# Patient Record
Sex: Female | Born: 1955 | Race: White | Hispanic: No | Smoking: Light tobacco smoker
Health system: Southern US, Community
[De-identification: ages and names within clinical notes are randomized; demographics above are authoritative.]

## PROBLEM LIST (undated history)

## (undated) DIAGNOSIS — R112 Nausea with vomiting, unspecified: Secondary | ICD-10-CM

## (undated) DIAGNOSIS — I2510 Atherosclerotic heart disease of native coronary artery without angina pectoris: Secondary | ICD-10-CM

## (undated) DIAGNOSIS — C50912 Malignant neoplasm of unspecified site of left female breast: Secondary | ICD-10-CM

## (undated) DIAGNOSIS — I509 Heart failure, unspecified: Secondary | ICD-10-CM

## (undated) DIAGNOSIS — Z8489 Family history of other specified conditions: Secondary | ICD-10-CM

## (undated) DIAGNOSIS — Z9889 Other specified postprocedural states: Secondary | ICD-10-CM

## (undated) DIAGNOSIS — I214 Non-ST elevation (NSTEMI) myocardial infarction: Secondary | ICD-10-CM

## (undated) HISTORY — PX: CARDIAC CATHETERIZATION: SHX172

## (undated) HISTORY — PX: GANGLION CYST EXCISION: SHX1691

## (undated) HISTORY — PX: PORTA CATH REMOVAL: CATH118286

---

## 1980-03-14 HISTORY — PX: TUBAL LIGATION: SHX77

## 1986-03-14 HISTORY — PX: VAGINAL HYSTERECTOMY: SUR661

## 2004-08-11 ENCOUNTER — Ambulatory Visit: Payer: Self-pay | Admitting: Family Medicine

## 2004-12-14 ENCOUNTER — Ambulatory Visit: Payer: Self-pay | Admitting: Family Medicine

## 2015-10-13 HISTORY — PX: PORTA CATH INSERTION: CATH118285

## 2015-10-13 HISTORY — PX: BREAST BIOPSY: SHX20

## 2015-10-30 DIAGNOSIS — C50912 Malignant neoplasm of unspecified site of left female breast: Secondary | ICD-10-CM | POA: Diagnosis not present

## 2015-11-21 DIAGNOSIS — R112 Nausea with vomiting, unspecified: Secondary | ICD-10-CM

## 2015-11-21 DIAGNOSIS — A047 Enterocolitis due to Clostridium difficile: Secondary | ICD-10-CM

## 2015-11-21 DIAGNOSIS — E876 Hypokalemia: Secondary | ICD-10-CM | POA: Diagnosis not present

## 2015-11-21 DIAGNOSIS — E871 Hypo-osmolality and hyponatremia: Secondary | ICD-10-CM | POA: Diagnosis not present

## 2015-11-21 DIAGNOSIS — R197 Diarrhea, unspecified: Secondary | ICD-10-CM | POA: Diagnosis not present

## 2015-11-22 DIAGNOSIS — R112 Nausea with vomiting, unspecified: Secondary | ICD-10-CM

## 2015-11-22 DIAGNOSIS — C50812 Malignant neoplasm of overlapping sites of left female breast: Secondary | ICD-10-CM

## 2015-11-22 DIAGNOSIS — E871 Hypo-osmolality and hyponatremia: Secondary | ICD-10-CM

## 2015-11-22 DIAGNOSIS — D701 Agranulocytosis secondary to cancer chemotherapy: Secondary | ICD-10-CM

## 2015-11-22 DIAGNOSIS — Z720 Tobacco use: Secondary | ICD-10-CM

## 2015-11-22 DIAGNOSIS — E876 Hypokalemia: Secondary | ICD-10-CM

## 2015-11-22 DIAGNOSIS — R197 Diarrhea, unspecified: Secondary | ICD-10-CM | POA: Diagnosis not present

## 2015-11-22 DIAGNOSIS — A047 Enterocolitis due to Clostridium difficile: Secondary | ICD-10-CM | POA: Diagnosis not present

## 2015-11-23 DIAGNOSIS — E876 Hypokalemia: Secondary | ICD-10-CM | POA: Diagnosis not present

## 2015-11-23 DIAGNOSIS — A047 Enterocolitis due to Clostridium difficile: Secondary | ICD-10-CM | POA: Diagnosis not present

## 2015-11-23 DIAGNOSIS — E871 Hypo-osmolality and hyponatremia: Secondary | ICD-10-CM | POA: Diagnosis not present

## 2015-11-23 DIAGNOSIS — R197 Diarrhea, unspecified: Secondary | ICD-10-CM | POA: Diagnosis not present

## 2016-01-01 DIAGNOSIS — E876 Hypokalemia: Secondary | ICD-10-CM | POA: Diagnosis not present

## 2016-01-01 DIAGNOSIS — C50812 Malignant neoplasm of overlapping sites of left female breast: Secondary | ICD-10-CM | POA: Diagnosis not present

## 2016-01-01 DIAGNOSIS — G4700 Insomnia, unspecified: Secondary | ICD-10-CM | POA: Diagnosis not present

## 2016-01-22 DIAGNOSIS — C50812 Malignant neoplasm of overlapping sites of left female breast: Secondary | ICD-10-CM | POA: Diagnosis not present

## 2016-01-22 DIAGNOSIS — E876 Hypokalemia: Secondary | ICD-10-CM | POA: Diagnosis not present

## 2016-02-12 DIAGNOSIS — C50812 Malignant neoplasm of overlapping sites of left female breast: Secondary | ICD-10-CM | POA: Diagnosis not present

## 2016-02-12 DIAGNOSIS — L270 Generalized skin eruption due to drugs and medicaments taken internally: Secondary | ICD-10-CM | POA: Diagnosis not present

## 2016-03-11 DIAGNOSIS — L270 Generalized skin eruption due to drugs and medicaments taken internally: Secondary | ICD-10-CM | POA: Diagnosis not present

## 2016-03-11 DIAGNOSIS — I890 Lymphedema, not elsewhere classified: Secondary | ICD-10-CM

## 2016-03-11 DIAGNOSIS — C50812 Malignant neoplasm of overlapping sites of left female breast: Secondary | ICD-10-CM | POA: Diagnosis not present

## 2016-03-11 DIAGNOSIS — R531 Weakness: Secondary | ICD-10-CM

## 2016-03-11 DIAGNOSIS — R269 Unspecified abnormalities of gait and mobility: Secondary | ICD-10-CM

## 2016-03-11 DIAGNOSIS — N390 Urinary tract infection, site not specified: Secondary | ICD-10-CM | POA: Diagnosis not present

## 2016-03-14 DIAGNOSIS — C50912 Malignant neoplasm of unspecified site of left female breast: Secondary | ICD-10-CM

## 2016-03-14 HISTORY — DX: Malignant neoplasm of unspecified site of left female breast: C50.912

## 2016-04-08 DIAGNOSIS — E876 Hypokalemia: Secondary | ICD-10-CM | POA: Diagnosis not present

## 2016-04-08 DIAGNOSIS — C50812 Malignant neoplasm of overlapping sites of left female breast: Secondary | ICD-10-CM | POA: Diagnosis not present

## 2016-04-08 DIAGNOSIS — R531 Weakness: Secondary | ICD-10-CM | POA: Diagnosis not present

## 2016-04-08 DIAGNOSIS — Z87440 Personal history of urinary (tract) infections: Secondary | ICD-10-CM

## 2016-04-08 DIAGNOSIS — I890 Lymphedema, not elsewhere classified: Secondary | ICD-10-CM | POA: Diagnosis not present

## 2016-04-08 DIAGNOSIS — R0602 Shortness of breath: Secondary | ICD-10-CM

## 2016-04-20 HISTORY — PX: MASTECTOMY: SHX3

## 2016-05-04 DIAGNOSIS — R110 Nausea: Secondary | ICD-10-CM | POA: Diagnosis not present

## 2016-05-04 DIAGNOSIS — I890 Lymphedema, not elsewhere classified: Secondary | ICD-10-CM | POA: Diagnosis not present

## 2016-05-04 DIAGNOSIS — Z171 Estrogen receptor negative status [ER-]: Secondary | ICD-10-CM

## 2016-05-04 DIAGNOSIS — E876 Hypokalemia: Secondary | ICD-10-CM | POA: Diagnosis not present

## 2016-05-04 DIAGNOSIS — C50812 Malignant neoplasm of overlapping sites of left female breast: Secondary | ICD-10-CM | POA: Diagnosis not present

## 2016-05-04 DIAGNOSIS — Z9012 Acquired absence of left breast and nipple: Secondary | ICD-10-CM | POA: Diagnosis not present

## 2016-08-02 ENCOUNTER — Telehealth: Payer: Self-pay | Admitting: Cardiovascular Disease

## 2016-08-02 DIAGNOSIS — E786 Lipoprotein deficiency: Secondary | ICD-10-CM

## 2016-08-02 DIAGNOSIS — R062 Wheezing: Secondary | ICD-10-CM

## 2016-08-02 DIAGNOSIS — J189 Pneumonia, unspecified organism: Secondary | ICD-10-CM

## 2016-08-02 DIAGNOSIS — C50919 Malignant neoplasm of unspecified site of unspecified female breast: Secondary | ICD-10-CM

## 2016-08-02 NOTE — Telephone Encounter (Signed)
Received records from [facility] for appointment on 08/04/16 with Dr Gwenlyn Found.  Records put with Dr Kennon Holter schedule for 08/04/16. lp

## 2016-08-03 DIAGNOSIS — E876 Hypokalemia: Secondary | ICD-10-CM

## 2016-08-03 DIAGNOSIS — I509 Heart failure, unspecified: Secondary | ICD-10-CM | POA: Diagnosis not present

## 2016-08-03 DIAGNOSIS — R740 Nonspecific elevation of levels of transaminase and lactic acid dehydrogenase [LDH]: Secondary | ICD-10-CM

## 2016-08-03 DIAGNOSIS — C50919 Malignant neoplasm of unspecified site of unspecified female breast: Secondary | ICD-10-CM | POA: Diagnosis not present

## 2016-08-03 DIAGNOSIS — E786 Lipoprotein deficiency: Secondary | ICD-10-CM | POA: Diagnosis not present

## 2016-08-03 DIAGNOSIS — C50812 Malignant neoplasm of overlapping sites of left female breast: Secondary | ICD-10-CM

## 2016-08-03 DIAGNOSIS — J189 Pneumonia, unspecified organism: Secondary | ICD-10-CM | POA: Diagnosis not present

## 2016-08-03 DIAGNOSIS — J9600 Acute respiratory failure, unspecified whether with hypoxia or hypercapnia: Secondary | ICD-10-CM

## 2016-08-03 DIAGNOSIS — J188 Other pneumonia, unspecified organism: Secondary | ICD-10-CM

## 2016-08-03 DIAGNOSIS — R062 Wheezing: Secondary | ICD-10-CM | POA: Diagnosis not present

## 2016-08-04 ENCOUNTER — Ambulatory Visit: Payer: BLUE CROSS/BLUE SHIELD | Admitting: Cardiovascular Disease

## 2016-08-04 DIAGNOSIS — J189 Pneumonia, unspecified organism: Secondary | ICD-10-CM | POA: Diagnosis not present

## 2016-08-04 DIAGNOSIS — R062 Wheezing: Secondary | ICD-10-CM | POA: Diagnosis not present

## 2016-08-04 DIAGNOSIS — E786 Lipoprotein deficiency: Secondary | ICD-10-CM | POA: Diagnosis not present

## 2016-08-04 DIAGNOSIS — C50919 Malignant neoplasm of unspecified site of unspecified female breast: Secondary | ICD-10-CM | POA: Diagnosis not present

## 2016-08-05 DIAGNOSIS — I509 Heart failure, unspecified: Secondary | ICD-10-CM

## 2016-08-05 DIAGNOSIS — C50919 Malignant neoplasm of unspecified site of unspecified female breast: Secondary | ICD-10-CM | POA: Diagnosis not present

## 2016-08-05 DIAGNOSIS — R062 Wheezing: Secondary | ICD-10-CM | POA: Diagnosis not present

## 2016-08-05 DIAGNOSIS — E786 Lipoprotein deficiency: Secondary | ICD-10-CM | POA: Diagnosis not present

## 2016-08-05 DIAGNOSIS — Z9012 Acquired absence of left breast and nipple: Secondary | ICD-10-CM

## 2016-08-05 DIAGNOSIS — Z9221 Personal history of antineoplastic chemotherapy: Secondary | ICD-10-CM

## 2016-08-05 DIAGNOSIS — C50812 Malignant neoplasm of overlapping sites of left female breast: Secondary | ICD-10-CM | POA: Diagnosis not present

## 2016-08-05 DIAGNOSIS — J189 Pneumonia, unspecified organism: Secondary | ICD-10-CM | POA: Diagnosis not present

## 2016-08-06 DIAGNOSIS — J189 Pneumonia, unspecified organism: Secondary | ICD-10-CM | POA: Diagnosis not present

## 2016-08-06 DIAGNOSIS — C50919 Malignant neoplasm of unspecified site of unspecified female breast: Secondary | ICD-10-CM | POA: Diagnosis not present

## 2016-08-06 DIAGNOSIS — R062 Wheezing: Secondary | ICD-10-CM | POA: Diagnosis not present

## 2016-08-06 DIAGNOSIS — E786 Lipoprotein deficiency: Secondary | ICD-10-CM | POA: Diagnosis not present

## 2016-08-15 DIAGNOSIS — E876 Hypokalemia: Secondary | ICD-10-CM | POA: Diagnosis not present

## 2016-08-15 DIAGNOSIS — R634 Abnormal weight loss: Secondary | ICD-10-CM | POA: Diagnosis not present

## 2016-08-15 DIAGNOSIS — E871 Hypo-osmolality and hyponatremia: Secondary | ICD-10-CM | POA: Diagnosis not present

## 2016-08-15 DIAGNOSIS — C50912 Malignant neoplasm of unspecified site of left female breast: Secondary | ICD-10-CM

## 2016-08-15 DIAGNOSIS — I890 Lymphedema, not elsewhere classified: Secondary | ICD-10-CM | POA: Diagnosis not present

## 2016-08-15 DIAGNOSIS — I509 Heart failure, unspecified: Secondary | ICD-10-CM | POA: Diagnosis not present

## 2016-08-15 DIAGNOSIS — A0472 Enterocolitis due to Clostridium difficile, not specified as recurrent: Secondary | ICD-10-CM | POA: Diagnosis not present

## 2016-08-15 DIAGNOSIS — Z171 Estrogen receptor negative status [ER-]: Secondary | ICD-10-CM | POA: Diagnosis not present

## 2016-08-19 ENCOUNTER — Ambulatory Visit (INDEPENDENT_AMBULATORY_CARE_PROVIDER_SITE_OTHER): Payer: BLUE CROSS/BLUE SHIELD | Admitting: Cardiovascular Disease

## 2016-08-19 ENCOUNTER — Encounter: Payer: Self-pay | Admitting: Cardiovascular Disease

## 2016-08-19 VITALS — BP 114/78 | HR 104 | Ht 63.0 in | Wt 99.4 lb

## 2016-08-19 DIAGNOSIS — R0609 Other forms of dyspnea: Secondary | ICD-10-CM | POA: Diagnosis not present

## 2016-08-19 DIAGNOSIS — I428 Other cardiomyopathies: Secondary | ICD-10-CM | POA: Diagnosis not present

## 2016-08-19 DIAGNOSIS — Z720 Tobacco use: Secondary | ICD-10-CM | POA: Insufficient documentation

## 2016-08-19 MED ORDER — CARVEDILOL 3.125 MG PO TABS: 3.1250 mg | ORAL_TABLET | Freq: Two times a day (BID) | ORAL | 6 refills | Status: DC

## 2016-08-19 NOTE — Patient Instructions (Signed)
Medication Instructions: START Carvedilol 3.125 mg twice daily.   Testing/Procedures: Your physician has requested that you have an echocardiogram. Echocardiography is a painless test that uses sound waves to create images of your heart. It provides your doctor with information about the size and shape of your heart and how well your heart's chambers and valves are working. This procedure takes approximately one hour. There are no restrictions for this procedure.  Follow-Up: You have been referred to CHF Clinic with Dr. Nicholas Lose weeks. They will call you to schedule this appointment.  Your physician recommends that you schedule a follow-up appointment in: 1 month with PharmD to tirate your Carvedilol.   Your physician recommends that you schedule a follow-up appointment in: 3 months with Dr. Gwenlyn Found.  If you need a refill on your cardiac medications before your next appointment, please call your pharmacy.

## 2016-08-19 NOTE — Progress Notes (Signed)
08/19/2016 Ward Givens   1955-12-26  [id number]  Primary Physician Garlon Hatchet, Jaymes Graff, MD Primary Cardiologist: Lorretta Harp MD Renae Gloss  HPI:  Robin Rose is a 61 year old thin-appearing divorced Caucasian female mother of 2, mother of 2 grandchildren who was referred for diagnosis and treatment of probable nonischemic cardiomyopathy. She has no cardiac risk factors although her parents to have coronary artery disease. She was diagnosed with triple-negative breast cancer a year ago and had left mastectomy. Her EF 11/05/15 the city of 55% and by MUGA 04/06/16 was 69%. She is admitted to the hospital 2 weeks ago with orthopnea and heart failure. She was diuresed 8 pounds. Her EF was 20-25% on 08/03/16. She feels clinically improved today. She does weigh herself on a daily basis   Current Outpatient Prescriptions  Medication Sig Dispense Refill  . potassium chloride (KLOR-CON 10) 10 MEQ tablet Take 10 mEq by mouth daily.    . Probiotic Product (PROBIOTIC ADVANCED PO) Take by mouth.    . carvedilol (COREG) 3.125 MG tablet Take 1 tablet (3.125 mg total) by mouth 2 (two) times daily with a meal. 30 tablet 6   No current facility-administered medications for this visit.     Allergies  Allergen Reactions  . Codeine     Other reaction(s): GI Upset (intolerance) Unknown    Social History   Social History  . Marital status: Divorced    Spouse name: N/A  . Number of children: N/A  . Years of education: N/A   Occupational History  . Not on file.   Social History Main Topics  . Smoking status: Current Every Day Smoker  . Smokeless tobacco: Never Used  . Alcohol use Not on file  . Drug use: Unknown  . Sexual activity: Not on file   Other Topics Concern  . Not on file   Social History Narrative  . No narrative on file     Review of Systems: General: negative for chills, fever, night sweats or weight changes.  Cardiovascular: negative for chest pain,  dyspnea on exertion, edema, orthopnea, palpitations, paroxysmal nocturnal dyspnea or shortness of breath Dermatological: negative for rash Respiratory: negative for cough or wheezing Urologic: negative for hematuria Abdominal: negative for nausea, vomiting, diarrhea, bright red blood per rectum, melena, or hematemesis Neurologic: negative for visual changes, syncope, or dizziness All other systems reviewed and are otherwise negative except as noted above.    Blood pressure 114/78, pulse (!) 104, height 5\' 3"  (1.6 m), weight 99 lb 6.4 oz (45.1 kg).  General appearance: alert and no distress Neck: no adenopathy, no carotid bruit, no JVD, supple, symmetrical, trachea midline and thyroid not enlarged, symmetric, no tenderness/mass/nodules Lungs: clear to auscultation bilaterally Heart: Positive S3 summation gallop Extremities: extremities normal, atraumatic, no cyanosis or edema  EKG not performed today  ASSESSMENT AND PLAN:   Tobacco abuse History of 40-pack-years of tobacco abuse continued to smoke one pack per day recalcitrant to risk factor modification  Nonischemic cardiomyopathy (HCC) History of triple-negative breast cancer status post left breast mastectomy, 30 rounds of radiation therapy and chemotherapy. She did get himself 480 g of Adriamycin. Her ejection fraction by 2-D echo 11/05/15 was 50-55% and by MUGA 04/06/16 was 69%. She was recently hospitalized in the hospital for congestive heart failure and was diuresed 8 L. By 2-D echo was 20-25% on 08/03/16. We have talked about salt restriction. She weighs herself daily and emesis remained stable. I'm going to begin her  on low-dose carvedilol and we'll titrate this as an outpatient. She does have a summation gallop on exam. I'm referring her to our heart failure clinic for further evaluation and treatment.      Lorretta Harp MD FACP,FACC,FAHA, [facility] 08/19/2016 11:09 AM

## 2016-08-19 NOTE — Assessment & Plan Note (Signed)
History of 40-pack-years of tobacco abuse continued to smoke one pack per day recalcitrant to risk factor modification

## 2016-08-19 NOTE — Assessment & Plan Note (Signed)
History of triple-negative breast cancer status post left breast mastectomy, 30 rounds of radiation therapy and chemotherapy. She did get himself 480 g of Adriamycin. Her ejection fraction by 2-D echo 11/05/15 was 50-55% and by MUGA 04/06/16 was 69%. She was recently hospitalized in the hospital for congestive heart failure and was diuresed 8 L. By 2-D echo was 20-25% on 08/03/16. We have talked about salt restriction. She weighs herself daily and emesis remained stable. I'm going to begin her on low-dose carvedilol and we'll titrate this as an outpatient. She does have a summation gallop on exam. I'm referring her to our heart failure clinic for further evaluation and treatment.

## 2016-08-29 ENCOUNTER — Encounter (HOSPITAL_COMMUNITY): Payer: Self-pay | Admitting: Internal Medicine

## 2016-08-29 ENCOUNTER — Ambulatory Visit (HOSPITAL_COMMUNITY)
Admission: RE | Admit: 2016-08-29 | Discharge: 2016-08-29 | Disposition: A | Discharge status: Home / Self Care | Payer: BLUE CROSS/BLUE SHIELD | Source: Ambulatory Visit | Attending: Internal Medicine | Admitting: Internal Medicine

## 2016-08-29 VITALS — BP 115/74 | HR 103 | Wt 99.5 lb

## 2016-08-29 DIAGNOSIS — I5022 Chronic systolic (congestive) heart failure: Secondary | ICD-10-CM

## 2016-08-29 DIAGNOSIS — Z841 Family history of disorders of kidney and ureter: Secondary | ICD-10-CM | POA: Diagnosis not present

## 2016-08-29 DIAGNOSIS — Z9889 Other specified postprocedural states: Secondary | ICD-10-CM | POA: Insufficient documentation

## 2016-08-29 DIAGNOSIS — Z8249 Family history of ischemic heart disease and other diseases of the circulatory system: Secondary | ICD-10-CM | POA: Insufficient documentation

## 2016-08-29 DIAGNOSIS — C50912 Malignant neoplasm of unspecified site of left female breast: Secondary | ICD-10-CM

## 2016-08-29 DIAGNOSIS — Z8051 Family history of malignant neoplasm of kidney: Secondary | ICD-10-CM | POA: Insufficient documentation

## 2016-08-29 DIAGNOSIS — F17200 Nicotine dependence, unspecified, uncomplicated: Secondary | ICD-10-CM | POA: Insufficient documentation

## 2016-08-29 DIAGNOSIS — Z9012 Acquired absence of left breast and nipple: Secondary | ICD-10-CM | POA: Insufficient documentation

## 2016-08-29 DIAGNOSIS — Z171 Estrogen receptor negative status [ER-]: Secondary | ICD-10-CM | POA: Diagnosis not present

## 2016-08-29 DIAGNOSIS — Z885 Allergy status to narcotic agent status: Secondary | ICD-10-CM | POA: Diagnosis not present

## 2016-08-29 DIAGNOSIS — Z803 Family history of malignant neoplasm of breast: Secondary | ICD-10-CM | POA: Insufficient documentation

## 2016-08-29 LAB — BASIC METABOLIC PANEL
Anion gap: 10 (ref 5–15)
BUN: 8 mg/dL (ref 6–20)
CALCIUM: 9.2 mg/dL (ref 8.9–10.3)
CO2: 24 mmol/L (ref 22–32)
CREATININE: 0.67 mg/dL (ref 0.44–1.00)
Chloride: 102 mmol/L (ref 101–111)
GFR calc non Af Amer: >60 mL/min (ref >60)
Glucose, Bld: 98 mg/dL (ref 65–99)
Potassium: 4.2 mmol/L (ref 3.5–5.1)
SODIUM: 136 mmol/L (ref 135–145)

## 2016-08-29 MED ORDER — DIGOXIN 125 MCG PO TABS: 0.1250 mg | ORAL_TABLET | Freq: Every day | ORAL | 3 refills | Status: DC

## 2016-08-29 MED ORDER — SPIRONOLACTONE 25 MG PO TABS: 12.5000 mg | ORAL_TABLET | Freq: Every day | ORAL | 3 refills | Status: DC

## 2016-08-29 NOTE — Patient Instructions (Signed)
Labs today (will call for abnormal results, otherwise no news is good news)  START taking Spironolactone 12.5 mg (0.5 Tablet) Once Daily  START taking Digoxin 0.125 mg (1 Tablet) Once Daily  Follow up with Doroteo Bradford, PharmD in 2 weeks  Follow up with Dr. Haroldine Laws in 2 months

## 2016-08-29 NOTE — Progress Notes (Signed)
ADVANCED HF CLINIC CONSULT NOTE  Referring Physician: Gwenlyn Found  Primary Care: Dr. Garlon Hatchet  Primary Cardiologist: Gwenlyn Found   HPI:  [name] is a 61 year old woman with h/o tobacco abuse, breast cancer (s/p chemo and left mastectomy) and recently diagnosed systolic HF who is referred by Dr. Gwenlyn Found for further evaluation of HF.   In 8/17 she was diagnosed with triple-negative breast cancer  with lymph node involvement. She had a clinically staged T4b, N1, M0 cancer which was stage IIIB. Her tumor was ER negative, PR negative and Her-2/neu negative. Her Ki67 was 30%. She underwent neoadjuvant chemotherapy including adriamycin (6 cycles)  followed by left mastectomy in 2/18 and XRT (completed 4/18).   Her EF 11/05/15 was 55% and by MUGA 04/06/16 was 69%. She was admitted to [facility] 08/02/16  with orthopnea and heart failure. She was diuresed 8 pounds. Her EF was 20-25% by echo on 08/03/16. RV was said to be normal. She saw Dr. Gwenlyn Found on 08/19/16. She was started on low-dose carvedilol. Lisinopril and lasix stopped by Dr. Garlon Hatchet on 08/15/16 due to low BP.   Feels fatigued. Mild edema. Takes lasix as needed. SOB with mild exertion. +mild orthopnea. No CP or pressure. Still with periods of dizziness. Says carvedilol does not make her feel worse.    Review of Systems: [y] = yes, _0  = no   General: Weight gain _1 ; Weight loss _2 ; Anorexia _3 ; Fatigue Blue.Reese ]; Fever _4 ; Chills _5 ; Weakness _6   Cardiac: Chest pain/pressure _7 ; Resting SOB _8 ; Exertional SOB _9 ; Orthopnea _10 ; Pedal Edema _11 ; Palpitations _12 ; Syncope _13 ; Presyncope _14 ; Paroxysmal nocturnal dyspnea_15   Pulmonary: Cough _16 ; Wheezing_17 ; Hemoptysis_18 ; Sputum _19 ; Snoring _20   GI: Vomiting_21 ; Dysphagia_22 ; Melena_23 ; Hematochezia _24 ; Heartburn_25 ; Abdominal pain _26 ; Constipation _27 ; Diarrhea _28 ; BRBPR _29   GU: Hematuria_30 ; Dysuria _31 ; Nocturia_32   Vascular: Pain in legs with walking _33 ; Pain in feet with lying flat _34 ;  Non-healing sores _35 ; Stroke _36 ; TIA _37 ; Slurred speech _38 ;  Neuro: Headaches_39 ; Vertigo_40 ; Seizures_41 ; Paresthesias_42 ;Blurred vision _43 ; Diplopia _44 ; Vision changes _45   Ortho/Skin: Arthritis _46 ; Joint pain _47 ; Muscle pain _48 ; Joint swelling _49 ; Back Pain _50 ; Rash _51   Psych: Depression_52 ; Anxiety_53   Heme: Bleeding problems _54 ; Clotting disorders _55 ; Anemia _56   Endocrine: Diabetes _57 ; Thyroid dysfunction_58   PMHx:  . Allergic rhinitis 08/15/2016  . Chronic systolic heart failure ([place]) 08/15/2016  . Malignant neoplasm of overlapping sites of left female breast ([place]) 10/19/2015       Current Outpatient Prescriptions  Medication Sig Dispense Refill  . carvedilol (COREG) 3.125 MG tablet Take 1 tablet (3.125 mg total) by mouth 2 (two) times daily with a meal. 60 tablet 6  . furosemide (LASIX) 20 MG tablet Take 20 mg by mouth daily as needed.    . potassium chloride SA (K-DUR,KLOR-CON) 20 MEQ tablet Take 80 mEq by mouth daily.    . Probiotic Product (PROBIOTIC ADVANCED PO) Take by mouth.     No current facility-administered medications for this encounter.     Allergies  Allergen Reactions  . Codeine     Other reaction(s): GI Upset (intolerance) Unknown      Social History   Social  History  . Marital status: Divorced    Spouse name: N/A  . Number of children: N/A  . Years of education: N/A   Occupational History  . Not on file.   Social History Main Topics  . Smoking status: Current Every Day Smoker  . Smokeless tobacco: Never Used  . Alcohol use Not on file  . Drug use: Unknown  . Sexual activity: Not on file   Other Topics Concern  . Not on file   Social History Narrative  . No narrative on file    Family History   . Heart disease Mother - + CAD with CABG at age 37 . Kidney cancer Mother  . Heart disease Father  - CABG and AVR in 62s . Breast cancer Paternal Aunt  . Breast cancer Cousin  . Colon cancer Neg Hx    - 3 sisters - no CAD        Vitals:   08/29/16 1457  BP: 115/74  Pulse: (!) 103  SpO2: 100%  Weight: 99 lb 8 oz (45.1 kg)    PHYSICAL EXAM: General:  Thin. Well appearing. No respiratory difficulty HEENT: normal Neck: supple. JVP 5. Carotids 2+ bilat; no bruits. No lymphadenopathy or thryomegaly appreciated. Cor: PMI nondisplaced. Tach regular +3s Lungs: clear Abdomen: soft, nontender, nondistended. No hepatosplenomegaly. No bruits or masses. Good bowel sounds. Extremities: no cyanosis, clubbing, rash, mild lymphedema LUE otherwise no edema.  Neuro: alert & oriented x 3, cranial nerves grossly intact. moves all 4 extremities w/o difficulty. Affect pleasant.  ECG (08/02/16): Sinus tach 105. Inferior q waves. Nonspecific ST abnormalities Personally reviewed  Labs: 08/15/16: K 3.3 Cr 0.6   ASSESSMENT & PLAN: 1. Chronic systolic HF - Echo 6/55 EF 20-25%. Likely adriamycin induced but cannot exclude ischemic with h/o tobacco use. Given tobacco history and Q waves on ECG will need R/L heart cath however she would like to hold off for now - NYHA III. Volume status ok - Add digoxin 0.125 and spiro 12.5 - Continue carvedilol as tolerated - Can take lasix as needed for edema  - F/u PharmD clinic in 2 weeks for med titration and possible re-initiation of losartan 12.5 qhs - F/u with me 1-2 months  2. 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 S/p Adjuvant chemotherapy followed by left mastectomy and LN dissection 2/18  3. Tobacco use - Encouraged cessation   [name], MD  3:22 PM

## 2016-08-29 NOTE — Addendum Note (Signed)
Encounter addended by: Kennieth Rad, RN on: 08/29/2016  3:47 PM<BR>    Actions taken: Diagnosis association updated, Order list changed, Sign clinical note

## 2016-09-08 ENCOUNTER — Telehealth (HOSPITAL_COMMUNITY): Payer: Self-pay | Admitting: Pharmacist

## 2016-09-08 ENCOUNTER — Ambulatory Visit (HOSPITAL_COMMUNITY)
Admission: RE | Admit: 2016-09-08 | Discharge: 2016-09-08 | Disposition: A | Discharge status: Home / Self Care | Payer: BLUE CROSS/BLUE SHIELD | Source: Ambulatory Visit | Attending: Cardiology | Admitting: Cardiology

## 2016-09-08 VITALS — BP 116/72 | HR 85 | Wt 98.6 lb

## 2016-09-08 DIAGNOSIS — I5022 Chronic systolic (congestive) heart failure: Secondary | ICD-10-CM | POA: Insufficient documentation

## 2016-09-08 DIAGNOSIS — I428 Other cardiomyopathies: Secondary | ICD-10-CM

## 2016-09-08 DIAGNOSIS — F17210 Nicotine dependence, cigarettes, uncomplicated: Secondary | ICD-10-CM | POA: Diagnosis not present

## 2016-09-08 DIAGNOSIS — C50912 Malignant neoplasm of unspecified site of left female breast: Secondary | ICD-10-CM | POA: Insufficient documentation

## 2016-09-08 DIAGNOSIS — Z9221 Personal history of antineoplastic chemotherapy: Secondary | ICD-10-CM | POA: Insufficient documentation

## 2016-09-08 LAB — BASIC METABOLIC PANEL
Anion gap: 5 (ref 5–15)
BUN: 5 mg/dL — AB (ref 6–20)
CHLORIDE: 102 mmol/L (ref 101–111)
CO2: 32 mmol/L (ref 22–32)
CREATININE: 0.63 mg/dL (ref 0.44–1.00)
Calcium: 9.5 mg/dL (ref 8.9–10.3)
GFR calc Af Amer: >60 mL/min (ref >60)
GLUCOSE: 78 mg/dL (ref 65–99)
POTASSIUM: 3.4 mmol/L — AB (ref 3.5–5.1)
Sodium: 139 mmol/L (ref 135–145)

## 2016-09-08 LAB — BRAIN NATRIURETIC PEPTIDE: B NATRIURETIC PEPTIDE 5: 465 pg/mL — AB (ref 0.0–100.0)

## 2016-09-08 LAB — DIGOXIN LEVEL: DIGOXIN LVL: 1.9 ng/mL (ref 0.8–2.0)

## 2016-09-08 MED ORDER — DIGOXIN 125 MCG PO TABS: 0.0625 mg | ORAL_TABLET | Freq: Every day | ORAL | 3 refills | Status: DC

## 2016-09-08 MED ORDER — LOSARTAN POTASSIUM 25 MG PO TABS: 12.5000 mg | ORAL_TABLET | Freq: Every day | ORAL | 5 refills | Status: DC

## 2016-09-08 NOTE — Telephone Encounter (Signed)
Called patient to inform her that her potassium was slightly low at 3.4 and her digoxin level (drawn appropriately) was above goal at 1.9.   PLAN:  No digoxin tonight, restart tomorrow (6/29) at 0.0625 mg once daily Take KCl 40 mEq x1 today Continue other medications as advised Will follow up BMET and dig level on 09/19/16 pharmacy appointment.   Carlean Jews, Pharm.D. PGY1 Pharmacy Resident 6/28/20184:39 PM Pager [phone]

## 2016-09-08 NOTE — Progress Notes (Signed)
MD: Dr. Haroldine Laws  HPI:  Robin Rose is a pleasant 61 year old caucasian woman with h/o tobacco abuse, breast cancer (s/p chemo and left mastectomy) and recently diagnosed systolic HF who is referred by Dr. Gwenlyn Found for further evaluation of HF.   In 10/2015 she was diagnosed with triple-negative breast cancer  with lymph node involvement. She underwent neoadjuvant chemotherapy including adriamycin (6 cycles)  followed by left mastectomy in 04/2016 and XRT (completed 4/18).   Her EF 11/05/15 was 55% and by MUGA 04/06/16 was 69%. She was admitted to [facility] 08/02/16  with orthopnea and heart failure. She was diuresed 8 pounds. Her EF was 20-25% by echo on 08/03/16. RV was said to be normal. She saw Dr. Gwenlyn Found on 08/19/16, was started on low-dose carvedilol. On 09/04/16, her lisinopril and lasix stopped by Dr. Garlon Hatchet due to low BP.   On last clinic visit with Dr. Haroldine Laws on 08/29/16, patient had SOB with mild exertion and mild orthopnea. Initiated digoxin 0.125 mg daily, spironolactone 12.5 mg daily, and furosemide 20 mg as needed for edema.     Today, she presents for pharmacist-led medication optimization with her sister and brings a medication list. Patient endorses good adherence to her medication regimen, not requiring lasix at this time. Patient endorses a history of requiring aggressive potassium repletion however is not taking any KCl at this time. Patient reports DOE while doing household chores, and some lightheadedness when standing after sitting. Patient reports having an episode of not being able to get up in the grocery store due to weakness, denies dizziness during that episode. She reports having early satiety and some nausea when eating, which mimics the symptoms she experienced when she was fluid overloaded in May, however weight has been stable and patient appears euvolemic on exam.  She reports continued efforts to quit smoking, now down to 1/2 PPD (from 1-2 PPD). Patient states she is  drinking <2L/day and adhering to low sodium diet.     . Shortness of breath/dyspnea on exertion? yes - DOE for household chores, stable . Orthopnea/PND? Yes - sleeps with elevated HOB, stable . Edema? no . Lightheadedness/dizziness? Yes - when standing after sitting . Daily weights at home? Yes, stable ~98-99 lbs . Blood pressure/heart rate monitoring at home? Yes - SBP 90-110s . Following low-sodium/fluid-restricted diet? yes  HF Medications: Carvedilol 3.125 mg tablet BID Digoxin 0.125 mg tablet daily Potassium chloride 10 mEq (4 tablets) when takes furosemide Furosemide 20 mg tablet daily as needed (not taken in past couple of weeks) Spironolactone 12.5 mg daily  Has the patient been experiencing any side effects to the medications prescribed?  Yes - some dizziness  Does the patient have any problems obtaining medications due to transportation or finances?   No - BCBS commercial  Understanding of regimen: good Understanding of indications: good Potential of compliance: excellent Patient understands to avoid NSAIDs. Patient understands to avoid decongestants.    Pertinent Lab Values: . 09/08/2016 Serum creatinine 0.63 (BL~0.6-0.7), CO2 32, Potassium 3.4, Sodium 138, BNP 465, Digoxin 1.9 (last dose on 6/27 at 9 pm)  Vital Signs: . Weight: 98.6 lbs (dry weight: 98-99 lbs) . Blood pressure: 116/72 mmHg  . Heart rate: 85 bpm   Assessment: 1. Chronicsystolic CHF (EF 67-61% on 5/18) likely 2/2 adriamycin but cannot exclude ischemic with h/o tobacco use.  - NYHA class III symptoms - Patient euvolemic on exam  - Initiate losartan 12.5 mg daily. Advised to take in the evening to avoid symptoms of hypotension.  -  Hold digoxin today and restart tomorrow at 0.0625 mg daily and take KCl 40 mEq x 1 today  - Continue spironolactone 12.5 mg daily, carvedilol 3.125 mg BID, furosemide 20 mg daily as needed and potassium 10 mEq (4 tablets) when taking furosemide.    - Basic disease state  pathophysiology, medication indication, mechanism and side effects reviewed at length with patient and she verbalized understanding.  2. Left breast cancer - Triple negative. S/p Adjuvant chemotherapy followed by left mastectomy and LN dissection 2/18 and XRT (completed 4/18) 3. Tobacco use - Patient reports smoking 1/2 PPD - Encouraged smoking cessation   Plan: 1) Medication changes: Based on clinical presentation, vital signs and recent labs will initiate losartan 12.5 mg tablet QHS, decrease digoxin to 0.0625 mg daily starting tomorrow and take KCl 40 mEq x 1 tonight. Advised patient to take carvedilol 3.125 mg BID with food.  2) Labs: BMET, BNP and dig level today  3) Follow-up: 09/19/2016 Pharmacy Clinic and BMET/dig  Patient seen with:  Robin Rose, PharmD, PGY1 Pharmacy Resident Robin Rose, PharmD Candidate    Robin Rose. Velva Harman, PharmD, BCPS, CPP Clinical Pharmacist Pager: [phone] Phone: [phone] 09/08/2016 1:33 PM

## 2016-09-08 NOTE — Patient Instructions (Addendum)
Thanks for coming in to see [place] today!   START taking losartan 12.5 mg (1/2 tablet) once a day. Take this at night.   Continue all of your other medications as you are taking them.  Take your carvedilol with food to reduce dizziness.   We will be drawing labs today and will call you with any issues.   We will see you again on 7/9 to adjust medications and draw labs.

## 2016-09-09 ENCOUNTER — Telehealth (HOSPITAL_COMMUNITY): Payer: Self-pay

## 2016-09-09 NOTE — Telephone Encounter (Addendum)
Mrs. Pickron called this morning to let us know that this morning she feels very lightheaded and dizzy, with a reported BP of 98/58, after starting her new losartan 12.5 mg last night. Since her digoxin level was 1.9, she was instructed to hold her digoxin dose yesterday and start taking 0.0625 mg every day starting today. Instructed patient to hold her losartan a couple of days while she starts taking her new low dose of digoxin, and then to re start her losartan, to see if she can tolerate the losartan after her digoxin levels have a chance to come back down. Patient endorsed understanding with plan.   [place] PharmD Candidate    Agree with above.  Ruta Hinds. Velva Harman, PharmD, BCPS, CPP Clinical Pharmacist Pager: [phone] Phone: [phone] 09/09/2016 1:48 PM

## 2016-09-19 ENCOUNTER — Ambulatory Visit (HOSPITAL_COMMUNITY)
Admission: RE | Admit: 2016-09-19 | Discharge: 2016-09-19 | Disposition: A | Discharge status: Home / Self Care | Payer: BLUE CROSS/BLUE SHIELD | Source: Ambulatory Visit | Attending: Internal Medicine | Admitting: Internal Medicine

## 2016-09-19 ENCOUNTER — Ambulatory Visit: Payer: BLUE CROSS/BLUE SHIELD

## 2016-09-19 VITALS — BP 112/76 | HR 79 | Wt 98.0 lb

## 2016-09-19 DIAGNOSIS — Z9012 Acquired absence of left breast and nipple: Secondary | ICD-10-CM | POA: Insufficient documentation

## 2016-09-19 DIAGNOSIS — Z9221 Personal history of antineoplastic chemotherapy: Secondary | ICD-10-CM | POA: Insufficient documentation

## 2016-09-19 DIAGNOSIS — F17210 Nicotine dependence, cigarettes, uncomplicated: Secondary | ICD-10-CM | POA: Insufficient documentation

## 2016-09-19 DIAGNOSIS — Z853 Personal history of malignant neoplasm of breast: Secondary | ICD-10-CM | POA: Diagnosis not present

## 2016-09-19 DIAGNOSIS — I5022 Chronic systolic (congestive) heart failure: Secondary | ICD-10-CM | POA: Insufficient documentation

## 2016-09-19 DIAGNOSIS — I428 Other cardiomyopathies: Secondary | ICD-10-CM

## 2016-09-19 LAB — BASIC METABOLIC PANEL
ANION GAP: 10 (ref 5–15)
BUN: 7 mg/dL (ref 6–20)
CALCIUM: 9.5 mg/dL (ref 8.9–10.3)
CHLORIDE: 99 mmol/L — AB (ref 101–111)
CO2: 28 mmol/L (ref 22–32)
CREATININE: 0.59 mg/dL (ref 0.44–1.00)
GFR calc non Af Amer: >60 mL/min (ref >60)
Glucose, Bld: 92 mg/dL (ref 65–99)
Potassium: 3.7 mmol/L (ref 3.5–5.1)
SODIUM: 137 mmol/L (ref 135–145)

## 2016-09-19 LAB — DIGOXIN LEVEL

## 2016-09-19 LAB — BRAIN NATRIURETIC PEPTIDE: B NATRIURETIC PEPTIDE 5: 413.5 pg/mL — AB (ref 0.0–100.0)

## 2016-09-19 MED ORDER — VARENICLINE TARTRATE 1 MG PO TABS: 1.0000 mg | ORAL_TABLET | Freq: Two times a day (BID) | ORAL | 5 refills | Status: DC

## 2016-09-19 MED ORDER — VARENICLINE TARTRATE 0.5 MG X 11 & 1 MG X 42 PO MISC: ORAL | 0 refills | Status: DC

## 2016-09-19 NOTE — Progress Notes (Signed)
HF MD: BENSIMHON   HPI:  Robin Rose is a pleasant 61 year old caucasian woman with h/o tobacco abuse, breast cancer (s/p chemo and left mastectomy) and recently diagnosed systolic HF who is referred by Dr. Gwenlyn Found for further evaluation of HF.   In 10/2015 she was diagnosed with triple-negative breast cancer with lymph node involvement. She underwent neoadjuvant chemotherapy including adriamycin (6 cycles) followed byleft mastectomy in 04/2016 and XRT (completed 4/18).   Her EF 11/05/15 was55% and by MUGA 04/06/16 was 69%. She was admitted to [facility] 5/22/18with orthopnea and heart failure. She was diuresed 8 pounds. Her EF was 20-25% by echo on 08/03/16. RV was said to be normal. She saw Dr. Gwenlyn Found on 08/19/16, was started on low-dose carvedilol. On 09/04/16, her lisinopril and lasix stopped by Dr. Garlon Hatchet due to low BP.     Today, she presents for pharmacist-led medication optimization with her sister. At last pharmacy visit, she was started on losartan 12.5 mg daily and her digoxin was reduced to 0.0625 mg daily. Patient endorses good adherence to her medication regimen, not requiring lasix at this time. She states that since starting losartan QHS, her BP has been low and she has felt very dizzy and tired throughout the day. She reports continued efforts to quit smoking, now down to 1/2 PPD (from 1-2 PPD) and is interested in smoking cessation options. She has tried the patch in the past with no luck and she does not chew gum and would prefer not to use the lozenges.     . Shortness of breath/dyspnea on exertion? No  . Orthopnea/PND? Yes (stable) - uses 1-2 pillows  . Edema? No . Lightheadedness/dizziness? Yes - throughout the day, worse than usual since starting losartan  . Daily weights at home? Yes - ~96-98 lb  . Blood pressure/heart rate monitoring at home? Yes - 96/56-126/75 mmHg . Following low-sodium/fluid-restricted diet? Yes  HF Medications: Carvedilol 3.125 mg PO BID Digoxin  0.0625 mg PO daily Losartan 12.5 mg PO daily Spironolactone 12.5 mg PO daily Furosemide 20 mg PO daily PRN weight gain/edema - not needed recently  KCl 40 mEq PO daily PRN with furosemide   Has the patient been experiencing any side effects to the medications prescribed?  Yes - excessive dizziness/hypotension  Does the patient have any problems obtaining medications due to transportation or finances?   No - BCBS commercial  Understanding of regimen: good Understanding of indications: good Potential of compliance: good Patient understands to avoid NSAIDs. Patient understands to avoid decongestants.    Pertinent Lab Values: . 09/19/16: Serum creatinine 0.59, BUN 7, Potassium 3.7, Sodium 137, BNP 413.5, Digoxin <0.2   Vital Signs: . Weight: 98 lb (dry weight: 98-99 lb) . Blood pressure: 112/76 mmHg  . Heart rate: 79 bpm    Assessment: 1. Chronicsystolic CHF (EF 27-06% on 5/18) likely 2/2 adriamycin but cannot exclude ischemic with h/o tobacco use.  - NYHA class III symptoms - Patient euvolemic on exam  - D/c losartan with worsening dizziness/hypotension - Continue digoxin 0.0625 mg daily, spironolactone 12.5 mg daily, carvedilol 3.125 mg BID, furosemide 20 mg daily as needed and potassium 10 mEq (4 tablets) when taking furosemide.    - Basic disease state pathophysiology, medication indication, mechanism and side effects reviewed at length with patient and she verbalized understanding.  2. Left breast cancer - Triple negative. S/p Adjuvant chemotherapy followed by left mastectomy and LN dissection 2/18 and XRT (completed 4/18) 3. Tobacco use - Patient reports still smoking 1/2 PPD -  Interested in smoking cessation options so will start Chantix today  Plan: 1) Medication changes: Based on clinical presentation, vital signs and recent labs will d/c losartan and start Chantix for smoking cessation 2) Labs: BMET, BNP, digoxin today 3) Follow-up: Dr. Haroldine Laws on 11/04/16   Ruta Hinds. Velva Harman, PharmD, BCPS, CPP Clinical Pharmacist Pager: [phone] Phone: [phone] 09/19/2016 3:06 PM

## 2016-09-19 NOTE — Patient Instructions (Addendum)
It was great to see you today!  Please STOP your losartan for the time being.   Please START Chantix 0.5 mg tablet by mouth once daily for 3 days, then increase to one 0.5 mg tablet twice daily for 4 days, then increase to one 1 mg tablet twice daily.  Labs today. We will call you with any abnormalities.   Please keep your appointment with Dr. Haroldine Laws on 11/04/16.

## 2016-09-28 DIAGNOSIS — C50912 Malignant neoplasm of unspecified site of left female breast: Secondary | ICD-10-CM | POA: Diagnosis not present

## 2016-09-28 DIAGNOSIS — R634 Abnormal weight loss: Secondary | ICD-10-CM | POA: Diagnosis not present

## 2016-09-28 DIAGNOSIS — R110 Nausea: Secondary | ICD-10-CM

## 2016-09-28 DIAGNOSIS — Z923 Personal history of irradiation: Secondary | ICD-10-CM | POA: Diagnosis not present

## 2016-09-28 DIAGNOSIS — I509 Heart failure, unspecified: Secondary | ICD-10-CM | POA: Diagnosis not present

## 2016-09-28 DIAGNOSIS — Z9221 Personal history of antineoplastic chemotherapy: Secondary | ICD-10-CM | POA: Diagnosis not present

## 2016-09-28 DIAGNOSIS — I890 Lymphedema, not elsewhere classified: Secondary | ICD-10-CM | POA: Diagnosis not present

## 2016-09-28 DIAGNOSIS — Z9012 Acquired absence of left breast and nipple: Secondary | ICD-10-CM | POA: Diagnosis not present

## 2016-10-06 ENCOUNTER — Telehealth (HOSPITAL_COMMUNITY): Payer: Self-pay

## 2016-10-06 NOTE — Telephone Encounter (Signed)
Patient called CHF clinic to report new onset n/v since starting Chantix last Monday. Advised per Oda Kilts PA-C to stop chantix and see if symptoms subside over the next couple of days. If not, advised to contact PCP to further eval. Patient aware and agreeable.  Renee Pain, RN

## 2016-11-04 ENCOUNTER — Ambulatory Visit (HOSPITAL_COMMUNITY)
Admission: RE | Admit: 2016-11-04 | Discharge: 2016-11-04 | Disposition: A | Discharge status: Home / Self Care | Payer: BLUE CROSS/BLUE SHIELD | Source: Ambulatory Visit | Attending: Internal Medicine | Admitting: Internal Medicine

## 2016-11-04 ENCOUNTER — Encounter (HOSPITAL_COMMUNITY): Payer: Self-pay | Admitting: Internal Medicine

## 2016-11-04 ENCOUNTER — Other Ambulatory Visit (HOSPITAL_COMMUNITY): Payer: Self-pay | Admitting: *Deleted

## 2016-11-04 ENCOUNTER — Encounter (HOSPITAL_COMMUNITY): Payer: Self-pay | Admitting: *Deleted

## 2016-11-04 VITALS — BP 101/65 | HR 85 | Wt 95.8 lb

## 2016-11-04 DIAGNOSIS — Z8249 Family history of ischemic heart disease and other diseases of the circulatory system: Secondary | ICD-10-CM | POA: Diagnosis not present

## 2016-11-04 DIAGNOSIS — I5022 Chronic systolic (congestive) heart failure: Secondary | ICD-10-CM | POA: Diagnosis not present

## 2016-11-04 DIAGNOSIS — Z9221 Personal history of antineoplastic chemotherapy: Secondary | ICD-10-CM | POA: Insufficient documentation

## 2016-11-04 DIAGNOSIS — F17210 Nicotine dependence, cigarettes, uncomplicated: Secondary | ICD-10-CM | POA: Diagnosis not present

## 2016-11-04 DIAGNOSIS — C50912 Malignant neoplasm of unspecified site of left female breast: Secondary | ICD-10-CM | POA: Diagnosis not present

## 2016-11-04 DIAGNOSIS — Z9012 Acquired absence of left breast and nipple: Secondary | ICD-10-CM | POA: Insufficient documentation

## 2016-11-04 DIAGNOSIS — I952 Hypotension due to drugs: Secondary | ICD-10-CM | POA: Diagnosis not present

## 2016-11-04 LAB — BASIC METABOLIC PANEL
Anion gap: 9 (ref 5–15)
BUN: 6 mg/dL (ref 6–20)
CALCIUM: 9.6 mg/dL (ref 8.9–10.3)
CHLORIDE: 101 mmol/L (ref 101–111)
CO2: 27 mmol/L (ref 22–32)
CREATININE: 0.62 mg/dL (ref 0.44–1.00)
Glucose, Bld: 114 mg/dL — ABNORMAL HIGH (ref 65–99)
Potassium: 4 mmol/L (ref 3.5–5.1)
SODIUM: 137 mmol/L (ref 135–145)

## 2016-11-04 LAB — CBC
HCT: 47.5 % — ABNORMAL HIGH (ref 36.0–46.0)
Hemoglobin: 16.2 g/dL — ABNORMAL HIGH (ref 12.0–15.0)
MCH: 32 pg (ref 26.0–34.0)
MCHC: 34.1 g/dL (ref 30.0–36.0)
MCV: 93.9 fL (ref 78.0–100.0)
Platelets: 221 10*3/uL (ref 150–400)
RBC: 5.06 MIL/uL (ref 3.87–5.11)
RDW: 13.8 % (ref 11.5–15.5)
WBC: 6.9 10*3/uL (ref 4.0–10.5)

## 2016-11-04 LAB — PROTIME-INR
INR: 1
PROTHROMBIN TIME: 13.2 s (ref 11.4–15.2)

## 2016-11-04 NOTE — Addendum Note (Signed)
Encounter addended by: Darron Doom, RN on: 11/04/2016 11:06 AM<BR>    Actions taken: Sign clinical note

## 2016-11-04 NOTE — Addendum Note (Signed)
Encounter addended by: Darron Doom, RN on: 11/04/2016 11:09 AM<BR>    Actions taken: Order list changed, Diagnosis association updated

## 2016-11-04 NOTE — Patient Instructions (Signed)
You have been scheduled for a Right & Left Heart Catheter, please see attached instructions.

## 2016-11-04 NOTE — Progress Notes (Signed)
[facility]  NOTE  Referring Physician: Gwenlyn Found  Primary Care: Dr. Garlon Hatchet  Primary Cardiologist: Gwenlyn Found   HPI:  Robin Rose is a 61 year old woman with h/o tobacco abuse, breast cancer (s/p chemo and left mastectomy) and recently diagnosed systolic HF who is referred by Dr. Gwenlyn Found for further evaluation of HF.   In 8/17 she was diagnosed with triple-negative breast cancer  with lymph node involvement. She had a clinically staged T4b, N1, M0 cancer which was stage IIIB. Her tumor was ER negative, PR negative and Her-2/neu negative. Her Ki67 was 30%. She underwent neoadjuvant chemotherapy including adriamycin (6 cycles)  followed by left mastectomy in 2/18 and XRT (completed 4/18).   Her EF 11/05/15 was 55% and by MUGA 04/06/16 was 69%. She was admitted to [facility] 08/02/16  with orthopnea and heart failure. She was diuresed 8 pounds. Her EF was 20-25% by echo on 08/03/16. RV was said to be normal. She saw Dr. Gwenlyn Found on 08/19/16. She was started on low-dose carvedilol. Lisinopril and lasix stopped by Dr. Garlon Hatchet on 08/15/16 due to low BP.   Here for f/u. Has been following in the HF PharmD. Unfortunately unable to tolerate even low-dose losartan due to hypotension. Continues to feel weak and easily fatigued. Can do ADLs but wipes her out and SOB with just washing dishes. Weight dropping now 95 pounds. No edema. + orthopnea. Not taking any lasix.  We suggested cath previously but she refused. Smoking 1/2 PPD. Failed Chantix.     PMHx:  . Allergic rhinitis 08/15/2016  . Chronic systolic heart failure (Robin Rose) 08/15/2016  . Malignant neoplasm of overlapping sites of left female breast (Robin Rose) 10/19/2015      Current Outpatient Prescriptions  Medication Sig Dispense Refill  . carvedilol (COREG) 3.125 MG tablet Take 1 tablet (3.125 mg total) by mouth 2 (two) times daily with a meal. 60 tablet 6  . digoxin (LANOXIN) 0.125 MG tablet Take 0.5 tablets (0.0625 mg total) by mouth daily. 45 tablet 3    . diphenhydrAMINE (BENADRYL) 25 mg capsule Take 25 mg by mouth every 6 (six) hours as needed for allergies.    . furosemide (LASIX) 20 MG tablet Take 20 mg by mouth daily as needed (swelling).     . potassium chloride (K-DUR,KLOR-CON) 10 MEQ tablet Take 40 mEq by mouth as needed. With furosemide    . Probiotic Product (PROBIOTIC ADVANCED PO) Take 1 capsule by mouth daily.     Marland Kitchen spironolactone (ALDACTONE) 25 MG tablet Take 0.5 tablets (12.5 mg total) by mouth daily. 45 tablet 3   No current facility-administered medications for this encounter.     Allergies  Allergen Reactions  . Codeine     Other reaction(s): GI Upset (intolerance) Unknown      Social History   Social History  . Marital status: Divorced    Spouse name: N/A  . Number of children: N/A  . Years of education: N/A   Occupational History  . Not on file.   Social History Main Topics  . Smoking status: Current Every Day Smoker  . Smokeless tobacco: Never Used  . Alcohol use Not on file  . Drug use: Unknown  . Sexual activity: Not on file   Other Topics Concern  . Not on file   Social History Narrative  . No narrative on file    Family History   . Heart disease Mother - + CAD with CABG at age 65 . Kidney cancer Mother  . Heart  disease Father  - CABG and AVR in 39s . Breast cancer Paternal Aunt  . Breast cancer Cousin  . Colon cancer Neg Hx    - 3 sisters - no CAD       Vitals:   11/04/16 1014  BP: 101/65  Pulse: 85  SpO2: 100%  Weight: 95 lb 12.8 oz (43.5 kg)    PHYSICAL EXAM: General:  Thin weak appearing.  No resp difficulty HEENT: normal Neck: supple. no JVD. Carotids 2+ bilat; no bruits. No lymphadenopathy or thryomegaly appreciated. Cor: PMI laterally displaced. Regular rate & rhythm. No rubs, gallops or murmurs. R chest port-cath Lungs: clear with decreased BS Abdomen: soft, nontender, nondistended. No hepatosplenomegaly. No bruits or masses. Good bowel sounds. Extremities: no  cyanosis, clubbing, rash, edema Neuro: alert & orientedx3, cranial nerves grossly intact. moves all 4 extremities w/o difficulty. Affect pleasant   Labs: 08/15/16: K 3.3 Cr 0.6   ASSESSMENT & PLAN: 1. Chronic systolic HF - Echo 7/58 EF 20-25%. Likely adriamycin induced but cannot exclude ischemic with h/o tobacco use and Q waves on ECG - Continues to struggle with NYHA IIIB symptoms. Med titration severely limited by hypotension - Volume status ok. Concern for low output.  - NYHA III. Volume status ok - Continue digoxin 0.125 and spiro 12.5 and carvedilol 3.125 bid - Can take lasix as needed for edema  - Plan R/L cath 2. 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 S/p Adjuvant chemotherapy followed by left mastectomy and LN dissection 2/18 and XRT. Now complete. 3. Tobacco use - Encouraged cessation. Failed Chantix.   [name], MD  10:44 AM

## 2016-11-07 ENCOUNTER — Encounter (HOSPITAL_COMMUNITY)
Admission: RE | Disposition: A | Discharge status: Home / Self Care | Payer: Self-pay | Source: Ambulatory Visit | Attending: Internal Medicine

## 2016-11-07 ENCOUNTER — Ambulatory Visit (HOSPITAL_COMMUNITY)
Admission: RE | Admit: 2016-11-07 | Discharge: 2016-11-07 | Disposition: A | Discharge status: Home / Self Care | Payer: BLUE CROSS/BLUE SHIELD | Source: Ambulatory Visit | Attending: Internal Medicine | Admitting: Internal Medicine

## 2016-11-07 DIAGNOSIS — C50912 Malignant neoplasm of unspecified site of left female breast: Secondary | ICD-10-CM | POA: Diagnosis not present

## 2016-11-07 DIAGNOSIS — Z803 Family history of malignant neoplasm of breast: Secondary | ICD-10-CM | POA: Insufficient documentation

## 2016-11-07 DIAGNOSIS — Z171 Estrogen receptor negative status [ER-]: Secondary | ICD-10-CM | POA: Diagnosis not present

## 2016-11-07 DIAGNOSIS — J309 Allergic rhinitis, unspecified: Secondary | ICD-10-CM | POA: Insufficient documentation

## 2016-11-07 DIAGNOSIS — Z79899 Other long term (current) drug therapy: Secondary | ICD-10-CM | POA: Diagnosis not present

## 2016-11-07 DIAGNOSIS — Z885 Allergy status to narcotic agent status: Secondary | ICD-10-CM | POA: Insufficient documentation

## 2016-11-07 DIAGNOSIS — Z8051 Family history of malignant neoplasm of kidney: Secondary | ICD-10-CM | POA: Diagnosis not present

## 2016-11-07 DIAGNOSIS — F17200 Nicotine dependence, unspecified, uncomplicated: Secondary | ICD-10-CM | POA: Diagnosis not present

## 2016-11-07 DIAGNOSIS — I5022 Chronic systolic (congestive) heart failure: Secondary | ICD-10-CM | POA: Diagnosis not present

## 2016-11-07 DIAGNOSIS — Z9221 Personal history of antineoplastic chemotherapy: Secondary | ICD-10-CM | POA: Insufficient documentation

## 2016-11-07 DIAGNOSIS — I2510 Atherosclerotic heart disease of native coronary artery without angina pectoris: Secondary | ICD-10-CM | POA: Diagnosis present

## 2016-11-07 DIAGNOSIS — C773 Secondary and unspecified malignant neoplasm of axilla and upper limb lymph nodes: Secondary | ICD-10-CM | POA: Diagnosis not present

## 2016-11-07 DIAGNOSIS — Z9012 Acquired absence of left breast and nipple: Secondary | ICD-10-CM | POA: Diagnosis not present

## 2016-11-07 DIAGNOSIS — Z8249 Family history of ischemic heart disease and other diseases of the circulatory system: Secondary | ICD-10-CM | POA: Insufficient documentation

## 2016-11-07 HISTORY — PX: RIGHT/LEFT HEART CATH AND CORONARY ANGIOGRAPHY: CATH118266

## 2016-11-07 LAB — POCT ACTIVATED CLOTTING TIME: Activated Clotting Time: 136 seconds

## 2016-11-07 LAB — POCT I-STAT 3, VENOUS BLOOD GAS (G3P V)
ACID-BASE EXCESS: 2 mmol/L (ref 0.0–2.0)
ACID-BASE EXCESS: 3 mmol/L — AB (ref 0.0–2.0)
BICARBONATE: 28 mmol/L (ref 20.0–28.0)
Bicarbonate: 28.2 mmol/L — ABNORMAL HIGH (ref 20.0–28.0)
O2 SAT: 60 %
O2 Saturation: 58 %
PCO2 VEN: 45.4 mmHg (ref 44.0–60.0)
PO2 VEN: 31 mmHg — AB (ref 32.0–45.0)
TCO2: 29 mmol/L (ref 22–32)
TCO2: 30 mmol/L (ref 22–32)
pCO2, Ven: 45.6 mmHg (ref 44.0–60.0)
pH, Ven: 7.396 (ref 7.250–7.430)
pH, Ven: 7.402 (ref 7.250–7.430)
pO2, Ven: 31 mmHg — CL (ref 32.0–45.0)

## 2016-11-07 LAB — POCT I-STAT 3, ART BLOOD GAS (G3+)
Acid-Base Excess: 1 mmol/L (ref 0.0–2.0)
BICARBONATE: 25.7 mmol/L (ref 20.0–28.0)
O2 SAT: 92 %
PCO2 ART: 38.7 mmHg (ref 32.0–48.0)
PH ART: 7.43 (ref 7.350–7.450)
PO2 ART: 63 mmHg — AB (ref 83.0–108.0)
TCO2: 27 mmol/L (ref 22–32)

## 2016-11-07 SURGERY — RIGHT/LEFT HEART CATH AND CORONARY ANGIOGRAPHY
Anesthesia: LOCAL

## 2016-11-07 MED ORDER — SODIUM CHLORIDE 0.9 % IV SOLN
INTRAVENOUS | Status: DC
  Administered 2016-11-07: 09:00:00 via INTRAVENOUS

## 2016-11-07 MED ORDER — LIDOCAINE HCL (PF) 1 % IJ SOLN
INTRAMUSCULAR | Status: AC
  Filled 2016-11-07: qty 30

## 2016-11-07 MED ORDER — HEPARIN SODIUM (PORCINE) 1000 UNIT/ML IJ SOLN
INTRAMUSCULAR | Status: AC
  Filled 2016-11-07: qty 1

## 2016-11-07 MED ORDER — SODIUM CHLORIDE 0.9% FLUSH: 3.0000 mL | Freq: Two times a day (BID) | INTRAVENOUS | Status: DC

## 2016-11-07 MED ORDER — SODIUM CHLORIDE 0.9 % IV SOLN: 250.0000 mL | INTRAVENOUS | Status: DC | PRN

## 2016-11-07 MED ORDER — HEPARIN (PORCINE) IN NACL 2-0.9 UNIT/ML-% IJ SOLN
INTRAMUSCULAR | Status: AC | PRN
  Administered 2016-11-07: 1000 mL

## 2016-11-07 MED ORDER — SODIUM CHLORIDE 0.9 % IV SOLN: INTRAVENOUS | Status: AC

## 2016-11-07 MED ORDER — ONDANSETRON HCL 4 MG/2ML IJ SOLN: 4.0000 mg | Freq: Four times a day (QID) | INTRAMUSCULAR | Status: DC | PRN

## 2016-11-07 MED ORDER — HEPARIN SODIUM (PORCINE) 1000 UNIT/ML IJ SOLN
INTRAMUSCULAR | Status: DC | PRN
  Administered 2016-11-07: 1000 [IU] via INTRAVENOUS

## 2016-11-07 MED ORDER — ASPIRIN 81 MG PO CHEW
CHEWABLE_TABLET | ORAL | Status: AC
  Filled 2016-11-07: qty 1

## 2016-11-07 MED ORDER — ASPIRIN 81 MG PO CHEW
81.0000 mg | CHEWABLE_TABLET | ORAL | Status: AC
  Administered 2016-11-07: 81 mg via ORAL

## 2016-11-07 MED ORDER — SODIUM CHLORIDE 0.9% FLUSH: 3.0000 mL | INTRAVENOUS | Status: DC | PRN

## 2016-11-07 MED ORDER — VERAPAMIL HCL 2.5 MG/ML IV SOLN
INTRAVENOUS | Status: DC | PRN
  Administered 2016-11-07: 12:00:00 via INTRA_ARTERIAL

## 2016-11-07 MED ORDER — LIDOCAINE HCL (PF) 1 % IJ SOLN
INTRAMUSCULAR | Status: DC | PRN
  Administered 2016-11-07: 2 mL
  Administered 2016-11-07: 15 mL

## 2016-11-07 MED ORDER — VERAPAMIL HCL 2.5 MG/ML IV SOLN
INTRAVENOUS | Status: AC
  Filled 2016-11-07: qty 2

## 2016-11-07 MED ORDER — IOPAMIDOL (ISOVUE-370) INJECTION 76%
INTRAVENOUS | Status: AC
  Filled 2016-11-07: qty 100

## 2016-11-07 MED ORDER — FENTANYL CITRATE (PF) 100 MCG/2ML IJ SOLN
INTRAMUSCULAR | Status: AC
  Filled 2016-11-07: qty 2

## 2016-11-07 MED ORDER — IOPAMIDOL (ISOVUE-370) INJECTION 76%
INTRAVENOUS | Status: DC | PRN
  Administered 2016-11-07: 75 mL via INTRA_ARTERIAL

## 2016-11-07 MED ORDER — ACETAMINOPHEN 325 MG PO TABS: 650.0000 mg | ORAL_TABLET | ORAL | Status: DC | PRN

## 2016-11-07 MED ORDER — HEPARIN (PORCINE) IN NACL 2-0.9 UNIT/ML-% IJ SOLN
INTRAMUSCULAR | Status: AC
  Filled 2016-11-07: qty 1000

## 2016-11-07 MED ORDER — MIDAZOLAM HCL 2 MG/2ML IJ SOLN
INTRAMUSCULAR | Status: AC
  Filled 2016-11-07: qty 2

## 2016-11-07 MED ORDER — MIDAZOLAM HCL 2 MG/2ML IJ SOLN
INTRAMUSCULAR | Status: DC | PRN
  Administered 2016-11-07: 1 mg via INTRAVENOUS

## 2016-11-07 MED ORDER — FENTANYL CITRATE (PF) 100 MCG/2ML IJ SOLN
INTRAMUSCULAR | Status: DC | PRN
  Administered 2016-11-07: 25 ug via INTRAVENOUS

## 2016-11-07 SURGICAL SUPPLY — 14 items
CATH INFINITI 5FR MULTPACK ANG (CATHETERS) ×1 IMPLANT
CATH SWAN GANZ 7F STRAIGHT (CATHETERS) ×1 IMPLANT
DEVICE RAD TR BAND REGULAR (VASCULAR PRODUCTS) ×1 IMPLANT
GLIDESHEATH SLEND SS 6F .021 (SHEATH) ×1 IMPLANT
KIT HEART LEFT (KITS) ×2 IMPLANT
PACK CARDIAC CATHETERIZATION (CUSTOM PROCEDURE TRAY) ×2 IMPLANT
SHEATH PINNACLE 5F 10CM (SHEATH) ×1 IMPLANT
SHEATH PINNACLE 7F 10CM (SHEATH) ×2 IMPLANT
SYR MEDRAD MARK V 150ML (SYRINGE) ×2 IMPLANT
TRANSDUCER W/STOPCOCK (MISCELLANEOUS) ×2 IMPLANT
TUBING CIL FLEX 10 FLL-RA (TUBING) ×2 IMPLANT
WIRE EMERALD 3MM-J .025X260CM (WIRE) ×1 IMPLANT
WIRE EMERALD 3MM-J .035X150CM (WIRE) ×1 IMPLANT
WIRE HI TORQ VERSACORE-J 145CM (WIRE) ×1 IMPLANT

## 2016-11-07 NOTE — Progress Notes (Signed)
Site area: rt fem art 84fr Rt fem vein 73fr Site Prior to Removal:  Level 0 Pressure Applied For:57min Manual:   yes Patient Status During Pull:  A/O Post Pull Site:  Level 0 Post Pull Instructions Given: yes, pt has been informed and understands   Post Pull Pulses Present: 2+ rt pt Dressing Applied:  tegaderm and a 4x4 Bedrest begins  @ 13:30:00 Comments:Pty leaves cath lab procedure room in stable condition. Rt groin unremarkable. Dressing is CDI.

## 2016-11-07 NOTE — Interval H&P Note (Signed)
History and Physical Interval Note:  11/07/2016 11:23 AM  Robin Rose  has presented today for surgery, with the diagnosis of chf  The various methods of treatment have been discussed with the patient and family. After consideration of risks, benefits and other options for treatment, the patient has consented to  Procedure(s): RIGHT/LEFT HEART CATH AND CORONARY ANGIOGRAPHY (N/A) and possible coronary angioplasty as a surgical intervention .  The patient's history has been reviewed, patient examined, no change in status, stable for surgery.  I have reviewed the patient's chart and labs.  Questions were answered to the patient's satisfaction.     Maimouna Rondeau, Quillian Quince

## 2016-11-07 NOTE — H&P (View-Only) (Signed)
 [facility]  NOTE  Referring Physician: Berry  Primary Care: Dr. Dough  Primary Cardiologist: Berry   HPI:  Robin Rose is a 61-year-old woman with h/o tobacco abuse, breast cancer (s/p chemo and left mastectomy) and recently diagnosed systolic HF who is referred by Dr. Berry for further evaluation of HF.   In 8/17 she was diagnosed with triple-negative breast cancer  with lymph node involvement. She had a clinically staged T4b, N1, M0 cancer which was stage IIIB. Her tumor was ER negative, PR negative and Her-2/neu negative. Her Ki67 was 30%. She underwent neoadjuvant chemotherapy including adriamycin (6 cycles)  followed by left mastectomy in 2/18 and XRT (completed 4/18).   Her EF 11/05/15 was 55% and by MUGA 04/06/16 was 69%. She was admitted to [facility] 08/02/16  with orthopnea and heart failure. She was diuresed 8 pounds. Her EF was 20-25% by echo on 08/03/16. RV was said to be normal. She saw Dr. Berry on 08/19/16. She was started on low-dose carvedilol. Lisinopril and lasix stopped by Dr. Dough on 08/15/16 due to low BP.   Here for f/u. Has been following in the HF PharmD. Unfortunately unable to tolerate even low-dose losartan due to hypotension. Continues to feel weak and easily fatigued. Can do ADLs but wipes her out and SOB with just washing dishes. Weight dropping now 95 pounds. No edema. + orthopnea. Not taking any lasix.  We suggested cath previously but she refused. Smoking 1/2 PPD. Failed Chantix.     PMHx:  . Allergic rhinitis 08/15/2016  . Chronic systolic heart failure ([facility]) 08/15/2016  . Malignant neoplasm of overlapping sites of left female breast ([facility]) 10/19/2015      Current Outpatient Prescriptions  Medication Sig Dispense Refill  . carvedilol (COREG) 3.125 MG tablet Take 1 tablet (3.125 mg total) by mouth 2 (two) times daily with a meal. 60 tablet 6  . digoxin (LANOXIN) 0.125 MG tablet Take 0.5 tablets (0.0625 mg total) by mouth daily. 45 tablet 3    . diphenhydrAMINE (BENADRYL) 25 mg capsule Take 25 mg by mouth every 6 (six) hours as needed for allergies.    . furosemide (LASIX) 20 MG tablet Take 20 mg by mouth daily as needed (swelling).     . potassium chloride (K-DUR,KLOR-CON) 10 MEQ tablet Take 40 mEq by mouth as needed. With furosemide    . Probiotic Product (PROBIOTIC ADVANCED PO) Take 1 capsule by mouth daily.     . spironolactone (ALDACTONE) 25 MG tablet Take 0.5 tablets (12.5 mg total) by mouth daily. 45 tablet 3   No current facility-administered medications for this encounter.     Allergies  Allergen Reactions  . Codeine     Other reaction(s): GI Upset (intolerance) Unknown      Social History   Social History  . Marital status: Divorced    Spouse name: N/A  . Number of children: N/A  . Years of education: N/A   Occupational History  . Not on file.   Social History Main Topics  . Smoking status: Current Every Day Smoker  . Smokeless tobacco: Never Used  . Alcohol use Not on file  . Drug use: Unknown  . Sexual activity: Not on file   Other Topics Concern  . Not on file   Social History Narrative  . No narrative on file    Family History   . Heart disease Mother - + CAD with CABG at age 84 . Kidney cancer Mother  . Heart   disease Father  - CABG and AVR in 70s . Breast cancer Paternal Aunt  . Breast cancer Cousin  . Colon cancer Neg Hx    - 3 sisters - no CAD       Vitals:   11/04/16 1014  BP: 101/65  Pulse: 85  SpO2: 100%  Weight: 95 lb 12.8 oz (43.5 kg)    PHYSICAL EXAM: General:  Thin weak appearing.  No resp difficulty HEENT: normal Neck: supple. no JVD. Carotids 2+ bilat; no bruits. No lymphadenopathy or thryomegaly appreciated. Cor: PMI laterally displaced. Regular rate & rhythm. No rubs, gallops or murmurs. R chest port-cath Lungs: clear with decreased BS Abdomen: soft, nontender, nondistended. No hepatosplenomegaly. No bruits or masses. Good bowel sounds. Extremities: no  cyanosis, clubbing, rash, edema Neuro: alert & orientedx3, cranial nerves grossly intact. moves all 4 extremities w/o difficulty. Affect pleasant   Labs: 08/15/16: K 3.3 Cr 0.6   ASSESSMENT & PLAN: 1. Chronic systolic HF - Echo 5/18 EF 20-25%. Likely adriamycin induced but cannot exclude ischemic with h/o tobacco use and Q waves on ECG - Continues to struggle with NYHA IIIB symptoms. Med titration severely limited by hypotension - Volume status ok. Concern for low output.  - NYHA III. Volume status ok - Continue digoxin 0.125 and spiro 12.5 and carvedilol 3.125 bid - Can take lasix as needed for edema  - Plan R/L cath 2. 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 S/p Adjuvant chemotherapy followed by left mastectomy and LN dissection 2/18 and XRT. Now complete. 3. Tobacco use - Encouraged cessation. Failed Chantix.   Bensimhon, Daniel, MD  10:44 AM   

## 2016-11-07 NOTE — Discharge Instructions (Signed)
Radial Site Care Refer to this sheet in the next few weeks. These instructions provide you with information about caring for yourself after your procedure. Your health care provider may also give you more specific instructions. Your treatment has been planned according to current medical practices, but problems sometimes occur. Call your health care provider if you have any problems or questions after your procedure. What can I expect after the procedure? After your procedure, it is typical to have the following:  Bruising at the radial site that usually fades within 1-2 weeks.  Blood collecting in the tissue (hematoma) that may be painful to the touch. It should usually decrease in size and tenderness within 1-2 weeks.  Follow these instructions at home:  Take medicines only as directed by your health care provider.  You may shower 24-48 hours after the procedure or as directed by your health care provider. Remove the bandage (dressing) and gently wash the site with plain soap and water. Pat the area dry with a clean towel. Do not rub the site, because this may cause bleeding.  Do not take baths, swim, or use a hot tub until your health care provider approves.  Check your insertion site every day for redness, swelling, or drainage.  Do not apply powder or lotion to the site.  Do not flex or bend the affected arm for 24 hours or as directed by your health care provider.  Do not push or pull heavy objects with the affected arm for 24 hours or as directed by your health care provider.  Do not lift over 10 lb (4.5 kg) for 5 days after your procedure or as directed by your health care provider.  Ask your health care provider when it is okay to: ? Return to work or school. ? Resume usual physical activities or sports. ? Resume sexual activity.  Do not drive home if you are discharged the same day as the procedure. Have someone else drive you.  You may drive 24 hours after the procedure  unless otherwise instructed by your health care provider.  Do not operate machinery or power tools for 24 hours after the procedure.  If your procedure was done as an outpatient procedure, which means that you went home the same day as your procedure, a responsible adult should be with you for the first 24 hours after you arrive home.  Keep all follow-up visits as directed by your health care provider. This is important. Contact a health care provider if:  You have a fever.  You have chills.  You have increased bleeding from the radial site. Hold pressure on the site. CALL 911 Get help right away if:  You have unusual pain at the radial site.  You have redness, warmth, or swelling at the radial site.  You have drainage (other than a small amount of blood on the dressing) from the radial site.  The radial site is bleeding, and the bleeding does not stop after 30 minutes of holding steady pressure on the site.  Your arm or hand becomes pale, cool, tingly, or numb. This information is not intended to replace advice given to you by your health care provider. Make sure you discuss any questions you have with your health care provider. Document Released: 04/02/2010 Document Revised: 08/06/2015 Document Reviewed: 09/16/2013 Elsevier Interactive Patient Education  2018 [employer] This sheet gives you information about how to care for yourself after your procedure. Your health care provider may also give  you more specific instructions. If you have problems or questions, contact your health care provider. What can I expect after the procedure? After the procedure, it is common to have bruising and tenderness at the catheter insertion area. Follow these instructions at home: Insertion site care  Follow instructions from your health care provider about how to take care of your insertion site. Make sure you: ? Wash your hands with soap and water before you change your  bandage (dressing). If soap and water are not available, use hand sanitizer. ? Change your dressing as told by your health care provider. ? Leave stitches (sutures), skin glue, or adhesive strips in place. These skin closures may need to stay in place for 2 weeks or longer. If adhesive strip edges start to loosen and curl up, you may trim the loose edges. Do not remove adhesive strips completely unless your health care provider tells you to do that.  Do not take baths, swim, or use a hot tub until your health care provider approves.  You may shower 24-48 hours after the procedure or as told by your health care provider. ? Gently wash the site with plain soap and water. ? Pat the area dry with a clean towel. ? Do not rub the site. This may cause bleeding.  Do not apply powder or lotion to the site. Keep the site clean and dry.  Check your insertion site every day for signs of infection. Check for: ? Redness, swelling, or pain. ? Fluid or blood. ? Warmth. ? Pus or a bad smell. Activity  Rest as told by your health care provider, usually for 1-2 days.  Do not lift anything that is heavier than 10 lbs. (4.5 kg) or as told by your health care provider.  Do not drive for 24 hours if you were given a medicine to help you relax (sedative).  Do not drive or use heavy machinery while taking prescription pain medicine. General instructions  Return to your normal activities as told by your health care provider, usually in about a week. Ask your health care provider what activities are safe for you.  If the catheter site starts bleeding, lie flat and put pressure on the site. If the bleeding does not stop, get help right away. This is a medical emergency.  Drink enough fluid to keep your urine clear or pale yellow. This helps flush the contrast dye from your body.  Take over-the-counter and prescription medicines only as told by your health care provider.  Keep all follow-up visits as told by  your health care provider. This is important. Contact a health care provider if:  You have a fever or chills.  You have redness, swelling, or pain around your insertion site.  You have fluid or blood coming from your insertion site.  The insertion site feels warm to the touch.  You have pus or a bad smell coming from your insertion site.  You have bruising around the insertion site.  You notice blood collecting in the tissue around the catheter site (hematoma). The hematoma may be painful to the touch. Get help right away if:  You have severe pain at the catheter insertion area.  The catheter insertion area swells very fast.  The catheter insertion area is bleeding, and the bleeding does not stop when you hold steady pressure on the area.  The area near or just beyond the catheter insertion site becomes pale, cool, tingly, or numb. These symptoms may represent a serious problem  that is an emergency. Do not wait to see if the symptoms will go away. Get medical help right away. Call your local emergency services (911 in the [place] Do not drive yourself to the hospital. Summary  After the procedure, it is common to have bruising and tenderness at the catheter insertion area.  After the procedure, it is important to rest and drink plenty of fluids.  Do not take baths, swim, or use a hot tub until your health care provider says it is okay to do so. You may shower 24-48 hours after the procedure or as told by your health care provider.  If the catheter site starts bleeding, lie flat and put pressure on the site. If the bleeding does not stop, get help right away. This is a medical emergency. This information is not intended to replace advice given to you by your health care provider. Make sure you discuss any questions you have with your health care provider. Document Released: 09/16/2004 Document Revised: 02/03/2016 Document Reviewed: 02/03/2016 Elsevier Interactive Patient Education   2017 [employer].

## 2016-11-08 ENCOUNTER — Encounter (HOSPITAL_COMMUNITY): Payer: Self-pay | Admitting: Internal Medicine

## 2016-11-18 ENCOUNTER — Ambulatory Visit (HOSPITAL_COMMUNITY): Payer: BLUE CROSS/BLUE SHIELD | Attending: Cardiology

## 2016-11-18 ENCOUNTER — Other Ambulatory Visit: Payer: Self-pay

## 2016-11-18 DIAGNOSIS — Z9012 Acquired absence of left breast and nipple: Secondary | ICD-10-CM | POA: Diagnosis not present

## 2016-11-18 DIAGNOSIS — Z853 Personal history of malignant neoplasm of breast: Secondary | ICD-10-CM | POA: Insufficient documentation

## 2016-11-18 DIAGNOSIS — I428 Other cardiomyopathies: Secondary | ICD-10-CM | POA: Diagnosis present

## 2016-11-18 DIAGNOSIS — R0609 Other forms of dyspnea: Secondary | ICD-10-CM | POA: Diagnosis present

## 2016-11-18 DIAGNOSIS — Z9221 Personal history of antineoplastic chemotherapy: Secondary | ICD-10-CM | POA: Insufficient documentation

## 2016-11-18 DIAGNOSIS — Z720 Tobacco use: Secondary | ICD-10-CM | POA: Insufficient documentation

## 2016-11-22 ENCOUNTER — Ambulatory Visit (INDEPENDENT_AMBULATORY_CARE_PROVIDER_SITE_OTHER): Payer: BLUE CROSS/BLUE SHIELD | Admitting: Cardiovascular Disease

## 2016-11-22 ENCOUNTER — Encounter: Payer: Self-pay | Admitting: Cardiovascular Disease

## 2016-11-22 DIAGNOSIS — I255 Ischemic cardiomyopathy: Secondary | ICD-10-CM | POA: Diagnosis not present

## 2016-11-22 DIAGNOSIS — Z720 Tobacco use: Secondary | ICD-10-CM

## 2016-11-22 DIAGNOSIS — I739 Peripheral vascular disease, unspecified: Secondary | ICD-10-CM | POA: Diagnosis not present

## 2016-11-22 MED ORDER — BUPROPION HCL 100 MG PO TABS: ORAL_TABLET | ORAL | 1 refills | Status: DC

## 2016-11-22 NOTE — Assessment & Plan Note (Signed)
Ongoing tobacco abuse recalcitrant risk factor modification. 

## 2016-11-22 NOTE — Patient Instructions (Addendum)
Medication Instructions: Your physician recommends that you continue on your current medications as directed. Please refer to the Current Medication list given to you today.  Start Wellbutrin (Bupropion) 100 mg--Take 150 mg (1 &1/2 tablets) daily for 3 days. Then Increase to 1& 1/2 tablets (150 mg) twice daily.   Follow-Up: Your physician recommends that you schedule a follow-up appointment in: 3 months with Dr. Gwenlyn Found.  If you need a refill on your cardiac medications before your next appointment, please call your pharmacy.

## 2016-11-22 NOTE — Assessment & Plan Note (Signed)
This Is has an EF of 20-25% range by recent echo performed on 11/18/16 unchanged from prior echo. She is on low-dose carvedilol. She could not tolerate lisinopril because of hypotension. Initial impression was that her reduced EF was related to chemotherapy from her breast CA. Right left heart catheter showed severe three-vessel disease with fairly low filling pressures. I suspect that she would benefit from coronary artery bypass grafting. I will discuss with Dr. Jeffie Pollock.

## 2016-11-22 NOTE — Progress Notes (Signed)
11/22/2016 Ward Givens   02-08-1956  [id number]  Primary Physician Garlon Hatchet, Jaymes Graff, MD Primary Cardiologist: Lorretta Harp MD Lupe Carney, [place]  HPI:  Robin Rose is a 61 y.o. female thin-appearing divorced Caucasian female mother of 2, mother of 2 grandchildren who was referred for diagnosis and treatment of probable nonischemic cardiomyopathy. I last saw her in the office 08/19/16. She has no cardiac risk factors although her parents to have coronary artery disease. She was diagnosed with triple-negative breast cancer a year ago and had left mastectomy. Her EF 11/05/15 the city of 55% and by [facility] 04/06/16 was 69%. She is admitted to the hospital 2 weeks ago with orthopnea and heart failure. She was diuresed 8 pounds. Her EF was 20-25% on 08/03/16. She feels clinically improved today. She does weigh herself on a daily basis. I referred her to Dr. Haroldine Laws  who performed a right and left heart cath on her 11/07/16. She had severe three-vessel disease with low filling pressures. In addition, she did have an incidentally noted occluded right common iliac artery although she is asymptomatic from this.    Current Meds  Medication Sig  . acetaminophen (TYLENOL) 500 MG tablet Take 1,000 mg by mouth 2 (two) times daily as needed for moderate pain or headache.  . carvedilol (COREG) 3.125 MG tablet Take 1 tablet (3.125 mg total) by mouth 2 (two) times daily with a meal.  . digoxin (LANOXIN) 0.125 MG tablet Take 0.5 tablets (0.0625 mg total) by mouth daily.  . diphenhydrAMINE (BENADRYL) 25 mg capsule Take 25 mg by mouth every 6 (six) hours as needed for allergies.  . furosemide (LASIX) 20 MG tablet Take 20 mg by mouth 4 (four) times daily as needed (swelling).   . ondansetron (ZOFRAN) 4 MG tablet Take 4 mg by mouth every 4 (four) hours as needed for nausea/vomiting.  Marland Kitchen oxymetazoline (AFRIN) 0.05 % nasal spray Place 1 spray into both nostrils at bedtime as needed for congestion.  .  potassium chloride (K-DUR,KLOR-CON) 10 MEQ tablet Take 40 mEq by mouth daily as needed (swelling). With first dose of furosemide  . Probiotic Product (PROBIOTIC ADVANCED PO) Take 1 capsule by mouth daily.   Marland Kitchen spironolactone (ALDACTONE) 25 MG tablet Take 0.5 tablets (12.5 mg total) by mouth daily. (Patient taking differently: Take 12.5 mg by mouth at bedtime. )     Allergies  Allergen Reactions  . Chantix [Varenicline] Nausea And Vomiting  . Codeine     GI Upset, headaches      Social History   Social History  . Marital status: Divorced    Spouse name: N/A  . Number of children: N/A  . Years of education: N/A   Occupational History  . Not on file.   Social History Main Topics  . Smoking status: Current Every Day Smoker  . Smokeless tobacco: Never Used  . Alcohol use Not on file  . Drug use: Unknown  . Sexual activity: Not on file   Other Topics Concern  . Not on file   Social History Narrative  . No narrative on file     Review of Systems: General: negative for chills, fever, night sweats or weight changes.  Cardiovascular: negative for chest pain, dyspnea on exertion, edema, orthopnea, palpitations, paroxysmal nocturnal dyspnea or shortness of breath Dermatological: negative for rash Respiratory: negative for cough or wheezing Urologic: negative for hematuria Abdominal: negative for nausea, vomiting, diarrhea, bright red blood per rectum, melena, or hematemesis  Neurologic: negative for visual changes, syncope, or dizziness All other systems reviewed and are otherwise negative except as noted above.    Blood pressure 108/68, pulse 84, height 5\' 3"  (1.6 m), weight 95 lb (43.1 kg).  General appearance: alert and no distress Neck: no adenopathy, no carotid bruit, no JVD, supple, symmetrical, trachea midline and thyroid not enlarged, symmetric, no tenderness/mass/nodules Lungs: clear to auscultation bilaterally Heart: regular rate and rhythm, S1, S2 normal, no murmur,  click, rub or gallop Extremities: extremities normal, atraumatic, no cyanosis or edema  EKG Not  performed today  ASSESSMENT AND PLAN:   Tobacco abuse Ongoing tobacco abuse recalcitrant risk factor modification  Peripheral arterial disease (HCC) Occluded right common iliac artery reconstituting in the external iliac although asymptomatic. This could be percutaneously addressable if clinically indicated.  Ischemic cardiomyopathy This Is has an EF of 20-25% range by recent echo performed on 11/18/16 unchanged from prior echo. She is on low-dose carvedilol. She could not tolerate lisinopril because of hypotension. Initial impression was that her reduced EF was related to chemotherapy from her breast CA. Right left heart catheter showed severe three-vessel disease with fairly low filling pressures. I suspect that she would benefit from coronary artery bypass grafting. I will discuss with Dr. Jeffie Pollock.       Lorretta Harp MD FACP,FACC,FAHA, FSCAI 11/22/2016 12:00 PM

## 2016-11-22 NOTE — Assessment & Plan Note (Signed)
Occluded right common iliac artery reconstituting in the external iliac although asymptomatic. This could be percutaneously addressable if clinically indicated.

## 2016-11-22 NOTE — Addendum Note (Signed)
Addended by: Therisa Doyne on: 11/22/2016 12:11 PM   Modules accepted: Orders

## 2016-12-01 ENCOUNTER — Telehealth: Payer: Self-pay | Admitting: Cardiovascular Disease

## 2016-12-01 NOTE — Telephone Encounter (Signed)
Left message for patient, dr berry is in the cath lab today. I have left a message asking for him to call me to discuss when he is free.

## 2016-12-01 NOTE — Telephone Encounter (Signed)
New message    Pt is calling about her last appt. She said she is calling to find out what is going on. She said Dr. Gwenlyn Found was going to talk to Dr. Haroldine Laws about what to do and she has not heard anything.

## 2016-12-01 NOTE — Telephone Encounter (Signed)
Spoke with pt, according to dr berry and bensimhon, they are waiting to hear back from dr Lucianne Lei tright. Someone will be getting in touch with her.

## 2016-12-03 ENCOUNTER — Other Ambulatory Visit: Payer: Self-pay | Admitting: Cardiovascular Disease

## 2016-12-05 NOTE — Telephone Encounter (Signed)
Rx has been sent to the pharmacy electronically. ° °

## 2016-12-06 ENCOUNTER — Encounter: Payer: BLUE CROSS/BLUE SHIELD | Admitting: Cardiothoracic Surgery

## 2016-12-07 ENCOUNTER — Encounter: Payer: Self-pay | Admitting: Cardiothoracic Surgery

## 2016-12-07 ENCOUNTER — Institutional Professional Consult (permissible substitution) (INDEPENDENT_AMBULATORY_CARE_PROVIDER_SITE_OTHER): Payer: BLUE CROSS/BLUE SHIELD | Admitting: Cardiothoracic Surgery

## 2016-12-07 VITALS — BP 106/79 | HR 101 | Resp 16 | Ht 63.0 in | Wt 93.0 lb

## 2016-12-07 DIAGNOSIS — I2510 Atherosclerotic heart disease of native coronary artery without angina pectoris: Secondary | ICD-10-CM | POA: Diagnosis not present

## 2016-12-07 NOTE — Progress Notes (Signed)
PCP is Dough, Jaymes Graff, MD Referring Provider is Bensimhon, Shaune Pascal, MD  Chief Complaint  Patient presents with  . Coronary Artery Disease    eval for CABG...CATH 11/07/16, ECHO 11/18/16   Patient examined, coronary angiograms and 2-D echocardiogram and CT scan of chest and right heart cath hemodynamic data all personally reviewed and discussed with patient.  HPI: 61 year old female treated for advanced carcinoma of the left breast within the past year-first with chemotherapy then left modified radical mastectomy and then radiation to the left chest. She was given Adriamycin with subsequent drop in her ejection fraction from 60% to 50% down to 25%. With this she has had associated weight loss from 112-93 pounds, and decline in functional status and inability to perform the activities of daily living without significant effort. She underwent coronary angiogram showing three-vessel coronary disease and ejection fraction of 25%. LVEDP was 15. Her PA pressures were normal and cardiac index by Fick 1.8. On echo her LV diameter was 5 cm with global hypokinesia.  The patient has been treated at the advanced heart failure clinic with heart failure meds with minimal symptomatic improvement. The patient describes shortness of breath and fatigue with activity but denies chest pain.  The patient underwent general anesthesia and left mastectomy without cardiac complications earlier this year. This was performed prior to radiation therapy. There were positive axillary nodes involved.  The patient has a positive family history of coronary disease-both parents of had CABG.  The patient was found to have a right iliac artery occlusion at the time of catheterization and had right radial artery cannulation.   Her coronary vessels are small in the 1 mm range, poor targets for grafting. The patient has radiation skin changes in her left chest and her left chest also has a severe pectus deformity. CT scan shows COPD  from smoking history. Overall she appears very frail and and poor general condition and would be a Poor candidate for CABG.  No past medical history on file.  Past Surgical History:  Procedure Laterality Date  . RIGHT/LEFT HEART CATH AND CORONARY ANGIOGRAPHY N/A 11/07/2016   Procedure: RIGHT/LEFT HEART CATH AND CORONARY ANGIOGRAPHY;  Surgeon: Jolaine Artist, MD;  Location: [facility];  Service: Cardiovascular;  Laterality: N/A;    No family history on file.  Social History Social History  Substance Use Topics  . Smoking status: Former Smoker    Packs/day: 1.00    Years: 40.00    Types: Cigarettes    Quit date: 11/23/2016  . Smokeless tobacco: Never Used     Comment: HAS STOPPED SMOKING X 2 WKS!!!!!  . Alcohol use Not on file    Current Outpatient Prescriptions  Medication Sig Dispense Refill  . acetaminophen (TYLENOL) 500 MG tablet Take 1,000 mg by mouth 2 (two) times daily as needed for moderate pain or headache.    Marland Kitchen buPROPion (WELLBUTRIN) 100 MG tablet Take 1 & 1/2 tablets by mouth once daily for 3 days. Then increase to 1 & 1/2 tablets twice daily. 180 tablet 1  . carvedilol (COREG) 3.125 MG tablet TAKE 1 TABLET (3.125 MG TOTAL) BY MOUTH 2 (TWO) TIMES DAILY WITH A MEAL. 90 tablet 2  . digoxin (LANOXIN) 0.125 MG tablet Take 0.5 tablets (0.0625 mg total) by mouth daily. 45 tablet 3  . diphenhydrAMINE (BENADRYL) 25 mg capsule Take 25 mg by mouth every 6 (six) hours as needed for allergies.    . furosemide (LASIX) 20 MG tablet Take 20 mg  by mouth 4 (four) times daily as needed (swelling).     . ondansetron (ZOFRAN) 4 MG tablet Take 4 mg by mouth every 4 (four) hours as needed for nausea/vomiting.  2  . oxymetazoline (AFRIN) 0.05 % nasal spray Place 1 spray into both nostrils at bedtime as needed for congestion.    . potassium chloride (K-DUR,KLOR-CON) 10 MEQ tablet Take 40 mEq by mouth daily as needed (swelling). With first dose of furosemide    . Probiotic Product  (PROBIOTIC ADVANCED PO) Take 1 capsule by mouth daily.     Marland Kitchen spironolactone (ALDACTONE) 25 MG tablet Take 0.5 tablets (12.5 mg total) by mouth daily. (Patient taking differently: Take 12.5 mg by mouth at bedtime. ) 45 tablet 3   No current facility-administered medications for this visit.     Allergies  Allergen Reactions  . Chantix [Varenicline] Nausea And Vomiting  . Codeine     GI Upset, headaches      Review of Systems  Weight loss Poor exercise tolerance Minimal edema Poor appetite No orthopnea or PND No sick be or falls No abdominal pain or bleeding  BP 106/79 (BP Location: Right Arm, Patient Position: Sitting, Cuff Size: Normal)   Pulse (!) 101   Resp 16   Ht 5\' 3"  (1.6 m)   Wt 93 lb (42.2 kg)   SpO2 98% Comment: ON RA  BMI 16.47 kg/m  Physical Exam     Physical Exam  General: Middle-aged female very frail and chronically ill-appearing HEENT: Normocephalic pupils equal , dentition adequate Neck: Supple without JVD, adenopathy, or bruit Chest: Clear to auscultation, symmetrical breath sounds, no rhonchi, no tenderness. Severe radiation skin changes over left chest with severe pectus deformity as well.             Cardiovascular: Regular rate and rhythm, tachycardic, no murmur, no gallop, peripheral pulses are not             palpable in  lower extremities Abdomen:  Soft, nontender, no palpable mass or organomegaly Extremities: Warm, well-perfused, no clubbing cyanosis edema or tenderness,              no venous stasis changes of the legs but small saphenous vein size Rectal/GU: Deferred Neuro: Grossly non--focal and symmetrical throughout Skin: Clean and dry without rash or ulceration      Impression: The patient is a poor candidate for CABG with poor targets, poor LV function, poor general health and deconditioning and with significant chest wall problems that would prevent adequate healing including radiation dermatitis and severe pectus  deformity  Plan: Continue medical therapy for her CAD Len Childs, MD [facility] [phone]

## 2016-12-08 NOTE — Progress Notes (Signed)
December is fine unless she has bypass surgery in which case I will see her 4-6 weeks post bypass

## 2016-12-13 ENCOUNTER — Telehealth: Payer: Self-pay | Admitting: Cardiovascular Disease

## 2016-12-13 ENCOUNTER — Encounter (HOSPITAL_COMMUNITY): Payer: Self-pay | Admitting: *Deleted

## 2016-12-13 NOTE — Telephone Encounter (Signed)
ECHOCARDIOGRAM COMPLETE  Order: [id number]  Status:  Final result Visible to patient:  Yes (MyChart) Dx:  DOE (dyspnea on exertion); Nonischemi...  Notes recorded by Therisa Doyne on 12/07/2016 at 9:17 AM EDT Pt has appt with Dr. Prescott Gum today. She is currently scheduled to see you in December. Would you like to see her sooner? ------  Notes recorded by Lorretta Harp, MD on 11/20/2016 at 4:25 PM EDT EF unchanged since June. Needs ROV to discuss

## 2016-12-13 NOTE — Telephone Encounter (Deleted)
-----   Message from Lorretta Harp, MD sent at 11/20/2016  4:25 PM EDT ----- EF unchanged since June. Needs ROV to discuss

## 2016-12-13 NOTE — Progress Notes (Signed)
Received medical records request from [facility] on 12/06/18-18 CASE # [id number]  Requested records faxed today to [phone]  Original request will be scanned to patient's electronic medical record.

## 2016-12-15 NOTE — Telephone Encounter (Signed)
Dec is fine unless she is symptomatic

## 2016-12-19 DIAGNOSIS — I509 Heart failure, unspecified: Secondary | ICD-10-CM | POA: Diagnosis not present

## 2016-12-19 DIAGNOSIS — Z9012 Acquired absence of left breast and nipple: Secondary | ICD-10-CM | POA: Diagnosis not present

## 2016-12-19 DIAGNOSIS — Z171 Estrogen receptor negative status [ER-]: Secondary | ICD-10-CM | POA: Diagnosis not present

## 2016-12-19 DIAGNOSIS — Z9221 Personal history of antineoplastic chemotherapy: Secondary | ICD-10-CM | POA: Diagnosis not present

## 2016-12-19 DIAGNOSIS — I890 Lymphedema, not elsewhere classified: Secondary | ICD-10-CM | POA: Diagnosis not present

## 2016-12-19 DIAGNOSIS — Z923 Personal history of irradiation: Secondary | ICD-10-CM | POA: Diagnosis not present

## 2016-12-19 DIAGNOSIS — C50912 Malignant neoplasm of unspecified site of left female breast: Secondary | ICD-10-CM | POA: Diagnosis not present

## 2016-12-19 DIAGNOSIS — R918 Other nonspecific abnormal finding of lung field: Secondary | ICD-10-CM | POA: Diagnosis not present

## 2016-12-19 DIAGNOSIS — R634 Abnormal weight loss: Secondary | ICD-10-CM | POA: Diagnosis not present

## 2016-12-30 ENCOUNTER — Encounter: Payer: Self-pay | Admitting: Pulmonary Disease

## 2016-12-30 ENCOUNTER — Ambulatory Visit (INDEPENDENT_AMBULATORY_CARE_PROVIDER_SITE_OTHER): Payer: BLUE CROSS/BLUE SHIELD | Admitting: Pulmonary Disease

## 2016-12-30 DIAGNOSIS — R918 Other nonspecific abnormal finding of lung field: Secondary | ICD-10-CM | POA: Diagnosis not present

## 2016-12-30 NOTE — Patient Instructions (Signed)
I have reviewed his CT scan.  There are very small lung nodules that are likely benign. I do not see any clear evidence of MAI infection. We will continue to monitor this with a CT scan next year We will schedule pulmonary function test for better evaluation of your emphysema Follow-up in clinic in 3 months.

## 2016-12-30 NOTE — Progress Notes (Signed)
Robin Rose    [id number]    02/14/1956  Primary Care Physician:Dough, Jaymes Graff, MD  Referring Physician: [name], MD [address] [place], [place] [address]  Chief complaint: Consult for evaluation of MAC infection, abnormal CT scan  HPI: 61 year old with history of cardiomyopathy, breast cancer She was treated for left breast cancer with chemotherapy and radiation [last radiation dose was April 2018].  She underwent mastectomy in February 2018.  She is also followed for ischemic cardiomyopathy with EF 20-25%.  She is not a candidate for surgery and is being managed medically.  She has had CT scans of the chest which shows scattered subcentimeter pulmonary nodules and has been referred for further evaluation, possible MAI infection  Chief complaint is dyspnea with activity.  She denies any cough, sputum production, fevers, chills, hemoptysis.  Occupation: Retired Optometrist Exposures: No known exposures Smoking history: 30-pack-year smoking history.  Quit 1 month ago Travel History: Not significant  Outpatient Encounter Prescriptions as of 12/30/2016  Medication Sig  . acetaminophen (TYLENOL) 500 MG tablet Take 1,000 mg by mouth 2 (two) times daily as needed for moderate pain or headache.  Marland Kitchen buPROPion (WELLBUTRIN) 100 MG tablet Take 1 & 1/2 tablets by mouth once daily for 3 days. Then increase to 1 & 1/2 tablets twice daily.  . carvedilol (COREG) 3.125 MG tablet TAKE 1 TABLET (3.125 MG TOTAL) BY MOUTH 2 (TWO) TIMES DAILY WITH A MEAL.  Marland Kitchen digoxin (LANOXIN) 0.125 MG tablet Take 0.5 tablets (0.0625 mg total) by mouth daily.  . diphenhydrAMINE (BENADRYL) 25 mg capsule Take 25 mg by mouth every 6 (six) hours as needed for allergies.  . furosemide (LASIX) 20 MG tablet Take 20 mg by mouth 4 (four) times daily as needed (swelling).   . ondansetron (ZOFRAN) 4 MG tablet Take 4 mg by mouth every 4 (four) hours as needed for nausea/vomiting.  Marland Kitchen oxymetazoline (AFRIN) 0.05 %  nasal spray Place 1 spray into both nostrils at bedtime as needed for congestion.  . potassium chloride (K-DUR,KLOR-CON) 10 MEQ tablet Take 40 mEq by mouth daily as needed (swelling). With first dose of furosemide  . Probiotic Product (PROBIOTIC ADVANCED PO) Take 1 capsule by mouth daily.   Marland Kitchen spironolactone (ALDACTONE) 25 MG tablet Take 0.5 tablets (12.5 mg total) by mouth daily. (Patient taking differently: Take 12.5 mg by mouth at bedtime. )   No facility-administered encounter medications on file as of 12/30/2016.     Allergies as of 12/30/2016 - Review Complete 12/30/2016  Allergen Reaction Noted  . Chantix [varenicline] Nausea And Vomiting 11/04/2016  . Codeine  09/29/2015    No past medical history on file.  Past Surgical History:  Procedure Laterality Date  . RIGHT/LEFT HEART CATH AND CORONARY ANGIOGRAPHY N/A 11/07/2016   Procedure: RIGHT/LEFT HEART CATH AND CORONARY ANGIOGRAPHY;  Surgeon: Jolaine Artist, MD;  Location: [facility];  Service: Cardiovascular;  Laterality: N/A;    No family history on file.  Social History   Social History  . Marital status: Divorced    Spouse name: N/A  . Number of children: N/A  . Years of education: N/A   Occupational History  . Not on file.   Social History Main Topics  . Smoking status: Former Smoker    Packs/day: 1.00    Years: 40.00    Types: Cigarettes    Quit date: 11/23/2016  . Smokeless tobacco: Never Used     Comment: HAS  STOPPED SMOKING X 2 WKS!!!!!  . Alcohol use Not on file  . Drug use: Unknown  . Sexual activity: Not on file   Other Topics Concern  . Not on file   Social History Narrative  . No narrative on file   Review of systems: Review of Systems  Constitutional: Negative for fever and chills.  HENT: Negative.   Eyes: Negative for blurred vision.  Respiratory: as per HPI  Cardiovascular: Negative for chest pain and palpitations.  Gastrointestinal: Negative for vomiting, diarrhea, blood per  rectum. Genitourinary: Negative for dysuria, urgency, frequency and hematuria.  Musculoskeletal: Negative for myalgias, back pain and joint pain.  Skin: Negative for itching and rash.  Neurological: Negative for dizziness, tremors, focal weakness, seizures and loss of consciousness.  Endo/Heme/Allergies: Negative for environmental allergies.  Psychiatric/Behavioral: Negative for depression, suicidal ideas and hallucinations.  All other systems reviewed and are negative.  Physical Exam: Blood pressure 118/70, pulse 81, height 5' 3"  (1.6 m), weight 92 lb 6 oz (41.9 kg), SpO2 98 %. Gen:      No acute distress HEENT:  EOMI, sclera anicteric Neck:     No masses; no thyromegaly Lungs:    Clear to auscultation bilaterally; normal respiratory effort CV:         Regular rate and rhythm; no murmurs Abd:      + bowel sounds; soft, non-tender; no palpable masses, no distension Ext:    No edema; adequate peripheral perfusion Skin:      Warm and dry; no rash Neuro: alert and oriented x 3 Psych: normal mood and affect  Data Reviewed: CT chest 05/20/16-small effusions, apical scarring, emphysematous changes CT chest 08/01/16- no pulmonary embolism, small bilateral effusions, groundglass opacities consistent with CHF, minimal left upper lobe consolidation CT chest 12/21/16-scattered pulmonary nodules less than 5 mm, chronic upper lobe apical scarring with bronchial wall thickening, emphysematous changes I have reviewed all images personally  Assessment:  Consult for evaluation of abnormal CT scan, ? MAI infection I have reviewed the CT scan which shows very small scattered nodules with groundglass appearance suggestive of non specific inflammation.  There is no clear evidence of MAI infection or other chronic infection she is not very symptomatic and she does not have any cough or sputum production for cultures.  We will continue to monitor this with a repeat CT scan next year in 6 months.  Reassured the  patient about her CT scan findings  Emphysema CT shows emphysematous changes likely secondary to smoking We will get PFTs for evaluation and determine if she has COPD.   She does not need inhalers at present.  Plan/Recommendations: - Follow-up CT next year - PFTs  Marshell Garfinkel MD [facility] Pager [phone] 12/30/2016, 10:28 AM  CC: [name], MD

## 2017-01-06 ENCOUNTER — Encounter (HOSPITAL_COMMUNITY): Payer: Self-pay | Admitting: Internal Medicine

## 2017-01-06 ENCOUNTER — Ambulatory Visit (HOSPITAL_COMMUNITY)
Admission: RE | Admit: 2017-01-06 | Discharge: 2017-01-06 | Disposition: A | Discharge status: Home / Self Care | Payer: BLUE CROSS/BLUE SHIELD | Source: Ambulatory Visit | Attending: Internal Medicine | Admitting: Internal Medicine

## 2017-01-06 VITALS — BP 104/68 | HR 98 | Wt 93.0 lb

## 2017-01-06 DIAGNOSIS — Z9012 Acquired absence of left breast and nipple: Secondary | ICD-10-CM | POA: Insufficient documentation

## 2017-01-06 DIAGNOSIS — R918 Other nonspecific abnormal finding of lung field: Secondary | ICD-10-CM | POA: Diagnosis not present

## 2017-01-06 DIAGNOSIS — Z8249 Family history of ischemic heart disease and other diseases of the circulatory system: Secondary | ICD-10-CM | POA: Insufficient documentation

## 2017-01-06 DIAGNOSIS — Z888 Allergy status to other drugs, medicaments and biological substances status: Secondary | ICD-10-CM | POA: Diagnosis not present

## 2017-01-06 DIAGNOSIS — Z803 Family history of malignant neoplasm of breast: Secondary | ICD-10-CM | POA: Diagnosis not present

## 2017-01-06 DIAGNOSIS — Z171 Estrogen receptor negative status [ER-]: Secondary | ICD-10-CM | POA: Insufficient documentation

## 2017-01-06 DIAGNOSIS — Z923 Personal history of irradiation: Secondary | ICD-10-CM | POA: Diagnosis not present

## 2017-01-06 DIAGNOSIS — R42 Dizziness and giddiness: Secondary | ICD-10-CM | POA: Diagnosis not present

## 2017-01-06 DIAGNOSIS — Z87891 Personal history of nicotine dependence: Secondary | ICD-10-CM | POA: Insufficient documentation

## 2017-01-06 DIAGNOSIS — C50812 Malignant neoplasm of overlapping sites of left female breast: Secondary | ICD-10-CM | POA: Diagnosis not present

## 2017-01-06 DIAGNOSIS — Z79899 Other long term (current) drug therapy: Secondary | ICD-10-CM | POA: Insufficient documentation

## 2017-01-06 DIAGNOSIS — Z885 Allergy status to narcotic agent status: Secondary | ICD-10-CM | POA: Insufficient documentation

## 2017-01-06 DIAGNOSIS — Z9221 Personal history of antineoplastic chemotherapy: Secondary | ICD-10-CM | POA: Insufficient documentation

## 2017-01-06 DIAGNOSIS — Z8051 Family history of malignant neoplasm of kidney: Secondary | ICD-10-CM | POA: Diagnosis not present

## 2017-01-06 DIAGNOSIS — I5022 Chronic systolic (congestive) heart failure: Secondary | ICD-10-CM | POA: Diagnosis not present

## 2017-01-06 DIAGNOSIS — I2510 Atherosclerotic heart disease of native coronary artery without angina pectoris: Secondary | ICD-10-CM

## 2017-01-06 NOTE — Progress Notes (Addendum)
ADVANCED HF CLINIC  NOTE  Referring Physician: Gwenlyn Found  Primary Care: Dr. Garlon Hatchet  Primary Cardiologist: Gwenlyn Found   HPI:  [name] is a 61 year old woman with h/o tobacco abuse, breast cancer (s/p chemo and left mastectomy) and recently diagnosed systolic HF.    In 8/17 she was diagnosed with triple-negative breast cancer  with lymph node involvement. She had a clinically staged T4b, N1, M0 cancer which was stage IIIB. Her tumor was ER negative, PR negative and Her-2/neu negative. Her Ki67 was 30%. She underwent neoadjuvant chemotherapy including adriamycin (6 cycles)  followed by left mastectomy in 2/18 and XRT (completed 4/18).   Her EF 11/05/15 was 55% and by MUGA 04/06/16 was 69%. She was admitted to [facility] 08/02/16  with orthopnea and heart failure. She was diuresed 8 pounds. Her EF was 20-25% by echo on 08/03/16. RV was said to be normal. She saw Dr. Gwenlyn Found on 08/19/16. She was started on low-dose carvedilol. Lisinopril and lasix stopped by Dr. Garlon Hatchet on 08/15/16 due to low BP.   She returns for HF follow up with her sister. Complaining of dizziness. SOB with steps. SOB with exertion. Not walking much. Appetite. No fever or chills. Weight at home 90-92 pounds. Taking medications. Stopped smoking 6 weeks a day.   ECHO 11/2016 EF 20-25% grade I DD.   RHC/LHC 11/07/2016   Prox RCA lesion, 90 %stenosed.  Post Atrio lesion, 100 %stenosed.  Mid RCA lesion, 30 %stenosed.  Ost Cx to Prox Cx lesion, 80 %stenosed.  1st Mrg lesion, 100 %stenosed.  Dist Cx lesion, 90 %stenosed.  Dist LAD lesion, 80 %stenosed.  Mid LAD-2 lesion, 70 %stenosed.  Mid LAD-1 lesion, 40 %stenosed.  Ost 2nd Diag to 2nd Diag lesion, 99 %stenosed.  Ao = 135/72 (97) LV = 135/15 RA = 1 RV = 28/3 PA = 29/8 (17) PCW = 5 Fick cardiac output/index = 2.6/1.8 Thermo CO/CI = 7.0/5.0 PVR = 4.8 WU FA sat = 92% PA sat = 58%, 60%   PMHx:  . Allergic rhinitis 08/15/2016  . Chronic systolic heart failure  ([place]) 08/15/2016  . Malignant neoplasm of overlapping sites of left female breast ([place]) 10/19/2015      Current Outpatient Prescriptions  Medication Sig Dispense Refill  . acetaminophen (TYLENOL) 500 MG tablet Take 1,000 mg by mouth 2 (two) times daily as needed for moderate pain or headache.    Marland Kitchen buPROPion (WELLBUTRIN) 100 MG tablet Take 1 & 1/2 tablets by mouth once daily for 3 days. Then increase to 1 & 1/2 tablets twice daily. 180 tablet 1  . carvedilol (COREG) 3.125 MG tablet TAKE 1 TABLET (3.125 MG TOTAL) BY MOUTH 2 (TWO) TIMES DAILY WITH A MEAL. 90 tablet 2  . digoxin (LANOXIN) 0.125 MG tablet Take 0.5 tablets (0.0625 mg total) by mouth daily. 45 tablet 3  . diphenhydrAMINE (BENADRYL) 25 mg capsule Take 25 mg by mouth every 6 (six) hours as needed for allergies.    Marland Kitchen MISC NATURAL PRODUCTS PO Take 1 tablet by mouth daily.    Marland Kitchen oxymetazoline (AFRIN) 0.05 % nasal spray Place 1 spray into both nostrils at bedtime as needed for congestion.    . Probiotic Product (PROBIOTIC ADVANCED PO) Take 1 capsule by mouth daily.     Marland Kitchen spironolactone (ALDACTONE) 25 MG tablet Take 0.5 tablets (12.5 mg total) by mouth daily. (Patient taking differently: Take 12.5 mg by mouth at bedtime. ) 45 tablet 3  . furosemide (LASIX) 20 MG tablet Take 20 mg  by mouth as needed.    . ondansetron (ZOFRAN) 4 MG tablet Take 4 mg by mouth every 4 (four) hours as needed for nausea/vomiting.  2  . potassium chloride (K-DUR,KLOR-CON) 10 MEQ tablet Take 40 mEq by mouth daily as needed (swelling). With first dose of furosemide     No current facility-administered medications for this encounter.     Allergies  Allergen Reactions  . Chantix [Varenicline] Nausea And Vomiting  . Codeine     GI Upset, headaches        Social History   Social History  . Marital status: Divorced    Spouse name: N/A  . Number of children: N/A  . Years of education: N/A   Occupational History  . Not on file.   Social History Main  Topics  . Smoking status: Former Smoker    Packs/day: 1.00    Years: 40.00    Types: Cigarettes    Quit date: 11/23/2016  . Smokeless tobacco: Never Used     Comment: HAS STOPPED SMOKING X 2 WKS!!!!!  . Alcohol use Not on file  . Drug use: Unknown  . Sexual activity: Not on file   Other Topics Concern  . Not on file   Social History Narrative  . No narrative on file    Family History   . Heart disease Mother - + CAD with CABG at age 90 . Kidney cancer Mother  . Heart disease Father  - CABG and AVR in 25s . Breast cancer Paternal Aunt  . Breast cancer Cousin  . Colon cancer Neg Hx    - 3 sisters - no CAD       Vitals:   01/06/17 1012  BP: 104/68  Pulse: 98  SpO2: 98%  Weight: 93 lb (42.2 kg)    PHYSICAL EXAM: General:  Appears fatigued. No resp difficulty HEENT: normal Neck: supple. no JVD. Carotids 2+ bilat; no bruits. No lymphadenopathy or thryomegaly appreciated. Cor: PMI nondisplaced. Regular rate & rhythm. No rubs, or murmurs. + S3 . R upper chest porta cath.  Lungs: clear Abdomen: soft, nontender, nondistended. No hepatosplenomegaly. No bruits or masses. Good bowel sounds. Extremities: no cyanosis, clubbing, rash, edema Neuro: alert & orientedx3, cranial nerves grossly intact. moves all 4 extremities w/o difficulty. Affect pleasant  ASSESSMENT & PLAN: 1. Chronic systolic HF 11/4501 .  ECHO EF 20-25%.  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 Had [facility] 10/2016 . Poor candidate for CABG. Not transplant candidate.  NYHA IIIb. Volume status low. Stop spiro with ongoing dizziness.  - Continue digoxin 0.125  - Continue carvedilol 3.125 bid -Set up CPX.  - Would like to get CMRI however she has porta cath.   2. 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 S/p Adjuvant chemotherapy followed by left mastectomy and LN dissection 2/18 and XRT. Now complete. - She will contact surgeon in [place] to remove porta cath. Ok to proceed from a cardiac standpoint.    3. Tobacco use Quit smoking 6 weeks ago 4. Pulmonary Nodules- saw Dr Vaughan Browner. CTwith scattered nodules. Thought to be non specific inflammation. Plans for Ct in a year.   I am very concerned about overall prognosis because we continue to stop heart failure medications. Not a candidate for heart transplant. Discussed possible LVAD. Needs CMRI but has a porta cath so that is not an option. She will contact surgeon in [place] to remove porta cath. She may not be able to complete due to claustrophobia. Set up for  CPX bike versus treadmill.   Follow up in 4 weeks.    Darrick Grinder, NP  10:35 AM  Patient seen and examined with Darrick Grinder, NP. We discussed all aspects of the encounter. I agree with the assessment and plan as stated above.   Echo and cath films reviewed in clinic personally. She is struggling with NYHA IIIB symptoms in setting of severe iCM +/- adriamycin. EF 25-30%. Also with significant COPD. Has been turned down for CABG due to poor targets.Cannot tolerate much HF meds. She is not candidate for transplant due to breast CA and PAD. I doubt she will be VAD candidate either but will proceed with w/u. Schedule CPX. Have asked her to get port removed so we can consider cMRI to look for viability. D/w Dr. Gwenlyn Found who will review cath films to see if PCI of LAD is possible.   Total time spent 45 minutes. Over half that time spent discussing above.   [name], MD  11:21 AM

## 2017-01-06 NOTE — Patient Instructions (Signed)
Stop Spironolactone   Your physician has recommended that you have a cardiopulmonary stress test (CPX). CPX testing is a non-invasive measurement of heart and lung function. It replaces a traditional treadmill stress test. This type of test provides a tremendous amount of information that relates not only to your present condition but also for future outcomes. This test combines measurements of you ventilation, respiratory gas exchange in the lungs, electrocardiogram (EKG), blood pressure and physical response before, during, and following an exercise protocol.  Your physician recommends that you schedule a follow-up appointment in: 4 weeks

## 2017-01-11 NOTE — Addendum Note (Signed)
Encounter addended by: Jolaine Artist, MD on: 01/11/2017 11:46 AM<BR>    Actions taken: Sign clinical note

## 2017-01-18 ENCOUNTER — Ambulatory Visit (HOSPITAL_COMMUNITY): Payer: BLUE CROSS/BLUE SHIELD | Attending: Internal Medicine

## 2017-01-18 DIAGNOSIS — I5022 Chronic systolic (congestive) heart failure: Secondary | ICD-10-CM | POA: Insufficient documentation

## 2017-01-18 DIAGNOSIS — J449 Chronic obstructive pulmonary disease, unspecified: Secondary | ICD-10-CM | POA: Diagnosis not present

## 2017-01-20 DIAGNOSIS — I5022 Chronic systolic (congestive) heart failure: Secondary | ICD-10-CM | POA: Diagnosis not present

## 2017-01-25 DIAGNOSIS — Z736 Limitation of activities due to disability: Secondary | ICD-10-CM

## 2017-01-30 ENCOUNTER — Encounter (HOSPITAL_COMMUNITY): Payer: Self-pay | Admitting: *Deleted

## 2017-01-30 NOTE — Progress Notes (Signed)
Received record request from [employer] on 01/20/2017.  All requested records faxed today to [phone].  Original request will be scanned to patient's electronic medical record.

## 2017-02-07 ENCOUNTER — Encounter (HOSPITAL_COMMUNITY): Payer: Self-pay | Admitting: *Deleted

## 2017-02-07 ENCOUNTER — Ambulatory Visit (HOSPITAL_COMMUNITY)
Admission: RE | Admit: 2017-02-07 | Discharge: 2017-02-07 | Disposition: A | Discharge status: Home / Self Care | Payer: BLUE CROSS/BLUE SHIELD | Source: Ambulatory Visit | Attending: Internal Medicine | Admitting: Internal Medicine

## 2017-02-07 ENCOUNTER — Other Ambulatory Visit (HOSPITAL_COMMUNITY): Payer: Self-pay | Admitting: *Deleted

## 2017-02-07 ENCOUNTER — Encounter (HOSPITAL_COMMUNITY): Payer: Self-pay | Admitting: Internal Medicine

## 2017-02-07 VITALS — BP 118/72 | HR 92 | Wt 95.4 lb

## 2017-02-07 DIAGNOSIS — Z803 Family history of malignant neoplasm of breast: Secondary | ICD-10-CM | POA: Diagnosis not present

## 2017-02-07 DIAGNOSIS — I2510 Atherosclerotic heart disease of native coronary artery without angina pectoris: Secondary | ICD-10-CM | POA: Diagnosis not present

## 2017-02-07 DIAGNOSIS — Z8249 Family history of ischemic heart disease and other diseases of the circulatory system: Secondary | ICD-10-CM | POA: Insufficient documentation

## 2017-02-07 DIAGNOSIS — Z888 Allergy status to other drugs, medicaments and biological substances status: Secondary | ICD-10-CM | POA: Insufficient documentation

## 2017-02-07 DIAGNOSIS — Z79899 Other long term (current) drug therapy: Secondary | ICD-10-CM | POA: Diagnosis not present

## 2017-02-07 DIAGNOSIS — Z87891 Personal history of nicotine dependence: Secondary | ICD-10-CM | POA: Insufficient documentation

## 2017-02-07 DIAGNOSIS — Z171 Estrogen receptor negative status [ER-]: Secondary | ICD-10-CM | POA: Insufficient documentation

## 2017-02-07 DIAGNOSIS — Z885 Allergy status to narcotic agent status: Secondary | ICD-10-CM | POA: Insufficient documentation

## 2017-02-07 DIAGNOSIS — Z8051 Family history of malignant neoplasm of kidney: Secondary | ICD-10-CM | POA: Diagnosis not present

## 2017-02-07 DIAGNOSIS — Z9221 Personal history of antineoplastic chemotherapy: Secondary | ICD-10-CM | POA: Diagnosis not present

## 2017-02-07 DIAGNOSIS — C50912 Malignant neoplasm of unspecified site of left female breast: Secondary | ICD-10-CM | POA: Insufficient documentation

## 2017-02-07 DIAGNOSIS — R918 Other nonspecific abnormal finding of lung field: Secondary | ICD-10-CM | POA: Insufficient documentation

## 2017-02-07 DIAGNOSIS — Z9012 Acquired absence of left breast and nipple: Secondary | ICD-10-CM | POA: Diagnosis not present

## 2017-02-07 DIAGNOSIS — I5022 Chronic systolic (congestive) heart failure: Secondary | ICD-10-CM | POA: Diagnosis not present

## 2017-02-07 DIAGNOSIS — Z720 Tobacco use: Secondary | ICD-10-CM

## 2017-02-07 MED ORDER — LOSARTAN POTASSIUM 25 MG PO TABS: 12.5000 mg | ORAL_TABLET | Freq: Every day | ORAL | 3 refills | Status: DC

## 2017-02-07 NOTE — Patient Instructions (Signed)
START taking Losartan 12.5 mg (0.5 Tablet) Once Daily at bedtime.  You have been scheduled for a Right Heart Catheterization on Monday December 3rd, please see attached instruction sheet.  Follow up in 2 Months.

## 2017-02-07 NOTE — Progress Notes (Signed)
ADVANCED HF CLINIC  NOTE  Referring Physician: Gwenlyn Found  Primary Care: Dr. Garlon Hatchet  Primary Cardiologist: Gwenlyn Found   HPI:  [name] is a 61 year old woman with h/o tobacco abuse, breast cancer (s/p chemo and left mastectomy) and recently diagnosed systolic HF.    In 8/17 she was diagnosed with triple-negative breast cancer  with lymph node involvement. She had a clinically staged T4b, N1, M0 cancer which was stage IIIB. Her tumor was ER negative, PR negative and Her-2/neu negative. Her Ki67 was 30%. She underwent neoadjuvant chemotherapy including adriamycin (6 cycles)  followed by left mastectomy in 2/18 and XRT (completed 4/18).   Her EF 11/05/15 was 55% and by MUGA 04/06/16 was 69%. She was admitted to [facility] 08/02/16  with orthopnea and heart failure. She was diuresed 8 pounds. Her EF was 20-25% by echo on 08/03/16. RV was said to be normal. She saw Dr. Gwenlyn Found on 08/19/16. She was started on low-dose carvedilol. Lisinopril and lasix stopped by Dr. Garlon Hatchet on 08/15/16 due to low BP.   She returns for HF follow up with her sister. Continues to feel very weak. NYHA IIIB symptoms. Can do ADLs but very fatigued and winded with much else. No edema, orthopnea, PND. BP on low side but no dizziness. No longer smoking. Denies claudication.   CPX 11/18  FVC 2.01 (66%)    FEV1 1.53 (64%)     FEV1/FVC 76 (96%)     MVV 63 (70%)   Resting HR: 88 Peak HR: 126  (79% age predicted max HR)  BP rest: 124/76 BP peak: 158/70 Peak VO2: 16.8 (67% predicted peak VO2) VE/VCO2 slope: 48 OUES: 0.71 Peak RER: 1.07 Ventilatory Threshold: 12.7 (50% predicted and 76% measured peak VO2) VE/MVV: 51% O2pulse: 7  (100% predicted O2pulse)   ECHO 11/2016 EF 20-25% grade I DD.   RHC/LHC 11/07/2016   Prox RCA lesion, 90 %stenosed.  Post Atrio lesion, 100 %stenosed.  Mid RCA lesion, 30 %stenosed.  Ost Cx to Prox Cx lesion, 80 %stenosed.  1st Mrg lesion, 100 %stenosed.  Dist Cx lesion, 90  %stenosed.  Dist LAD lesion, 80 %stenosed.  Mid LAD-2 lesion, 70 %stenosed.  Mid LAD-1 lesion, 40 %stenosed.  Ost 2nd Diag to 2nd Diag lesion, 99 %stenosed.  Ao = 135/72 (97) LV = 135/15 RA = 1 RV = 28/3 PA = 29/8 (17) PCW = 5 Fick cardiac output/index = 2.6/1.8 Thermo CO/CI = 7.0/5.0 PVR = 4.8 WU FA sat = 92% PA sat = 58%, 60%   PMHx:  . Allergic rhinitis 08/15/2016  . Chronic systolic heart failure ([place]) 08/15/2016  . Malignant neoplasm of overlapping sites of left female breast ([place]) 10/19/2015      Current Outpatient Medications  Medication Sig Dispense Refill  . acetaminophen (TYLENOL) 500 MG tablet Take 1,000 mg by mouth 2 (two) times daily as needed for moderate pain or headache.    Marland Kitchen buPROPion (WELLBUTRIN) 100 MG tablet Take 1 & 1/2 tablets by mouth once daily for 3 days. Then increase to 1 & 1/2 tablets twice daily. 180 tablet 1  . carvedilol (COREG) 3.125 MG tablet TAKE 1 TABLET (3.125 MG TOTAL) BY MOUTH 2 (TWO) TIMES DAILY WITH A MEAL. 90 tablet 2  . digoxin (LANOXIN) 0.125 MG tablet Take 0.5 tablets (0.0625 mg total) by mouth daily. 45 tablet 3  . diphenhydrAMINE (BENADRYL) 25 mg capsule Take 25 mg by mouth every 6 (six) hours as needed for allergies.    . furosemide (LASIX) 20  MG tablet Take 20 mg by mouth as needed.    . MISC NATURAL PRODUCTS PO Take 1 tablet by mouth daily.    . ondansetron (ZOFRAN) 4 MG tablet Take 4 mg by mouth every 4 (four) hours as needed for nausea/vomiting.  2  . oxymetazoline (AFRIN) 0.05 % nasal spray Place 1 spray into both nostrils at bedtime as needed for congestion.    . potassium chloride (K-DUR,KLOR-CON) 10 MEQ tablet Take 40 mEq by mouth daily as needed (swelling). With first dose of furosemide    . Probiotic Product (PROBIOTIC ADVANCED PO) Take 1 capsule by mouth daily.      No current facility-administered medications for this encounter.     Allergies  Allergen Reactions  . Chantix [Varenicline] Nausea And Vomiting    . Codeine     GI Upset, headaches        Social History   Socioeconomic History  . Marital status: Divorced    Spouse name: Not on file  . Number of children: Not on file  . Years of education: Not on file  . Highest education level: Not on file  Social Needs  . Financial resource strain: Not on file  . Food insecurity - worry: Not on file  . Food insecurity - inability: Not on file  . Transportation needs - medical: Not on file  . Transportation needs - non-medical: Not on file  Occupational History  . Not on file  Tobacco Use  . Smoking status: Former Smoker    Packs/day: 1.00    Years: 40.00    Pack years: 40.00    Types: Cigarettes    Last attempt to quit: 11/23/2016    Years since quitting: 0.2  . Smokeless tobacco: Never Used  . Tobacco comment: HAS STOPPED SMOKING X 2 WKS!!!!!  Substance and Sexual Activity  . Alcohol use: Not on file  . Drug use: Not on file  . Sexual activity: Not on file  Other Topics Concern  . Not on file  Social History Narrative  . Not on file    Family History   . Heart disease Mother - + CAD with CABG at age 84 . Kidney cancer Mother  . Heart disease Father  - CABG and AVR in 70s . Breast cancer Paternal Aunt  . Breast cancer Cousin  . Colon cancer Neg Hx    - 3 sisters - no CAD       Vitals:   02/07/17 1512  BP: 118/72  Pulse: 92  SpO2: 97%  Weight: 95 lb 6.4 oz (43.3 kg)    PHYSICAL EXAM: General:  Very thin. Frail appearing  No resp difficulty HEENT: normal Neck: supple. no JVD. Carotids 2+ bilat; no bruits. No lymphadenopathy or thryomegaly appreciated. Cor: PMI laterally displaced. Regular rate & rhythm. No rubs, gallops or murmurs. Lungs: clear with decreased BS Abdomen: soft, nontender, nondistended. No hepatosplenomegaly. No bruits or masses. Good bowel sounds. Extremities: no cyanosis, clubbing, rash, edema Neuro: alert & orientedx3, cranial nerves grossly intact. moves all 4 extremities w/o difficulty.  Affect pleasant   ASSESSMENT & PLAN: 1. Chronic systolic HF - 10/2016 .  ECHO EF 20-25%.  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 Poor candidate for CABG or PCI due to diffuse CAD - Remains NYHA IIIB. Volume status ok. We recently had to stop spiro with ongoing dizziness.  - Continue digoxin 0.125  - Continue carvedilol 3.125 bid -   Will try losartan 12.36m qhs - CPX reveals moderate to severe HF limitation with very high VEVCO2 slope. Spirometry not too bad. We had long talk about her situation. Not transplant candidate with severe PAD and recent breast CA. I reviewed cath films with interventional team, while it might be technically possible to stent mLAD doubt it would improve her EF much. HVAD may be an option but her size may preclude even the smallest device. Will plan repeat RHC next week. If CO low will give trial of inotropes. If functional capacity improves with inotropes can consider formal VAD evaluation.  - Now that port-a-cath out can consider cMRI 2. 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 S/p Adjuvant chemotherapy followed by left mastectomy and LN dissection 2/18 and XRT. Now complete. 3. Tobacco use -Quit smoking earlier this year 4. Pulmonary Nodules- saw Dr MVaughan Browner CTwith scattered nodules. Thought to be non specific inflammation. - Plan for CT f/uin a year.   Total time spent 45 minutes. Over half that time spent discussing above.    DGlori Bickers MD  3:26 PM

## 2017-02-07 NOTE — H&P (View-Only) (Signed)
ADVANCED HF CLINIC  NOTE  Referring Physician: Gwenlyn Found  Primary Care: Dr. Garlon Hatchet  Primary Cardiologist: Gwenlyn Found   HPI:  Robin Rose is a 61 year old woman with h/o tobacco abuse, breast cancer (s/p chemo and left mastectomy) and recently diagnosed systolic HF.    In 8/17 she was diagnosed with triple-negative breast cancer  with lymph node involvement. She had a clinically staged T4b, N1, M0 cancer which was stage IIIB. Her tumor was ER negative, PR negative and Her-2/neu negative. Her Ki67 was 30%. She underwent neoadjuvant chemotherapy including adriamycin (6 cycles)  followed by left mastectomy in 2/18 and XRT (completed 4/18).   Her EF 11/05/15 was 55% and by MUGA 04/06/16 was 69%. She was admitted to [facility] 08/02/16  with orthopnea and heart failure. She was diuresed 8 pounds. Her EF was 20-25% by echo on 08/03/16. RV was said to be normal. She saw Dr. Gwenlyn Found on 08/19/16. She was started on low-dose carvedilol. Lisinopril and lasix stopped by Dr. Garlon Hatchet on 08/15/16 due to low BP.   She returns for HF follow up with her sister. Continues to feel very weak. NYHA IIIB symptoms. Can do ADLs but very fatigued and winded with much else. No edema, orthopnea, PND. BP on low side but no dizziness. No longer smoking. Denies claudication.   CPX 11/18  FVC 2.01 (66%)    FEV1 1.53 (64%)     FEV1/FVC 76 (96%)     MVV 63 (70%)   Resting HR: 88 Peak HR: 126  (79% age predicted max HR)  BP rest: 124/76 BP peak: 158/70 Peak VO2: 16.8 (67% predicted peak VO2) VE/VCO2 slope: 48 OUES: 0.71 Peak RER: 1.07 Ventilatory Threshold: 12.7 (50% predicted and 76% measured peak VO2) VE/MVV: 51% O2pulse: 7  (100% predicted O2pulse)   ECHO 11/2016 EF 20-25% grade I DD.   RHC/LHC 11/07/2016   Prox RCA lesion, 90 %stenosed.  Post Atrio lesion, 100 %stenosed.  Mid RCA lesion, 30 %stenosed.  Ost Cx to Prox Cx lesion, 80 %stenosed.  1st Mrg lesion, 100 %stenosed.  Dist Cx lesion, 90  %stenosed.  Dist LAD lesion, 80 %stenosed.  Mid LAD-2 lesion, 70 %stenosed.  Mid LAD-1 lesion, 40 %stenosed.  Ost 2nd Diag to 2nd Diag lesion, 99 %stenosed.  Ao = 135/72 (97) LV = 135/15 RA = 1 RV = 28/3 PA = 29/8 (17) PCW = 5 Fick cardiac output/index = 2.6/1.8 Thermo CO/CI = 7.0/5.0 PVR = 4.8 WU FA sat = 92% PA sat = 58%, 60%   PMHx:  . Allergic rhinitis 08/15/2016  . Chronic systolic heart failure ([place]) 08/15/2016  . Malignant neoplasm of overlapping sites of left female breast ([place]) 10/19/2015      Current Outpatient Medications  Medication Sig Dispense Refill  . acetaminophen (TYLENOL) 500 MG tablet Take 1,000 mg by mouth 2 (two) times daily as needed for moderate pain or headache.    Marland Kitchen buPROPion (WELLBUTRIN) 100 MG tablet Take 1 & 1/2 tablets by mouth once daily for 3 days. Then increase to 1 & 1/2 tablets twice daily. 180 tablet 1  . carvedilol (COREG) 3.125 MG tablet TAKE 1 TABLET (3.125 MG TOTAL) BY MOUTH 2 (TWO) TIMES DAILY WITH A MEAL. 90 tablet 2  . digoxin (LANOXIN) 0.125 MG tablet Take 0.5 tablets (0.0625 mg total) by mouth daily. 45 tablet 3  . diphenhydrAMINE (BENADRYL) 25 mg capsule Take 25 mg by mouth every 6 (six) hours as needed for allergies.    . furosemide (LASIX) 20  MG tablet Take 20 mg by mouth as needed.    Marland Kitchen MISC NATURAL PRODUCTS PO Take 1 tablet by mouth daily.    . ondansetron (ZOFRAN) 4 MG tablet Take 4 mg by mouth every 4 (four) hours as needed for nausea/vomiting.  2  . oxymetazoline (AFRIN) 0.05 % nasal spray Place 1 spray into both nostrils at bedtime as needed for congestion.    . potassium chloride (K-DUR,KLOR-CON) 10 MEQ tablet Take 40 mEq by mouth daily as needed (swelling). With first dose of furosemide    . Probiotic Product (PROBIOTIC ADVANCED PO) Take 1 capsule by mouth daily.      No current facility-administered medications for this encounter.     Allergies  Allergen Reactions  . Chantix [Varenicline] Nausea And Vomiting    . Codeine     GI Upset, headaches        Social History   Socioeconomic History  . Marital status: Divorced    Spouse name: Not on file  . Number of children: Not on file  . Years of education: Not on file  . Highest education level: Not on file  Social Needs  . Financial resource strain: Not on file  . Food insecurity - worry: Not on file  . Food insecurity - inability: Not on file  . Transportation needs - medical: Not on file  . Transportation needs - non-medical: Not on file  Occupational History  . Not on file  Tobacco Use  . Smoking status: Former Smoker    Packs/day: 1.00    Years: 40.00    Pack years: 40.00    Types: Cigarettes    Last attempt to quit: 11/23/2016    Years since quitting: 0.2  . Smokeless tobacco: Never Used  . Tobacco comment: HAS STOPPED SMOKING X 2 WKS!!!!!  Substance and Sexual Activity  . Alcohol use: Not on file  . Drug use: Not on file  . Sexual activity: Not on file  Other Topics Concern  . Not on file  Social History Narrative  . Not on file    Family History   . Heart disease Mother - + CAD with CABG at age 67 . Kidney cancer Mother  . Heart disease Father  - CABG and AVR in 49s . Breast cancer Paternal Aunt  . Breast cancer Cousin  . Colon cancer Neg Hx    - 3 sisters - no CAD       Vitals:   02/07/17 1512  BP: 118/72  Pulse: 92  SpO2: 97%  Weight: 95 lb 6.4 oz (43.3 kg)    PHYSICAL EXAM: General:  Very thin. Frail appearing  No resp difficulty HEENT: normal Neck: supple. no JVD. Carotids 2+ bilat; no bruits. No lymphadenopathy or thryomegaly appreciated. Cor: PMI laterally displaced. Regular rate & rhythm. No rubs, gallops or murmurs. Lungs: clear with decreased BS Abdomen: soft, nontender, nondistended. No hepatosplenomegaly. No bruits or masses. Good bowel sounds. Extremities: no cyanosis, clubbing, rash, edema Neuro: alert & orientedx3, cranial nerves grossly intact. moves all 4 extremities w/o difficulty.  Affect pleasant   ASSESSMENT & PLAN: 1. Chronic systolic HF - 06/2593 .  ECHO EF 20-25%.  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 Poor candidate for CABG or PCI due to diffuse CAD - Remains NYHA IIIB. Volume status ok. We recently had to stop spiro with ongoing dizziness.  - Continue digoxin 0.125  - Continue carvedilol 3.125 bid -  Will try losartan 12.36m qhs - CPX reveals moderate to severe HF limitation with very high VEVCO2 slope. Spirometry not too bad. We had long talk about her situation. Not transplant candidate with severe PAD and recent breast CA. I reviewed cath films with interventional team, while it might be technically possible to stent mLAD doubt it would improve her EF much. HVAD may be an option but her size may preclude even the smallest device. Will plan repeat RHC next week. If CO low will give trial of inotropes. If functional capacity improves with inotropes can consider formal VAD evaluation.  - Now that port-a-cath out can consider cMRI 2. 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 S/p Adjuvant chemotherapy followed by left mastectomy and LN dissection 2/18 and XRT. Now complete. 3. Tobacco use -Quit smoking earlier this year 4. Pulmonary Nodules- saw Dr MVaughan Browner CTwith scattered nodules. Thought to be non specific inflammation. - Plan for CT f/uin a year.   Total time spent 45 minutes. Over half that time spent discussing above.    DGlori Bickers MD  3:26 PM

## 2017-02-13 ENCOUNTER — Ambulatory Visit (HOSPITAL_COMMUNITY)
Admission: RE | Admit: 2017-02-13 | Discharge: 2017-02-13 | Disposition: A | Discharge status: Home / Self Care | Payer: BLUE CROSS/BLUE SHIELD | Source: Ambulatory Visit | Attending: Internal Medicine | Admitting: Internal Medicine

## 2017-02-13 ENCOUNTER — Encounter (HOSPITAL_COMMUNITY)
Admission: RE | Disposition: A | Discharge status: Home / Self Care | Payer: Self-pay | Source: Ambulatory Visit | Attending: Internal Medicine

## 2017-02-13 ENCOUNTER — Encounter (HOSPITAL_COMMUNITY): Payer: Self-pay | Admitting: Internal Medicine

## 2017-02-13 DIAGNOSIS — Z8249 Family history of ischemic heart disease and other diseases of the circulatory system: Secondary | ICD-10-CM | POA: Diagnosis not present

## 2017-02-13 DIAGNOSIS — R918 Other nonspecific abnormal finding of lung field: Secondary | ICD-10-CM | POA: Insufficient documentation

## 2017-02-13 DIAGNOSIS — Z9221 Personal history of antineoplastic chemotherapy: Secondary | ICD-10-CM | POA: Diagnosis not present

## 2017-02-13 DIAGNOSIS — I5022 Chronic systolic (congestive) heart failure: Secondary | ICD-10-CM | POA: Diagnosis not present

## 2017-02-13 DIAGNOSIS — Z87891 Personal history of nicotine dependence: Secondary | ICD-10-CM | POA: Insufficient documentation

## 2017-02-13 DIAGNOSIS — Z853 Personal history of malignant neoplasm of breast: Secondary | ICD-10-CM | POA: Diagnosis not present

## 2017-02-13 HISTORY — PX: RIGHT HEART CATH: CATH118263

## 2017-02-13 LAB — BASIC METABOLIC PANEL
Anion gap: 8 (ref 5–15)
BUN: 8 mg/dL (ref 6–20)
CALCIUM: 9.3 mg/dL (ref 8.9–10.3)
CHLORIDE: 103 mmol/L (ref 101–111)
CO2: 27 mmol/L (ref 22–32)
CREATININE: 0.58 mg/dL (ref 0.44–1.00)
GFR calc Af Amer: >60 mL/min (ref >60)
GFR calc non Af Amer: >60 mL/min (ref >60)
Glucose, Bld: 97 mg/dL (ref 65–99)
Potassium: 3.9 mmol/L (ref 3.5–5.1)
SODIUM: 138 mmol/L (ref 135–145)

## 2017-02-13 LAB — POCT I-STAT 3, VENOUS BLOOD GAS (G3P V)
ACID-BASE EXCESS: 2 mmol/L (ref 0.0–2.0)
Acid-Base Excess: 2 mmol/L (ref 0.0–2.0)
BICARBONATE: 28.7 mmol/L — AB (ref 20.0–28.0)
Bicarbonate: 28.1 mmol/L — ABNORMAL HIGH (ref 20.0–28.0)
O2 SAT: 64 %
O2 Saturation: 63 %
PCO2 VEN: 48.2 mmHg (ref 44.0–60.0)
PCO2 VEN: 49.4 mmHg (ref 44.0–60.0)
PH VEN: 7.372 (ref 7.250–7.430)
PO2 VEN: 34 mmHg (ref 32.0–45.0)
PO2 VEN: 35 mmHg (ref 32.0–45.0)
TCO2: 30 mmol/L (ref 22–32)
TCO2: 30 mmol/L (ref 22–32)
pH, Ven: 7.374 (ref 7.250–7.430)

## 2017-02-13 LAB — CBC
HCT: 46.6 % — ABNORMAL HIGH (ref 36.0–46.0)
Hemoglobin: 16.4 g/dL — ABNORMAL HIGH (ref 12.0–15.0)
MCH: 34 pg (ref 26.0–34.0)
MCHC: 35.2 g/dL (ref 30.0–36.0)
MCV: 96.5 fL (ref 78.0–100.0)
PLATELETS: 241 10*3/uL (ref 150–400)
RBC: 4.83 MIL/uL (ref 3.87–5.11)
RDW: 12.7 % (ref 11.5–15.5)
WBC: 8 10*3/uL (ref 4.0–10.5)

## 2017-02-13 LAB — PROTIME-INR
INR: 0.89
PROTHROMBIN TIME: 12 s (ref 11.4–15.2)

## 2017-02-13 SURGERY — RIGHT HEART CATH
Anesthesia: LOCAL

## 2017-02-13 MED ORDER — SODIUM CHLORIDE 0.9% FLUSH: 3.0000 mL | Freq: Two times a day (BID) | INTRAVENOUS | Status: DC

## 2017-02-13 MED ORDER — SODIUM CHLORIDE 0.9% FLUSH: 3.0000 mL | INTRAVENOUS | Status: DC | PRN

## 2017-02-13 MED ORDER — FENTANYL CITRATE (PF) 100 MCG/2ML IJ SOLN
INTRAMUSCULAR | Status: DC | PRN
  Administered 2017-02-13: 25 ug via INTRAVENOUS

## 2017-02-13 MED ORDER — ASPIRIN 81 MG PO CHEW
81.0000 mg | CHEWABLE_TABLET | ORAL | Status: AC
  Administered 2017-02-13: 81 mg via ORAL

## 2017-02-13 MED ORDER — SODIUM CHLORIDE 0.9 % IV SOLN: 250.0000 mL | INTRAVENOUS | Status: DC | PRN

## 2017-02-13 MED ORDER — ASPIRIN 81 MG PO CHEW: 81.0000 mg | CHEWABLE_TABLET | ORAL | Status: DC

## 2017-02-13 MED ORDER — HEPARIN (PORCINE) IN NACL 2-0.9 UNIT/ML-% IJ SOLN
INTRAMUSCULAR | Status: AC
  Filled 2017-02-13: qty 1000

## 2017-02-13 MED ORDER — ASPIRIN 81 MG PO CHEW
CHEWABLE_TABLET | ORAL | Status: AC
  Filled 2017-02-13: qty 1

## 2017-02-13 MED ORDER — LIDOCAINE HCL (PF) 1 % IJ SOLN
INTRAMUSCULAR | Status: DC | PRN
  Administered 2017-02-13: 2 mL

## 2017-02-13 MED ORDER — SODIUM CHLORIDE 0.9 % IV SOLN
INTRAVENOUS | Status: DC
Start: 2017-02-13 — End: 2017-02-13

## 2017-02-13 MED ORDER — HEPARIN (PORCINE) IN NACL 2-0.9 UNIT/ML-% IJ SOLN
INTRAMUSCULAR | Status: AC | PRN
  Administered 2017-02-13: 500 mL

## 2017-02-13 MED ORDER — FENTANYL CITRATE (PF) 100 MCG/2ML IJ SOLN
INTRAMUSCULAR | Status: AC
  Filled 2017-02-13: qty 2

## 2017-02-13 MED ORDER — LIDOCAINE HCL (PF) 1 % IJ SOLN
INTRAMUSCULAR | Status: AC
  Filled 2017-02-13: qty 30

## 2017-02-13 MED ORDER — ACETAMINOPHEN 325 MG PO TABS: 650.0000 mg | ORAL_TABLET | ORAL | Status: DC | PRN

## 2017-02-13 MED ORDER — MIDAZOLAM HCL 2 MG/2ML IJ SOLN
INTRAMUSCULAR | Status: AC
  Filled 2017-02-13: qty 2

## 2017-02-13 MED ORDER — ONDANSETRON HCL 4 MG/2ML IJ SOLN: 4.0000 mg | Freq: Four times a day (QID) | INTRAMUSCULAR | Status: DC | PRN

## 2017-02-13 MED ORDER — MIDAZOLAM HCL 2 MG/2ML IJ SOLN
INTRAMUSCULAR | Status: DC | PRN
  Administered 2017-02-13: 1 mg via INTRAVENOUS

## 2017-02-13 SURGICAL SUPPLY — 6 items
CATH BALLN WEDGE 5F 110CM (CATHETERS) ×1 IMPLANT
GUIDEWIRE .025 260CM (WIRE) ×1 IMPLANT
PACK CARDIAC CATHETERIZATION (CUSTOM PROCEDURE TRAY) ×2 IMPLANT
SHEATH GLIDE SLENDER 4/5FR (SHEATH) ×1 IMPLANT
TRANSDUCER W/STOPCOCK (MISCELLANEOUS) ×2 IMPLANT
TUBING CIL FLEX 10 FLL-RA (TUBING) ×2 IMPLANT

## 2017-02-13 NOTE — Interval H&P Note (Signed)
History and Physical Interval Note:  02/13/2017 10:42 AM  Robin Rose  has presented today for surgery, with the diagnosis of hf  The various methods of treatment have been discussed with the patient and family. After consideration of risks, benefits and other options for treatment, the patient has consented to  Procedure(s): RIGHT HEART CATH (N/A) as a surgical intervention .  The patient's history has been reviewed, patient examined, no change in status, stable for surgery.  I have reviewed the patient's chart and labs.  Questions were answered to the patient's satisfaction.     Jared Whorley

## 2017-02-13 NOTE — Discharge Instructions (Signed)
BRACHIAL Site Care Refer to this sheet in the next few weeks. These instructions provide you with information about caring for yourself after your procedure. Your health care provider may also give you more specific instructions. Your treatment has been planned according to current medical practices, but problems sometimes occur. Call your health care provider if you have any problems or questions after your procedure. What can I expect after the procedure? After your procedure, it is typical to have the following:  Bruising at the Brachial site that usually fades within 1-2 weeks.  Blood collecting in the tissue (hematoma) that may be painful to the touch. It should usually decrease in size and tenderness within 1-2 weeks.  Follow these instructions at home:  Take medicines only as directed by your health care provider.  You may shower 24-48 hours after the procedure or as directed by your health care provider. Remove the bandage (dressing) and gently wash the site with plain soap and water. Pat the area dry with a clean towel. Do not rub the site, because this may cause bleeding.  Do not take baths, swim, or use a hot tub until your health care provider approves.  Check your insertion site every day for redness, swelling, or drainage.  Do not apply powder or lotion to the site.  Do not flex or bend the affected arm for 24 hours or as directed by your health care provider.  Do not push or pull heavy objects with the affected arm for 24 hours or as directed by your health care provider.  Do not lift over 10 lb (4.5 kg) for 5 days after your procedure or as directed by your health care provider.  Ask your health care provider when it is okay to: ? Return to work or school. ? Resume usual physical activities or sports. ? Resume sexual activity.  Do not drive home if you are discharged the same day as the procedure. Have someone else drive you.  You may drive 24 hours after the  procedure unless otherwise instructed by your health care provider.  Do not operate machinery or power tools for 24 hours after the procedure.  If your procedure was done as an outpatient procedure, which means that you went home the same day as your procedure, a responsible adult should be with you for the first 24 hours after you arrive home.  Keep all follow-up visits as directed by your health care provider. This is important. Contact a health care provider if:  You have a fever.  You have chills.  You have increased bleeding from the radial site. Hold pressure on the site. Get help right away if:  You have unusual pain at the brachial site.  You have redness, warmth, or swelling at the brahaial site.  You have drainage (other than a small amount of blood on the dressing) from the brachial site.  The brachial site is bleeding, and the bleeding does not stop after 30 minutes of holding steady pressure on the site.  Your arm or hand becomes pale, cool, tingly, or numb. This information is not intended to replace advice given to you by your health care provider. Make sure you discuss any questions you have with your health care provider. Document Released: 04/02/2010 Document Revised: 08/06/2015 Document Reviewed: 09/16/2013 Elsevier Interactive Patient Education  2018 [employer].

## 2017-02-22 ENCOUNTER — Ambulatory Visit: Payer: BLUE CROSS/BLUE SHIELD | Admitting: Cardiovascular Disease

## 2017-02-27 ENCOUNTER — Other Ambulatory Visit: Payer: Self-pay | Admitting: Cardiovascular Disease

## 2017-03-03 ENCOUNTER — Ambulatory Visit: Payer: BLUE CROSS/BLUE SHIELD | Admitting: Cardiovascular Disease

## 2017-03-09 ENCOUNTER — Telehealth (HOSPITAL_COMMUNITY): Payer: Self-pay | Admitting: *Deleted

## 2017-03-09 NOTE — Telephone Encounter (Signed)
Per Dr Haroldine Laws pt needs to start on Milrinone due to cath results.  Due to Insurance regulation and [place] requirements pt can start this in the home since she has not had it in the last 30 days.  She will have to be hosptialized for at least 23 hour obs to get picc placed and be started on Milrinone while be monitored by tele monitor.  Per Dr Haroldine Laws can plan admit on Fri 03/17/17.  Attempted to call and discuss w/pt, no answer and no VM.

## 2017-03-10 ENCOUNTER — Other Ambulatory Visit: Payer: Self-pay

## 2017-03-14 ENCOUNTER — Inpatient Hospital Stay (HOSPITAL_COMMUNITY)
Admission: EM | Admit: 2017-03-14 | Discharge: 2017-03-19 | DRG: 280 | Disposition: A | Discharge status: Home Health | Payer: BLUE CROSS/BLUE SHIELD | Attending: Cardiovascular Disease | Admitting: Cardiovascular Disease

## 2017-03-14 ENCOUNTER — Other Ambulatory Visit: Payer: Self-pay

## 2017-03-14 ENCOUNTER — Emergency Department (HOSPITAL_COMMUNITY): Payer: BLUE CROSS/BLUE SHIELD

## 2017-03-14 DIAGNOSIS — R079 Chest pain, unspecified: Secondary | ICD-10-CM | POA: Diagnosis present

## 2017-03-14 DIAGNOSIS — Z9221 Personal history of antineoplastic chemotherapy: Secondary | ICD-10-CM

## 2017-03-14 DIAGNOSIS — Z87891 Personal history of nicotine dependence: Secondary | ICD-10-CM

## 2017-03-14 DIAGNOSIS — I745 Embolism and thrombosis of iliac artery: Secondary | ICD-10-CM | POA: Diagnosis present

## 2017-03-14 DIAGNOSIS — Z888 Allergy status to other drugs, medicaments and biological substances status: Secondary | ICD-10-CM

## 2017-03-14 DIAGNOSIS — R000 Tachycardia, unspecified: Secondary | ICD-10-CM | POA: Diagnosis not present

## 2017-03-14 DIAGNOSIS — Z923 Personal history of irradiation: Secondary | ICD-10-CM

## 2017-03-14 DIAGNOSIS — R918 Other nonspecific abnormal finding of lung field: Secondary | ICD-10-CM | POA: Diagnosis present

## 2017-03-14 DIAGNOSIS — Z885 Allergy status to narcotic agent status: Secondary | ICD-10-CM

## 2017-03-14 DIAGNOSIS — I255 Ischemic cardiomyopathy: Secondary | ICD-10-CM | POA: Diagnosis present

## 2017-03-14 DIAGNOSIS — I427 Cardiomyopathy due to drug and external agent: Secondary | ICD-10-CM | POA: Diagnosis present

## 2017-03-14 DIAGNOSIS — I509 Heart failure, unspecified: Secondary | ICD-10-CM

## 2017-03-14 DIAGNOSIS — Z79899 Other long term (current) drug therapy: Secondary | ICD-10-CM

## 2017-03-14 DIAGNOSIS — Z23 Encounter for immunization: Secondary | ICD-10-CM

## 2017-03-14 DIAGNOSIS — I214 Non-ST elevation (NSTEMI) myocardial infarction: Secondary | ICD-10-CM | POA: Diagnosis not present

## 2017-03-14 DIAGNOSIS — Z853 Personal history of malignant neoplasm of breast: Secondary | ICD-10-CM

## 2017-03-14 DIAGNOSIS — Z9012 Acquired absence of left breast and nipple: Secondary | ICD-10-CM

## 2017-03-14 DIAGNOSIS — I5023 Acute on chronic systolic (congestive) heart failure: Secondary | ICD-10-CM | POA: Diagnosis present

## 2017-03-14 DIAGNOSIS — T451X5A Adverse effect of antineoplastic and immunosuppressive drugs, initial encounter: Secondary | ICD-10-CM | POA: Diagnosis present

## 2017-03-14 DIAGNOSIS — I739 Peripheral vascular disease, unspecified: Secondary | ICD-10-CM | POA: Diagnosis present

## 2017-03-14 DIAGNOSIS — Z681 Body mass index (BMI) 19 or less, adult: Secondary | ICD-10-CM

## 2017-03-14 DIAGNOSIS — E43 Unspecified severe protein-calorie malnutrition: Secondary | ICD-10-CM | POA: Diagnosis present

## 2017-03-14 DIAGNOSIS — I25118 Atherosclerotic heart disease of native coronary artery with other forms of angina pectoris: Secondary | ICD-10-CM | POA: Diagnosis present

## 2017-03-14 DIAGNOSIS — I493 Ventricular premature depolarization: Secondary | ICD-10-CM | POA: Diagnosis present

## 2017-03-14 DIAGNOSIS — Z720 Tobacco use: Secondary | ICD-10-CM | POA: Diagnosis present

## 2017-03-14 HISTORY — DX: Malignant neoplasm of unspecified site of left female breast: C50.912

## 2017-03-14 HISTORY — DX: Family history of other specified conditions: Z84.89

## 2017-03-14 HISTORY — DX: Heart failure, unspecified: I50.9

## 2017-03-14 HISTORY — DX: Non-ST elevation (NSTEMI) myocardial infarction: I21.4

## 2017-03-14 HISTORY — DX: Other specified postprocedural states: Z98.890

## 2017-03-14 HISTORY — DX: Atherosclerotic heart disease of native coronary artery without angina pectoris: I25.10

## 2017-03-14 HISTORY — DX: Nausea with vomiting, unspecified: R11.2

## 2017-03-14 LAB — CBC
HCT: 41.5 % (ref 36.0–46.0)
HEMATOCRIT: 39.9 % (ref 36.0–46.0)
HEMOGLOBIN: 13.7 g/dL (ref 12.0–15.0)
Hemoglobin: 14.7 g/dL (ref 12.0–15.0)
MCH: 33.3 pg (ref 26.0–34.0)
MCH: 34.3 pg — AB (ref 26.0–34.0)
MCHC: 34.3 g/dL (ref 30.0–36.0)
MCHC: 35.4 g/dL (ref 30.0–36.0)
MCV: 97.1 fL (ref 78.0–100.0)
MCV: 97 fL (ref 78.0–100.0)
PLATELETS: 253 10*3/uL (ref 150–400)
Platelets: 233 10*3/uL (ref 150–400)
RBC: 4.11 MIL/uL (ref 3.87–5.11)
RBC: 4.28 MIL/uL (ref 3.87–5.11)
RDW: 13 % (ref 11.5–15.5)
RDW: 13 % (ref 11.5–15.5)
WBC: 9.3 10*3/uL (ref 4.0–10.5)
WBC: 8.7 10*3/uL (ref 4.0–10.5)

## 2017-03-14 LAB — BASIC METABOLIC PANEL
ANION GAP: 7 (ref 5–15)
BUN: 7 mg/dL (ref 6–20)
CO2: 25 mmol/L (ref 22–32)
Calcium: 8.7 mg/dL — ABNORMAL LOW (ref 8.9–10.3)
Chloride: 102 mmol/L (ref 101–111)
Creatinine, Ser: 0.64 mg/dL (ref 0.44–1.00)
GFR calc Af Amer: >60 mL/min (ref >60)
GFR calc non Af Amer: >60 mL/min (ref >60)
GLUCOSE: 115 mg/dL — AB (ref 65–99)
POTASSIUM: 4 mmol/L (ref 3.5–5.1)
Sodium: 134 mmol/L — ABNORMAL LOW (ref 135–145)

## 2017-03-14 LAB — CREATININE, SERUM
CREATININE: 0.67 mg/dL (ref 0.44–1.00)
GFR calc Af Amer: >60 mL/min (ref >60)
GFR calc non Af Amer: >60 mL/min (ref >60)

## 2017-03-14 LAB — TSH: TSH: 0.611 u[IU]/mL (ref 0.350–4.500)

## 2017-03-14 LAB — I-STAT TROPONIN, ED: Troponin i, poc: 0.36 ng/mL (ref 0.00–0.08)

## 2017-03-14 LAB — TROPONIN I: Troponin I: 2.93 ng/mL (ref <0.03)

## 2017-03-14 LAB — BRAIN NATRIURETIC PEPTIDE: B NATRIURETIC PEPTIDE 5: 363.3 pg/mL — AB (ref 0.0–100.0)

## 2017-03-14 LAB — I-STAT BETA HCG BLOOD, ED (MC, WL, AP ONLY): I-stat hCG, quantitative: <5 m[IU]/mL (ref <5)

## 2017-03-14 MED ORDER — ONDANSETRON HCL 4 MG/2ML IJ SOLN: 4.0000 mg | Freq: Four times a day (QID) | INTRAMUSCULAR | Status: DC | PRN

## 2017-03-14 MED ORDER — NITROGLYCERIN 2 % TD OINT
1.0000 [in_us] | TOPICAL_OINTMENT | Freq: Once | TRANSDERMAL | Status: AC
  Administered 2017-03-14: 1 [in_us] via TOPICAL
  Filled 2017-03-14: qty 1

## 2017-03-14 MED ORDER — HEPARIN BOLUS VIA INFUSION
2500.0000 [IU] | Freq: Once | INTRAVENOUS | Status: AC
  Administered 2017-03-14: 2500 [IU] via INTRAVENOUS
  Filled 2017-03-14: qty 2500

## 2017-03-14 MED ORDER — ONDANSETRON HCL 4 MG/2ML IJ SOLN
4.0000 mg | Freq: Once | INTRAMUSCULAR | Status: DC
Start: 2017-03-14 — End: 2017-03-14

## 2017-03-14 MED ORDER — DIGOXIN 125 MCG PO TABS
0.0625 mg | ORAL_TABLET | Freq: Every day | ORAL | Status: DC
  Administered 2017-03-14 – 2017-03-18 (×4): 0.0625 mg via ORAL
  Filled 2017-03-14 (×6): qty 1

## 2017-03-14 MED ORDER — ALPRAZOLAM 0.25 MG PO TABS
0.2500 mg | ORAL_TABLET | Freq: Two times a day (BID) | ORAL | Status: DC | PRN
  Administered 2017-03-16 – 2017-03-18 (×2): 0.25 mg via ORAL
  Filled 2017-03-14 (×2): qty 1

## 2017-03-14 MED ORDER — FUROSEMIDE 20 MG PO TABS: 20.0000 mg | ORAL_TABLET | Freq: Two times a day (BID) | ORAL | Status: DC | PRN

## 2017-03-14 MED ORDER — HEPARIN (PORCINE) IN NACL 100-0.45 UNIT/ML-% IJ SOLN
900.0000 [IU]/h | INTRAMUSCULAR | Status: DC
Start: 2017-03-14 — End: 2017-03-16
  Administered 2017-03-14: 500 [IU]/h via INTRAVENOUS
  Administered 2017-03-16: 900 [IU]/h via INTRAVENOUS
  Filled 2017-03-14 (×2): qty 250

## 2017-03-14 MED ORDER — ONDANSETRON HCL 4 MG/2ML IJ SOLN: 4.0000 mg | INTRAMUSCULAR | Status: DC | PRN

## 2017-03-14 MED ORDER — PROBIOTIC ADVANCED PO CAPS: ORAL_CAPSULE | Freq: Every day | ORAL | Status: DC

## 2017-03-14 MED ORDER — ZOLPIDEM TARTRATE 5 MG PO TABS
5.0000 mg | ORAL_TABLET | Freq: Every evening | ORAL | Status: DC | PRN
  Administered 2017-03-16 – 2017-03-18 (×3): 5 mg via ORAL
  Filled 2017-03-14 (×3): qty 1

## 2017-03-14 MED ORDER — LOSARTAN POTASSIUM 25 MG PO TABS
12.5000 mg | ORAL_TABLET | Freq: Every day | ORAL | Status: DC
  Administered 2017-03-14 – 2017-03-18 (×5): 12.5 mg via ORAL
  Filled 2017-03-14 (×3): qty 1
  Filled 2017-03-14: qty 0.5
  Filled 2017-03-14: qty 1
  Filled 2017-03-14 (×2): qty 0.5

## 2017-03-14 MED ORDER — DIPHENHYDRAMINE HCL 25 MG PO CAPS: 25.0000 mg | ORAL_CAPSULE | Freq: Four times a day (QID) | ORAL | Status: DC | PRN

## 2017-03-14 MED ORDER — NITROGLYCERIN 0.4 MG SL SUBL: 0.4000 mg | SUBLINGUAL_TABLET | SUBLINGUAL | Status: DC | PRN

## 2017-03-14 MED ORDER — ACETAMINOPHEN 325 MG PO TABS
650.0000 mg | ORAL_TABLET | ORAL | Status: DC | PRN
  Administered 2017-03-14 – 2017-03-16 (×4): 650 mg via ORAL
  Filled 2017-03-14 (×4): qty 2

## 2017-03-14 MED ORDER — POTASSIUM CHLORIDE CRYS ER 20 MEQ PO TBCR
40.0000 meq | EXTENDED_RELEASE_TABLET | Freq: Every day | ORAL | Status: DC | PRN
  Administered 2017-03-19: 40 meq via ORAL
  Filled 2017-03-14: qty 2

## 2017-03-14 MED ORDER — ISOSORBIDE MONONITRATE ER 30 MG PO TB24
30.0000 mg | ORAL_TABLET | Freq: Every day | ORAL | Status: DC
  Administered 2017-03-15 – 2017-03-18 (×4): 30 mg via ORAL
  Filled 2017-03-14 (×4): qty 1

## 2017-03-14 MED ORDER — RISAQUAD PO CAPS
1.0000 | ORAL_CAPSULE | Freq: Every day | ORAL | Status: DC
  Administered 2017-03-16 – 2017-03-18 (×3): 1 via ORAL
  Filled 2017-03-14 (×5): qty 1

## 2017-03-14 MED ORDER — CARVEDILOL 3.125 MG PO TABS
3.1250 mg | ORAL_TABLET | Freq: Two times a day (BID) | ORAL | Status: DC
  Filled 2017-03-14: qty 1

## 2017-03-14 MED ORDER — BUPROPION HCL 75 MG PO TABS
150.0000 mg | ORAL_TABLET | Freq: Every day | ORAL | Status: DC
  Administered 2017-03-14 – 2017-03-18 (×4): 150 mg via ORAL
  Filled 2017-03-14 (×8): qty 2

## 2017-03-14 NOTE — ED Notes (Signed)
Pt pale, diaphoretic. Pt states she feels hot. Complaining of 6/10 headache.

## 2017-03-14 NOTE — Progress Notes (Signed)
ANTICOAGULATION CONSULT NOTE - Initial Consult  Pharmacy Consult for Heparin Indication: chest pain/ACS  Allergies  Allergen Reactions  . Chantix [Varenicline] Nausea And Vomiting  . Codeine Other (See Comments)    GI Upset, headaches      Patient Measurements: Height: 5\' 3"  (160 cm) Weight: 95 lb (43.1 kg) IBW/kg (Calculated) : 52.4 Heparin Dosing Weight: 43 kg   Vital Signs: Temp: 97.8 F (36.6 C) (01/01 1628) BP: 116/76 (01/01 1745) Pulse Rate: 102 (01/01 1745)  Labs: Recent Labs    03/14/17 1700  HGB 13.7  HCT 39.9  PLT 233  CREATININE 0.64    Estimated Creatinine Clearance: 50.9 mL/min (by C-G formula based on SCr of 0.64 mg/dL).   Medical History: No past medical history on file.  Medications:   (Not in a hospital admission)  Assessment: 14 YOF with known severe 3v CAD here substernal chest pain. Pharmacy consulted to start IV heparin for ACS. H/H and Plt wnl. He is not on any anticoagulation prior to admission.   Goal of Therapy:  Heparin level 0.3-0.7 units/ml Monitor platelets by anticoagulation protocol: Yes   Plan:  -Give heparin 2500 units IV bolus, then start heparin infusion at 500 units/hr.  -F/u AM HL -Monitor daily HL, CBC and s/s of bleeding   Albertina Parr, PharmD., BCPS Clinical Pharmacist Pager [phone]

## 2017-03-14 NOTE — ED Notes (Signed)
[place] sandwich bag given. Ok per [employer]

## 2017-03-14 NOTE — ED Triage Notes (Signed)
Patient states chest pain 8/10  x 2 hours ago. EMS called and administrated 3 nitro, 324 ASA, 565ml NS, and 4mg  Zofran. Pain now 2/10.

## 2017-03-14 NOTE — H&P (Signed)
Physician History and Physical     Patient ID: Robin Rose MRN: [id number] DOB/AGE: [birth year] 62 y.o. Admit date: 03/14/2017  Primary Care Physician: [name], MD Primary Cardiologist: Bensimohn  Active Problems:   * No active hospital problems. *   HPI:  62 y.o. with severe ischemic DCM. Followed closely by Dr Jeffie Pollock. Last left heart cath 11/07/16 Showed severe 3VD with total mid RCA with left to right collaterals. No PCI/Stent thought possible Saw PVT in f/u and turned down for CABG I suspect due to small vessels and diffuse disease. Had f/u right Heart cath 02/13/17 for potential milrinone Rx CO/CI 2.7/1.9 with low filling pressures. She finished chemo And radiation for breast cancer a year ago and sees [place] in [place]. Milrinone never started ? Due To need for hospitalization. She is still functional class 3-4 and has more fatigue than volume overload She has not had recent angina. Yesterday and today had SSCP and nausea. Episodes worse tonight And brought to [facility] by EMS relief with Zofran and nitrol Currently pain free  istat Troponin elevated .36  ECG inferoalteral ST depression and PVC's She has PVD with right iliac occlusion unable to do cath From RFA in December Surprisingly no claudication   Review of systems complete and found to be negative unless listed above   No past medical history on file.  No family history on file.  Social History   Socioeconomic History  . Marital status: Divorced    Spouse name: Not on file  . Number of children: Not on file  . Years of education: Not on file  . Highest education level: Not on file  Social Needs  . Financial resource strain: Not on file  . Food insecurity - worry: Not on file  . Food insecurity - inability: Not on file  . Transportation needs - medical: Not on file  . Transportation needs - non-medical: Not on file  Occupational History  . Not on file  Tobacco Use  . Smoking status: Former Smoker   Packs/day: 1.00    Years: 40.00    Pack years: 40.00    Types: Cigarettes    Last attempt to quit: 11/23/2016    Years since quitting: 0.3  . Smokeless tobacco: Never Used  . Tobacco comment: HAS STOPPED SMOKING X 2 WKS!!!!!  Substance and Sexual Activity  . Alcohol use: Not on file  . Drug use: Not on file  . Sexual activity: Not on file  Other Topics Concern  . Not on file  Social History Narrative  . Not on file    ROS: negative for palpitations syncope abdominal pain or edema     (Not in a hospital admission)  Physical Exam: Blood pressure 119/73, pulse 90, temperature 97.8 F (36.6 C), resp. rate (!) 23, height 5\' 3"  (1.6 m), weight 95 lb (43.1 kg), SpO2 97 %.   Affect appropriate Thin frail female  HEENT: normal Neck supple with no adenopathy JVP normal  Right  bruits no thyromegaly Lungs clear with no wheezing and good diaphragmatic motion Heart:  S1/S2 no murmur, no rub, gallop or click PMI enlarged  Abdomen: benighn, BS positve, no tenderness, no AAA no bruit.  No HSM or HJR Distal pulses diminished in right leg decent capillary refill  No edema Neuro non-focal Skin warm and dry No muscular weakness  No current facility-administered medications on file prior to encounter.    Current Outpatient Medications on File Prior to Encounter  Medication Sig Dispense  Refill  . acetaminophen (TYLENOL) 500 MG tablet Take 1,000 mg by mouth daily as needed for moderate pain or headache.     Marland Kitchen buPROPion (WELLBUTRIN) 100 MG tablet Take 1 & 1/2 tablets by mouth once daily for 3 days. Then increase to 1 & 1/2 tablets twice daily. (Patient taking differently: Take 150 mg by mouth daily. ) 180 tablet 1  . carvedilol (COREG) 3.125 MG tablet TAKE 1 TABLET (3.125 MG TOTAL) BY MOUTH 2 (TWO) TIMES DAILY WITH A MEAL. 90 tablet 2  . digoxin (LANOXIN) 0.125 MG tablet Take 0.5 tablets (0.0625 mg total) by mouth daily. 45 tablet 3  . diphenhydrAMINE (BENADRYL) 25 mg capsule Take 25 mg by  mouth every 6 (six) hours as needed for allergies.    . furosemide (LASIX) 20 MG tablet Take 20 mg by mouth 2 (two) times daily as needed for fluid or edema.     Marland Kitchen losartan (COZAAR) 25 MG tablet Take 0.5 tablets (12.5 mg total) by mouth at bedtime. 45 tablet 3  . MISC NATURAL PRODUCTS PO Take 1 tablet by mouth daily.    . ondansetron (ZOFRAN) 4 MG tablet Take 4 mg by mouth every 4 (four) hours as needed for nausea/vomiting.  2  . oxymetazoline (AFRIN) 0.05 % nasal spray Place 1 spray into both nostrils at bedtime as needed for congestion.    . Probiotic Product (PROBIOTIC ADVANCED PO) Take 1 capsule by mouth daily.     . potassium chloride (K-DUR,KLOR-CON) 10 MEQ tablet Take 40 mEq by mouth daily as needed (swelling). With first dose of furosemide      Labs:   Lab Results  Component Value Date   WBC 9.3 03/14/2017   HGB 13.7 03/14/2017   HCT 39.9 03/14/2017   MCV 97.1 03/14/2017   PLT 233 03/14/2017   Recent Labs  Lab 03/14/17 1700  NA 134*  K 4.0  CL 102  CO2 25  BUN 7  CREATININE 0.64  CALCIUM 8.7*  GLUCOSE 115*   No results found for: CKTOTAL, CKMB, CKMBINDEX, TROPONINI  No results found for: CHOL No results found for: HDL No results found for: LDLCALC No results found for: TRIG No results found for: CHOLHDL No results found for: LDLDIRECT     Radiology: Dg Chest 2 View  Result Date: 03/14/2017 CLINICAL DATA:  Chest pain x2 hours ago and last evening. History of breast cancer, chemotherapy and radiation. Former smoker. EXAM: CHEST  2 VIEW COMPARISON:  None. FINDINGS: Pectus excavatum likely contributing its to the hazy appearance of right middle lobe distribution. Pneumonia is not entirely excluded but believed less likely given the pectus deformity. There is cardiomegaly with minimal aortic atherosclerosis. No overt pulmonary edema, effusion or pneumothorax. Left axillary lymph node dissection clips are noted. No acute nor suspicious osseous abnormalities. IMPRESSION: 1.  No active cardiopulmonary disease. 2. Pectus excavatum likely contributes to the hazy pulmonary opacity adjacent to the right heart border. Pneumonia although not entirely excluded is believed less likely as a result. 3. Aortic atherosclerosis. Electronically Signed   By: Ashley Royalty M.D.   On: 03/14/2017 17:29    EKG:SR LVH PVC inferolateral ST depression no ST elevation   ASSESSMENT AND PLAN:   CAD:  Known 3VD turned down for CABG. Small diffusely diseased vessels with total mid RCA And collaterals. Start heparin add isordil 10 tid increase coreg. No role for stress testing and I  Don't think repeat cath immediately needed if troponins trend down and her pain  does not come back Will make NPO in am Check digoxin level with nausea Abdomen is benign   CHF: Functional class 3-4 euvolemic recent right heart cath with low pressures. Milrinone not started Continue current dose lasix and ACE  Breast Cancer:  Post radiation and chemo may have contributed to DCM. Followed in [place]  PVD:  She has carotid bruit and I don't see evaluation Likely right iliac occlusion seen at cath No claudication observe Signed:   Collier Salina Nishan1/03/2017, 7:06 PM

## 2017-03-14 NOTE — ED Notes (Signed)
Patient transported to X-ray 

## 2017-03-14 NOTE — ED Notes (Signed)
Cardiology MD paged regarding pts elevated troponin of 2.93

## 2017-03-14 NOTE — ED Notes (Signed)
Notified pharmacy for meds

## 2017-03-14 NOTE — ED Notes (Signed)
Notified pharmacy for medications 

## 2017-03-14 NOTE — ED Provider Notes (Signed)
[facility] EMERGENCY DEPARTMENT Provider Note  CSN: [id number] Arrival date & time: 03/14/17 1614  Chief Complaint(s) Chest Pain  HPI Robin Rose is a 62 y.o. female   The history is provided by the patient.  Chest Pain   This is a new problem. The current episode started 12 to 24 hours ago. Episode frequency: initially felt it last night for approx 3 hrs. resolved spontaneously. Today, pain started 3 hrs ago. The problem has been gradually improving. The pain is present in the substernal region. The pain is moderate. The quality of the pain is described as pressure-like and heavy. The pain radiates to the left jaw and left shoulder. Duration of episode(s) is 3 hours. The symptoms are aggravated by exertion. Associated symptoms include malaise/fatigue, nausea, shortness of breath and vomiting (NBNB). Pertinent negatives include no cough, no diaphoresis, no fever, no headaches, no leg pain and no lower extremity edema. She has tried nitroglycerin for the symptoms. The treatment provided moderate relief. Risk factors include being elderly.   Given 324mg  ASA and 3 NTG by EMS.  Known severe 3v CAD treated medically.  H/o Breast cancer s/p Chemoradiation. No prior DVT/PE. No leg swelling or pain. H/o CHF with EF 20-25% on digoxin.  Past Medical History No past medical history on file. Patient Active Problem List   Diagnosis Date Noted  . Peripheral arterial disease ([place]) 11/22/2016  . Ischemic cardiomyopathy 11/22/2016  . Tobacco abuse 08/19/2016  . Nonischemic cardiomyopathy ([place]) 08/19/2016   Home Medication(s) Prior to Admission medications   Medication Sig Start Date End Date Taking? Authorizing Provider  acetaminophen (TYLENOL) 500 MG tablet Take 1,000 mg by mouth daily as needed for moderate pain or headache.     [provider]  buPROPion (WELLBUTRIN) 100 MG tablet Take 1 & 1/2 tablets by mouth once daily for 3 days. Then increase to 1 & 1/2  tablets twice daily. 11/22/16   Lorretta Harp, MD  carvedilol (COREG) 3.125 MG tablet TAKE 1 TABLET (3.125 MG TOTAL) BY MOUTH 2 (TWO) TIMES DAILY WITH A MEAL. 12/05/16   Lorretta Harp, MD  digoxin (LANOXIN) 0.125 MG tablet Take 0.5 tablets (0.0625 mg total) by mouth daily. 09/08/16   Bensimhon, Shaune Pascal, MD  diphenhydrAMINE (BENADRYL) 25 mg capsule Take 25 mg by mouth every 6 (six) hours as needed for allergies.    [provider]  furosemide (LASIX) 20 MG tablet Take 20 mg by mouth 2 (two) times daily as needed for fluid or edema.     [provider]  losartan (COZAAR) 25 MG tablet Take 0.5 tablets (12.5 mg total) by mouth at bedtime. 02/07/17   Bensimhon, Shaune Pascal, MD  MISC NATURAL PRODUCTS PO Take 1 tablet by mouth daily.    [provider]  ondansetron (ZOFRAN) 4 MG tablet Take 4 mg by mouth every 4 (four) hours as needed for nausea/vomiting. 09/28/16   [provider]  oxymetazoline (AFRIN) 0.05 % nasal spray Place 1 spray into both nostrils at bedtime as needed for congestion.    [provider]  potassium chloride (K-DUR,KLOR-CON) 10 MEQ tablet Take 40 mEq by mouth daily as needed (swelling). With first dose of furosemide    [provider]  Probiotic Product (PROBIOTIC ADVANCED PO) Take 1 capsule by mouth daily.     [provider]  Past Surgical History  The histories are not reviewed yet. Please review them in the "History" navigator section and refresh this [place]. Family History No family history on file.  Social History Social History   Tobacco Use  . Smoking status: Former Smoker    Packs/day: 1.00    Years: 40.00    Pack years: 40.00    Types: Cigarettes    Last attempt to quit: 11/23/2016    Years since quitting: 0.3  . Smokeless tobacco: Never Used  . Tobacco comment: HAS  STOPPED SMOKING X 2 WKS!!!!!  Substance Use Topics  . Alcohol use: Not on file  . Drug use: Not on file   Allergies Chantix [varenicline] and Codeine  Review of Systems Review of Systems  Constitutional: Positive for malaise/fatigue. Negative for diaphoresis and fever.  Respiratory: Positive for shortness of breath. Negative for cough.   Cardiovascular: Positive for chest pain.  Gastrointestinal: Positive for nausea and vomiting (NBNB).  Neurological: Negative for headaches.   All other systems are reviewed and are negative for acute change except as noted in the HPI  Physical Exam Vital Signs  I have reviewed the triage vital signs BP 127/65 (BP Location: Right Arm)   Pulse 79   Temp 97.8 F (36.6 C)   Resp 18   Ht 5\' 3"  (1.6 m)   Wt 43.1 kg (95 lb)   SpO2 98%   BMI 16.83 kg/m   Physical Exam  Constitutional: She is oriented to person, place, and time. She appears well-developed and well-nourished. No distress.  HENT:  Head: Normocephalic and atraumatic.  Nose: Nose normal.  Eyes: Conjunctivae and EOM are normal. Pupils are equal, round, and reactive to light. Right eye exhibits no discharge. Left eye exhibits no discharge. No scleral icterus.  Neck: Normal range of motion. Neck supple.  Cardiovascular: Normal rate and regular rhythm. Exam reveals no gallop and no friction rub.  No murmur heard. Pulmonary/Chest: Effort normal and breath sounds normal. No stridor. No respiratory distress. She has no rales.  Abdominal: Soft. She exhibits no distension. There is no tenderness.  Musculoskeletal: She exhibits no edema or tenderness.  Neurological: She is alert and oriented to person, place, and time.  Skin: Skin is warm and dry. No rash noted. She is not diaphoretic. No erythema.  Psychiatric: She has a normal mood and affect.  Vitals reviewed.   ED Results and Treatments Labs (all labs ordered are listed, but only abnormal results are displayed) Labs Reviewed  BASIC  METABOLIC PANEL - Abnormal; Notable for the following components:      Result Value   Sodium 134 (*)    Glucose, Bld 115 (*)    Calcium 8.7 (*)    All other components within normal limits  BRAIN NATRIURETIC PEPTIDE - Abnormal; Notable for the following components:   B Natriuretic Peptide 363.3 (*)    All other components within normal limits  I-STAT TROPONIN, ED - Abnormal; Notable for the following components:   Troponin i, poc 0.36 (*)    All other components within normal limits  CBC  I-STAT BETA HCG BLOOD, ED (MC, WL, AP ONLY)  EKG  EKG Interpretation  Date/Time:  Tuesday March 14 2017 16:25:09 EST Ventricular Rate:  65 PR Interval:    QRS Duration: 96 QT Interval:  377 QTC Calculation: 392 R Axis:   -53 Text Interpretation:  Sinus rhythm Ventricular trigeminy Left atrial enlargement Left anterior fascicular block LVH with secondary repolarization abnormality Anterior infarct, old ST depr, consider ischemia, inferior leads Confirmed by Addison Lank [phone]) on 03/14/2017 4:54:21 PM      Radiology Dg Chest 2 View  Result Date: 03/14/2017 CLINICAL DATA:  Chest pain x2 hours ago and last evening. History of breast cancer, chemotherapy and radiation. Former smoker. EXAM: CHEST  2 VIEW COMPARISON:  None. FINDINGS: Pectus excavatum likely contributing its to the hazy appearance of right middle lobe distribution. Pneumonia is not entirely excluded but believed less likely given the pectus deformity. There is cardiomegaly with minimal aortic atherosclerosis. No overt pulmonary edema, effusion or pneumothorax. Left axillary lymph node dissection clips are noted. No acute nor suspicious osseous abnormalities. IMPRESSION: 1. No active cardiopulmonary disease. 2. Pectus excavatum likely contributes to the hazy pulmonary opacity adjacent to the right heart border. Pneumonia  although not entirely excluded is believed less likely as a result. 3. Aortic atherosclerosis. Electronically Signed   By: Ashley Royalty M.D.   On: 03/14/2017 17:29   Pertinent labs & imaging results that were available during my care of the patient were reviewed by me and considered in my medical decision making (see chart for details).  Medications Ordered in ED Medications  ondansetron (ZOFRAN) injection 4 mg (not administered)  nitroGLYCERIN (NITROGLYN) 2 % ointment 1 inch (1 inch Topical Given 03/14/17 1707)                                                                                                                                    Procedures Procedures CRITICAL CARE Performed by: Grayce Sessions Rochel Privett Total critical care time: 30 minutes Critical care time was exclusive of separately billable procedures and treating other patients. Critical care was necessary to treat or prevent imminent or life-threatening deterioration. Critical care was time spent personally by me on the following activities: development of treatment plan with patient and/or surrogate as well as nursing, discussions with consultants, evaluation of patient's response to treatment, examination of patient, obtaining history from patient or surrogate, ordering and performing treatments and interventions, ordering and review of laboratory studies, ordering and review of radiographic studies, pulse oximetry and re-evaluation of patient's condition.    (including critical care time)  Medical Decision Making / ED Course I have reviewed the nursing notes for this encounter and the patient's prior records (if available in EHR or on provided paperwork).    Typical chest pain.  EKG with new ST depressions and lateral leads.  Troponin positive at 0.36.  Pain free after nitroglycerin paste.  Started on heparin bolus.  Case discussed with Dr. Meda Coffee from cardiology who will admit patient.  Final Clinical Impression(s) / ED  Diagnoses Final diagnoses:  NSTEMI (non-ST elevated myocardial infarction) [facility])      This chart was dictated using voice recognition software.  Despite best efforts to proofread,  errors can occur which can change the documentation meaning.   Fatima Blank, MD 03/14/17 [phone]

## 2017-03-15 ENCOUNTER — Encounter (HOSPITAL_COMMUNITY): Payer: Self-pay

## 2017-03-15 DIAGNOSIS — I5022 Chronic systolic (congestive) heart failure: Secondary | ICD-10-CM

## 2017-03-15 DIAGNOSIS — I214 Non-ST elevation (NSTEMI) myocardial infarction: Principal | ICD-10-CM

## 2017-03-15 DIAGNOSIS — I5023 Acute on chronic systolic (congestive) heart failure: Secondary | ICD-10-CM

## 2017-03-15 LAB — BASIC METABOLIC PANEL
ANION GAP: 11 (ref 5–15)
BUN: 6 mg/dL (ref 6–20)
CALCIUM: 8.5 mg/dL — AB (ref 8.9–10.3)
CO2: 21 mmol/L — AB (ref 22–32)
Chloride: 101 mmol/L (ref 101–111)
Creatinine, Ser: 0.63 mg/dL (ref 0.44–1.00)
GFR calc Af Amer: >60 mL/min (ref >60)
GFR calc non Af Amer: >60 mL/min (ref >60)
GLUCOSE: 161 mg/dL — AB (ref 65–99)
POTASSIUM: 3.9 mmol/L (ref 3.5–5.1)
Sodium: 133 mmol/L — ABNORMAL LOW (ref 135–145)

## 2017-03-15 LAB — HIV ANTIBODY (ROUTINE TESTING W REFLEX): HIV SCREEN 4TH GENERATION: NONREACTIVE

## 2017-03-15 LAB — COMPREHENSIVE METABOLIC PANEL
ALK PHOS: 67 U/L (ref 38–126)
ALT: 23 U/L (ref 14–54)
AST: 77 U/L — AB (ref 15–41)
Albumin: 3.5 g/dL (ref 3.5–5.0)
Anion gap: 11 (ref 5–15)
BUN: 6 mg/dL (ref 6–20)
CALCIUM: 8.8 mg/dL — AB (ref 8.9–10.3)
CHLORIDE: 103 mmol/L (ref 101–111)
CO2: 21 mmol/L — ABNORMAL LOW (ref 22–32)
CREATININE: 0.57 mg/dL (ref 0.44–1.00)
GFR calc Af Amer: >60 mL/min (ref >60)
Glucose, Bld: 108 mg/dL — ABNORMAL HIGH (ref 65–99)
Potassium: 3.6 mmol/L (ref 3.5–5.1)
Sodium: 135 mmol/L (ref 135–145)
Total Bilirubin: 0.4 mg/dL (ref 0.3–1.2)
Total Protein: 5.8 g/dL — ABNORMAL LOW (ref 6.5–8.1)

## 2017-03-15 LAB — CBC
HEMATOCRIT: 42.2 % (ref 36.0–46.0)
HEMOGLOBIN: 14.6 g/dL (ref 12.0–15.0)
MCH: 33.6 pg (ref 26.0–34.0)
MCHC: 34.6 g/dL (ref 30.0–36.0)
MCV: 97 fL (ref 78.0–100.0)
Platelets: 203 10*3/uL (ref 150–400)
RBC: 4.35 MIL/uL (ref 3.87–5.11)
RDW: 13 % (ref 11.5–15.5)
WBC: 7.2 10*3/uL (ref 4.0–10.5)

## 2017-03-15 LAB — HEPARIN LEVEL (UNFRACTIONATED)
Heparin Unfractionated: 0.19 IU/mL — ABNORMAL LOW (ref 0.30–0.70)
Heparin Unfractionated: 0.25 IU/mL — ABNORMAL LOW (ref 0.30–0.70)

## 2017-03-15 LAB — TROPONIN I
Troponin I: 8.2 ng/mL (ref <0.03)
Troponin I: 6.89 ng/mL (ref <0.03)
Troponin I: 6.07 ng/mL (ref <0.03)

## 2017-03-15 MED ORDER — PNEUMOCOCCAL VAC POLYVALENT 25 MCG/0.5ML IJ INJ
0.5000 mL | INJECTION | INTRAMUSCULAR | Status: AC
  Administered 2017-03-17: 0.5 mL via INTRAMUSCULAR
  Filled 2017-03-15: qty 0.5

## 2017-03-15 MED ORDER — MILRINONE LACTATE IN DEXTROSE 20-5 MG/100ML-% IV SOLN
0.2500 ug/kg/min | INTRAVENOUS | Status: DC
  Filled 2017-03-15: qty 100

## 2017-03-15 MED ORDER — ASPIRIN 81 MG PO CHEW
81.0000 mg | CHEWABLE_TABLET | Freq: Every day | ORAL | Status: DC
  Administered 2017-03-15 – 2017-03-19 (×5): 81 mg via ORAL
  Filled 2017-03-15 (×5): qty 1

## 2017-03-15 MED ORDER — HEPARIN BOLUS VIA INFUSION
1500.0000 [IU] | Freq: Once | INTRAVENOUS | Status: AC
  Administered 2017-03-15: 1500 [IU] via INTRAVENOUS
  Filled 2017-03-15: qty 1500

## 2017-03-15 MED ORDER — ENSURE ENLIVE PO LIQD: 237.0000 mL | Freq: Two times a day (BID) | ORAL | Status: DC

## 2017-03-15 MED ORDER — ATORVASTATIN CALCIUM 80 MG PO TABS
80.0000 mg | ORAL_TABLET | Freq: Every day | ORAL | Status: DC
  Administered 2017-03-15 – 2017-03-18 (×4): 80 mg via ORAL
  Filled 2017-03-15 (×4): qty 1

## 2017-03-15 NOTE — ED Notes (Signed)
Pt reporting headache and back ache

## 2017-03-15 NOTE — ED Notes (Signed)
Two grams sodium diet lunch tray ordered @1118 

## 2017-03-15 NOTE — ED Notes (Signed)
Visitor of pt came to desk asking why no one has been in to to talk to them since they started waiting over 22 hours ago. Admitting MD was paged to come and talk to pt and her visitor(s).

## 2017-03-15 NOTE — Progress Notes (Signed)
ANTICOAGULATION CONSULT NOTE - Follow Up Consult  Pharmacy Consult for heparin Indication: NSTEMI  Labs: Recent Labs    03/14/17 1700  03/14/17 2014 03/15/17 0103 03/15/17 0337 03/15/17 0739 03/15/17 1453 03/15/17 1714 03/15/17 2205  HGB 13.7  --  14.7  --  14.6  --   --   --   --   HCT 39.9  --  41.5  --  42.2  --   --   --   --   PLT 233  --  253  --  203  --   --   --   --   HEPARINUNFRC  --   --   --   --  <0.10*  --  0.19*  --  0.25*  CREATININE 0.64  --  0.67 0.57  --  0.63  --   --   --   TROPONINI  --    < > 2.93* 8.20*  --  6.89*  --  6.07*  --    < > = values in this interval not displayed.    Assessment: 62yo female remains subtherapeutic on heparin after rate change though getting closer to goal.  Goal of Therapy:  Heparin level 0.3-0.7 units/ml   Plan:  Will increase heparin gtt by 2 units/kg/hr to 900 units/hr and check level in 8 hours.   Wynona Neat, PharmD, BCPS  03/15/2017,11:27 PM

## 2017-03-15 NOTE — ED Notes (Signed)
Lab called stating cbc was not drawn, CBC not ordered until 0500 along with other morning labs, there for not drawn until then

## 2017-03-15 NOTE — ED Notes (Signed)
Pt up to restroom; will change out to hospital bed

## 2017-03-15 NOTE — Progress Notes (Signed)
ANTICOAGULATION CONSULT NOTE - FOLLOW UP    HL = 0.19 (goal 0.3 - 0.7 units/mL) Heparin dosing weight = 43 kg   Assessment: 61 YOF continues on IV heparin for ACS.  Heparin level is sub-therapeutic but trending up.  No bleeding nor interruption per RN.  CBC WNL.   Plan: Increase heparin gtt to 800 units/hr Check 6 hr heparin level   Saia Derossett D. Mina Marble, PharmD, BCPS 03/15/2017, 4:08 PM

## 2017-03-15 NOTE — ED Notes (Signed)
Lab at bedside collecting heparin level

## 2017-03-15 NOTE — Progress Notes (Signed)
ANTICOAGULATION CONSULT NOTE - Follow Up Consult  Pharmacy Consult for Heparin  Indication: chest pain/ACS  Allergies  Allergen Reactions  . Chantix [Varenicline] Nausea And Vomiting  . Codeine Other (See Comments)    GI Upset, headaches      Patient Measurements: Height: 5\' 3"  (160 cm) Weight: 95 lb (43.1 kg) IBW/kg (Calculated) : 52.4  Vital Signs: BP: 127/74 (01/02 0500) Pulse Rate: 92 (01/02 0500)  Labs: Recent Labs    03/14/17 1700 03/14/17 2014 03/15/17 0103 03/15/17 0337  HGB 13.7 14.7  --  14.6  HCT 39.9 41.5  --  42.2  PLT 233 253  --  203  HEPARINUNFRC  --   --   --  <0.10*  CREATININE 0.64 0.67 0.57  --   TROPONINI  --  2.93* 8.20*  --     Estimated Creatinine Clearance: 50.9 mL/min (by C-G formula based on SCr of 0.57 mg/dL).   Assessment: Heparin for rising troponin, heparin level sub-therapeutic this AM, pt has known 3 vessel CAD  Goal of Therapy:  Heparin level 0.3-0.7 units/ml Monitor platelets by anticoagulation protocol: Yes   Plan:  Heparin 1500 units [employer] heparin drip to 650 units/hr 1400HL  Narda Bonds 03/15/2017,5:49 AM

## 2017-03-15 NOTE — Consult Note (Signed)
Advanced Heart Failure Team Consult Note   Primary Physician: Dr. Garlon Hatchet Primary Cardiologist:  Dr. Haroldine Laws   Reason for Consultation: A/C systolic CHF  HPI:    Robin Rose is seen today for evaluation of A/C systolic CHF at the request of Dr. Johnsie Cancel.   Robin Rose is a 62 y.o. female with h/o tobacco abuse, breast cancer (s/p chemo and left mastectomy) and recently diagnosed systolic HF.    Seen in CHF clinic 02/07/17 with NYHA IIIb symptoms.  Sent for [place] 02/13/17 which showed low cardiac output.  Had planned for Milrinone trial, but per insurance had to be admitted for > 23 hrs prior to starting. Decided to continue medical therapy at that time.   Pt presented to [facility] 03/14/16 with substernal chest pain and nausea.  Relieved with zofran and NTG. Pertinent labs on admission include K 4.0, Creatinine 0.64, BNP 363, WBC 8.7, and Hgb 14.7. Troponin trending up. Peaked at 8.20. Most recent 6.89.    EKG showed inferolateral ST depression and PVCs.  Pt has known 3VD turned down for CABG with small diffusely diseased vessels with total mid RCA and collaterals.  Pt currently feeling better. Denies any further CP since admit. (NTG patch placed on RUE).  Nausea and SOB have improved as well.  Was SOB at home with mild exertion and her ADLs. Denies orthopnea.  + lightheadedness with rapid standing.  Denies sick contact, fever, or chills.  She states the CP yesterday was a 10/10 pressure, that slowly improved after NTG given. This was the first time she has had chest pain. Willing to undergo trial of milrinone and use at home if needed.   [place] 02/13/17  RA = 1 RV = 26/1 PA = 25/7 (16) PCW = 4 Fick cardiac output/index = 2.7/1.9 PVR = 4.4 WU Ao sat = 95% PA sat = 63%, 64%  Echo 10/2016 LVEF 20-25%  LHC 11/07/16   - Not amenable to PCI. Turned down for CABG.  Prox RCA lesion, 90 %stenosed.  Post Atrio lesion, 100 %stenosed.  Mid RCA lesion, 30 %stenosed.  Ost Cx to Prox Cx  lesion, 80 %stenosed.  1st Mrg lesion, 100 %stenosed.  Dist Cx lesion, 90 %stenosed.  Dist LAD lesion, 80 %stenosed.  Mid LAD-2 lesion, 70 %stenosed.  Mid LAD-1 lesion, 40 %stenosed.  Ost 2nd Diag to 2nd Diag lesion, 99 %stenosed.  Review of Systems: [y] = yes, [ ]  = no   General: Weight gain [ ] ; Weight loss [ ] ; Anorexia [ ] ; Fatigue [y]; Fever [ ] ; Chills [ ] ; Weakness [ ]   Cardiac: Chest pain/pressure [y]; Resting SOB [ ] ; Exertional SOB [y]; Orthopnea [ ] ; Pedal Edema [ ] ; Palpitations [ ] ; Syncope [ ] ; Presyncope [ ] ; Paroxysmal nocturnal dyspnea[ ]   Pulmonary: Cough [ ] ; Wheezing[ ] ; Hemoptysis[ ] ; Sputum [ ] ; Snoring [ ]   GI: Vomiting[ ] ; Dysphagia[ ] ; Melena[ ] ; Hematochezia [ ] ; Heartburn[ ] ; Abdominal pain [ ] ; Constipation [ ] ; Diarrhea [ ] ; BRBPR [ ]   GU: Hematuria[ ] ; Dysuria [ ] ; Nocturia[ ]   Vascular: Pain in legs with walking [ ] ; Pain in feet with lying flat [ ] ; Non-healing sores [ ] ; Stroke [ ] ; TIA [ ] ; Slurred speech [ ] ;  Neuro: Headaches[ ] ; Vertigo[ ] ; Seizures[ ] ; Paresthesias[ ] ;Blurred vision [ ] ; Diplopia [ ] ; Vision changes [ ]   Ortho/Skin: Arthritis [y]; Joint pain [y]; Muscle pain [ ] ; Joint swelling [ ] ; Back Pain [ ] ; Rash [ ]   Psych: Depression[ ] ; Anxiety[ ]   Heme: Bleeding problems [ ] ; Clotting disorders [ ] ; Anemia [ ]   Endocrine: Diabetes [ ] ; Thyroid dysfunction[ ]   Home Medications Prior to Admission medications   Medication Sig Start Date End Date Taking? Authorizing Provider  acetaminophen (TYLENOL) 500 MG tablet Take 1,000 mg by mouth daily as needed for moderate pain or headache.    Yes [provider]  buPROPion (WELLBUTRIN) 100 MG tablet Take 1 & 1/2 tablets by mouth once daily for 3 days. Then increase to 1 & 1/2 tablets twice daily. Patient taking differently: Take 150 mg by mouth daily.  11/22/16  Yes Lorretta Harp, MD  carvedilol (COREG) 3.125 MG tablet TAKE 1 TABLET (3.125 MG TOTAL) BY MOUTH 2 (TWO) TIMES DAILY WITH A  MEAL. 12/05/16  Yes Lorretta Harp, MD  digoxin (LANOXIN) 0.125 MG tablet Take 0.5 tablets (0.0625 mg total) by mouth daily. 09/08/16  Yes Bensimhon, Shaune Pascal, MD  diphenhydrAMINE (BENADRYL) 25 mg capsule Take 25 mg by mouth every 6 (six) hours as needed for allergies.   Yes [provider]  furosemide (LASIX) 20 MG tablet Take 20 mg by mouth 2 (two) times daily as needed for fluid or edema.    Yes [provider]  losartan (COZAAR) 25 MG tablet Take 0.5 tablets (12.5 mg total) by mouth at bedtime. 02/07/17  Yes Bensimhon, Shaune Pascal, MD  MISC NATURAL PRODUCTS PO Take 1 tablet by mouth daily.   Yes [provider]  ondansetron (ZOFRAN) 4 MG tablet Take 4 mg by mouth every 4 (four) hours as needed for nausea/vomiting. 09/28/16  Yes [provider]  oxymetazoline (AFRIN) 0.05 % nasal spray Place 1 spray into both nostrils at bedtime as needed for congestion.   Yes [provider]  Probiotic Product (PROBIOTIC ADVANCED PO) Take 1 capsule by mouth daily.    Yes [provider]  potassium chloride (K-DUR,KLOR-CON) 10 MEQ tablet Take 40 mEq by mouth daily as needed (swelling). With first dose of furosemide    [provider]   Past Medical History: . Allergic rhinitis 08/15/2016  . Chronic systolic heart failure ([place]) 08/15/2016  . Malignant neoplasm of overlapping sites of left female breast ([place]) 10/19/2015   Past Surgical History: Past Surgical History:  Procedure Laterality Date  . RIGHT HEART CATH N/A 02/13/2017   Procedure: RIGHT HEART CATH;  Surgeon: Jolaine Artist, MD;  Location: [place] [facility];  Service: Cardiovascular;  Laterality: N/A;  . RIGHT/LEFT HEART CATH AND CORONARY ANGIOGRAPHY N/A 11/07/2016   Procedure: RIGHT/LEFT HEART CATH AND CORONARY ANGIOGRAPHY;  Surgeon: Jolaine Artist, MD;  Location: [place] [facility];  Service: Cardiovascular;  Laterality: N/A;   Family History: . Heart disease Mother - + CAD with  CABG at age 84 . Kidney cancer Mother  . Heart disease Father  - CABG and AVR in 59s . Breast cancer Paternal Aunt  . Breast cancer Cousin  . Colon cancer Neg Hx   - 3 sisters - no CAD   Social History: Social History   Socioeconomic History  . Marital status: Divorced    Spouse name: None  . Number of children: None  . Years of education: None  . Highest education level: None  Social Needs  . Financial resource strain: None  . Food insecurity - worry: None  . Food insecurity - inability: None  . Transportation needs - medical: None  . Transportation needs - non-medical: None  Occupational History  .  None  Tobacco Use  . Smoking status: Former Smoker    Packs/day: 1.00    Years: 40.00    Pack years: 40.00    Types: Cigarettes    Last attempt to quit: 11/23/2016    Years since quitting: 0.3  . Smokeless tobacco: Never Used  . Tobacco comment: HAS STOPPED SMOKING X 2 WKS!!!!!  Substance and Sexual Activity  . Alcohol use: None  . Drug use: None  . Sexual activity: None  Other Topics Concern  . None  Social History Narrative  . None    Allergies:  Allergies  Allergen Reactions  . Chantix [Varenicline] Nausea And Vomiting  . Codeine Other (See Comments)    GI Upset, headaches      Objective:    Vital Signs:   Temp:  [97.8 F (36.6 C)] 97.8 F (36.6 C) (01/01 1628) Pulse Rate:  [59-131] 102 (01/02 1500) Resp:  [11-27] 19 (01/02 1500) BP: (101-139)/(64-94) 101/66 (01/02 1500) SpO2:  [93 %-100 %] 96 % (01/02 1500) Weight:  [95 lb (43.1 kg)] 95 lb (43.1 kg) (01/01 1628)   Weight change: Filed Weights   03/14/17 1628  Weight: 95 lb (43.1 kg)   Intake/Output:   Intake/Output Summary (Last 24 hours) at 03/15/2017 1520 Last data filed at 03/14/2017 1710 Rose per 24 hour  Intake 280 ml  Output -  Net 280 ml    Physical Exam    General:  Very thin. Frail appearing. NAD.  HEENT: normal Neck: supple. JVP 6-7 cm. Carotids 2+ bilat; no bruits. No  lymphadenopathy or thyromegaly appreciated. Cor: PMI lateral.  Regular rate & rhythm. No rubs, gallops or murmurs. Lungs: Clear with decreased BS. Abdomen: soft, nontender, nondistended. No hepatosplenomegaly. No bruits or masses. Good bowel sounds. Extremities: no cyanosis, clubbing, rash, or edema Neuro: alert & orientedx3, cranial nerves grossly intact. moves all 4 extremities w/o difficulty. Affect pleasant  Telemetry   NSR, personally reviewed.  EKG    Sinus arrythmia 78 bpm with PVCs, increased ST depression in V5-V6 compared to last EKG, personally reviewed  Labs   Basic Metabolic Panel: Recent Labs  Lab 03/14/17 1700 03/14/17 2014 03/15/17 0103 03/15/17 0739  NA 134*  --  135 133*  K 4.0  --  3.6 3.9  CL 102  --  103 101  CO2 25  --  21* 21*  GLUCOSE 115*  --  108* 161*  BUN 7  --  6 6  CREATININE 0.64 0.67 0.57 0.63  CALCIUM 8.7*  --  8.8* 8.5*    Liver Function Tests: Recent Labs  Lab 03/15/17 0103  AST 77*  ALT 23  ALKPHOS 67  BILITOT 0.4  PROT 5.8*  ALBUMIN 3.5   No results for input(s): LIPASE, AMYLASE in the last 168 hours. No results for input(s): AMMONIA in the last 168 hours.  CBC: Recent Labs  Lab 03/14/17 1700 03/14/17 2014 03/15/17 0337  WBC 9.3 8.7 7.2  HGB 13.7 14.7 14.6  HCT 39.9 41.5 42.2  MCV 97.1 97.0 97.0  PLT 233 253 203    Cardiac Enzymes: Recent Labs  Lab 03/14/17 2014 03/15/17 0103 03/15/17 0739  TROPONINI 2.93* 8.20* 6.89*    BNP: BNP (last 3 results) Recent Labs    09/08/16 1400 09/19/16 1514 03/14/17 1700  BNP 465.0* 413.5* 363.3*    ProBNP (last 3 results) No results for input(s): PROBNP in the last 8760 hours.   CBG: No results for input(s): GLUCAP in the last 168 hours.  Coagulation Studies: No results for input(s): LABPROT, INR in the last 72 hours.   Imaging   Dg Chest 2 View  Result Date: 03/14/2017 CLINICAL DATA:  Chest pain x2 hours ago and last evening. History of breast cancer,  chemotherapy and radiation. Former smoker. EXAM: CHEST  2 VIEW COMPARISON:  None. FINDINGS: Pectus excavatum likely contributing its to the hazy appearance of right middle lobe distribution. Pneumonia is not entirely excluded but believed less likely given the pectus deformity. There is cardiomegaly with minimal aortic atherosclerosis. No overt pulmonary edema, effusion or pneumothorax. Left axillary lymph node dissection clips are noted. No acute nor suspicious osseous abnormalities. IMPRESSION: 1. No active cardiopulmonary disease. 2. Pectus excavatum likely contributes to the hazy pulmonary opacity adjacent to the right heart border. Pneumonia although not entirely excluded is believed less likely as a result. 3. Aortic atherosclerosis. Electronically Signed   By: Ashley Royalty M.D.   On: 03/14/2017 17:29      Medications:    Current Medications: . acidophilus  1 capsule Oral Daily  . buPROPion  150 mg Oral Daily  . carvedilol  3.125 mg Oral BID WC  . digoxin  0.0625 mg Oral Daily  . isosorbide mononitrate  30 mg Oral Daily  . losartan  12.5 mg Oral QHS     Infusions: . heparin 650 Units/hr (03/15/17 0626)     Patient Profile   Robin Rose is a 62 y.o. female with h/o tobacco abuse, breast cancer (s/p chemo and left mastectomy) and recently diagnosed systolic HF.    Admitted with A/C systolic CHF.   Assessment/Plan   1. NSTEMI with known CAD - Troponin peak at  8.20 - No further chest pain since admission, but she had not had chest pain prior to yesterday.  - LHC 10/2016 with severe 3v CAD without option for PCI or CABG.  May need repeat LHC this admit. Will discuss with MD. 2. Acute on chronic systolic HF - 04/9796 .  ECHO EF 20-25%.  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 Poor candidate for CABG or PCI due to diffuse CAD - NYHA IIIb at home.  - Volume status looks ok on exam - With recent low cardiac  output on RHC 02/13/17 may need to consider milrinone. Worry about increased chance of arrhythmia with NSTEMI. - Continue digoxin 0.125  - Hold BB with concerns of low output.  - Continue losartan 12. 5 mg qhs for now.  - CPX 01/20/17 with moderate to severe HF limitation with very high VEVCO2 slope. Spirometry not too bad. We had long talk about her situation. Not transplant candidate with severe PAD and recent breast CA. - Dr. Haroldine Laws has reviewed cath films with interventional team, while it might be technically possible to stent mLAD doubt it would improve her EF much. HVAD may be an option but her size may preclude even the smallest device. May need to re-look at device this admission.  - Now that port-a-cath out can consider cMRI 3. 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 S/p Adjuvant chemotherapy followed by left mastectomy and LN dissection 2/18 and XRT. Now complete. No change.  4. Tobacco use - Quit smoking earlier this year. No change.  5. Pulmonary Nodules - Has seen Dr. Vaughan Browner. CT with scattered nodules. Thought to be non specific inflammation. - Plan for CT f/uin a year.   With elevated troponin and EKG changes will plan for cath tomorrow.  Hold off on milrinone for now.   Medication concerns reviewed with patient and pharmacy team. Barriers identified: None at this time.  Length of Stay: 0  Annamaria Helling  03/15/2017, 3:20 PM  Advanced Heart Failure Team Pager [phone] (M-F; 7a - 4p)  Please contact [facility] for night-coverage after hours (4p -7a ) and weekends on [email or web address]  Patient seen with PA, agree with the above note.  She was admitted yesterday with about 3 hours of chest pain.  Troponin up to 8, trending down.  ECG with worse inferolateral ST depression.  No chest pain since yesterday.  She has chronic significant fatigue and dyspnea with exertion.  Plan had been to try her on milrinone for mixed ischemic/nonischemic cardiomyopathy with low output on  last RHC.   On exam today, she is not volume overloaded. She is comfortable at rest.  She is in NSR.   Suspect NSTEMI based on troponin elevation and ECG changes.  Known severe 3VD (reviewed recent prior cath) but suspect new event. She does not appear volume overloaded.  - Will continue heparin gtt, ASA 81, atorvastatin 80.   - LHC tomorrow => discussed risks/benefits with patient and she agrees to proceed.   Will not start milrinone yet until we have looked at coronaries, would like to avoid if actively ischemic.  Will hold off on deciding on milrinone until post-cath tomorrow.  - Continue digoxin, check level.  - Continue Coreg and losartan at current doses.   Loralie Champagne 03/15/2017 4:46 PM

## 2017-03-15 NOTE — ED Notes (Signed)
Attempted report 

## 2017-03-16 ENCOUNTER — Encounter (HOSPITAL_COMMUNITY)
Admission: EM | Disposition: A | Discharge status: Home Health | Payer: Self-pay | Source: Home / Self Care | Attending: Cardiovascular Disease

## 2017-03-16 DIAGNOSIS — E43 Unspecified severe protein-calorie malnutrition: Secondary | ICD-10-CM | POA: Diagnosis present

## 2017-03-16 DIAGNOSIS — Z9012 Acquired absence of left breast and nipple: Secondary | ICD-10-CM | POA: Diagnosis not present

## 2017-03-16 DIAGNOSIS — I25118 Atherosclerotic heart disease of native coronary artery with other forms of angina pectoris: Secondary | ICD-10-CM | POA: Diagnosis present

## 2017-03-16 DIAGNOSIS — I214 Non-ST elevation (NSTEMI) myocardial infarction: Secondary | ICD-10-CM | POA: Diagnosis present

## 2017-03-16 DIAGNOSIS — Z79899 Other long term (current) drug therapy: Secondary | ICD-10-CM | POA: Diagnosis not present

## 2017-03-16 DIAGNOSIS — Z87891 Personal history of nicotine dependence: Secondary | ICD-10-CM | POA: Diagnosis not present

## 2017-03-16 DIAGNOSIS — Z9221 Personal history of antineoplastic chemotherapy: Secondary | ICD-10-CM | POA: Diagnosis not present

## 2017-03-16 DIAGNOSIS — I493 Ventricular premature depolarization: Secondary | ICD-10-CM | POA: Diagnosis present

## 2017-03-16 DIAGNOSIS — Z923 Personal history of irradiation: Secondary | ICD-10-CM | POA: Diagnosis not present

## 2017-03-16 DIAGNOSIS — I427 Cardiomyopathy due to drug and external agent: Secondary | ICD-10-CM | POA: Diagnosis present

## 2017-03-16 DIAGNOSIS — R918 Other nonspecific abnormal finding of lung field: Secondary | ICD-10-CM | POA: Diagnosis present

## 2017-03-16 DIAGNOSIS — I739 Peripheral vascular disease, unspecified: Secondary | ICD-10-CM | POA: Diagnosis present

## 2017-03-16 DIAGNOSIS — I5023 Acute on chronic systolic (congestive) heart failure: Secondary | ICD-10-CM

## 2017-03-16 DIAGNOSIS — R000 Tachycardia, unspecified: Secondary | ICD-10-CM | POA: Diagnosis not present

## 2017-03-16 DIAGNOSIS — Z885 Allergy status to narcotic agent status: Secondary | ICD-10-CM | POA: Diagnosis not present

## 2017-03-16 DIAGNOSIS — Z888 Allergy status to other drugs, medicaments and biological substances status: Secondary | ICD-10-CM | POA: Diagnosis not present

## 2017-03-16 DIAGNOSIS — T451X5A Adverse effect of antineoplastic and immunosuppressive drugs, initial encounter: Secondary | ICD-10-CM | POA: Diagnosis present

## 2017-03-16 DIAGNOSIS — Z681 Body mass index (BMI) 19 or less, adult: Secondary | ICD-10-CM | POA: Diagnosis not present

## 2017-03-16 DIAGNOSIS — Z23 Encounter for immunization: Secondary | ICD-10-CM | POA: Diagnosis not present

## 2017-03-16 DIAGNOSIS — I745 Embolism and thrombosis of iliac artery: Secondary | ICD-10-CM | POA: Diagnosis present

## 2017-03-16 DIAGNOSIS — Z853 Personal history of malignant neoplasm of breast: Secondary | ICD-10-CM | POA: Diagnosis not present

## 2017-03-16 DIAGNOSIS — I255 Ischemic cardiomyopathy: Secondary | ICD-10-CM | POA: Diagnosis present

## 2017-03-16 HISTORY — PX: RIGHT/LEFT HEART CATH AND CORONARY ANGIOGRAPHY: CATH118266

## 2017-03-16 HISTORY — PX: ULTRASOUND GUIDANCE FOR VASCULAR ACCESS: SHX6516

## 2017-03-16 LAB — BASIC METABOLIC PANEL
ANION GAP: 8 (ref 5–15)
BUN: 7 mg/dL (ref 6–20)
CHLORIDE: 103 mmol/L (ref 101–111)
CO2: 25 mmol/L (ref 22–32)
Calcium: 8.8 mg/dL — ABNORMAL LOW (ref 8.9–10.3)
Creatinine, Ser: 0.53 mg/dL (ref 0.44–1.00)
GFR calc non Af Amer: >60 mL/min (ref >60)
Glucose, Bld: 91 mg/dL (ref 65–99)
POTASSIUM: 3.6 mmol/L (ref 3.5–5.1)
SODIUM: 136 mmol/L (ref 135–145)

## 2017-03-16 LAB — POCT I-STAT 3, ART BLOOD GAS (G3+)
ACID-BASE DEFICIT: 1 mmol/L (ref 0.0–2.0)
BICARBONATE: 22.4 mmol/L (ref 20.0–28.0)
O2 SAT: 97 %
TCO2: 23 mmol/L (ref 22–32)
pCO2 arterial: 34 mmHg (ref 32.0–48.0)
pH, Arterial: 7.426 (ref 7.350–7.450)
pO2, Arterial: 89 mmHg (ref 83.0–108.0)

## 2017-03-16 LAB — POCT I-STAT 3, VENOUS BLOOD GAS (G3P V)
ACID-BASE DEFICIT: 8 mmol/L — AB (ref 0.0–2.0)
Acid-base deficit: 1 mmol/L (ref 0.0–2.0)
BICARBONATE: 24.3 mmol/L (ref 20.0–28.0)
Bicarbonate: 17.8 mmol/L — ABNORMAL LOW (ref 20.0–28.0)
O2 SAT: 58 %
O2 SAT: 59 %
PCO2 VEN: 42.6 mmHg — AB (ref 44.0–60.0)
PO2 VEN: 32 mmHg (ref 32.0–45.0)
TCO2: 19 mmol/L — AB (ref 22–32)
TCO2: 26 mmol/L (ref 22–32)
pCO2, Ven: 35.1 mmHg — ABNORMAL LOW (ref 44.0–60.0)
pH, Ven: 7.313 (ref 7.250–7.430)
pH, Ven: 7.364 (ref 7.250–7.430)
pO2, Ven: 33 mmHg (ref 32.0–45.0)

## 2017-03-16 LAB — CBC
HEMATOCRIT: 40.9 % (ref 36.0–46.0)
HEMOGLOBIN: 14.2 g/dL (ref 12.0–15.0)
MCH: 34 pg (ref 26.0–34.0)
MCHC: 34.7 g/dL (ref 30.0–36.0)
MCV: 97.8 fL (ref 78.0–100.0)
Platelets: 225 10*3/uL (ref 150–400)
RBC: 4.18 MIL/uL (ref 3.87–5.11)
RDW: 13.1 % (ref 11.5–15.5)
WBC: 6 10*3/uL (ref 4.0–10.5)

## 2017-03-16 LAB — COOXEMETRY PANEL
CARBOXYHEMOGLOBIN: 0.5 % (ref 0.5–1.5)
Methemoglobin: 1.5 % (ref 0.0–1.5)
O2 SAT: 98.6 %
TOTAL HEMOGLOBIN: 14.6 g/dL (ref 12.0–16.0)

## 2017-03-16 LAB — TROPONIN I
Troponin I: 4.98 ng/mL (ref <0.03)
Troponin I: 4.03 ng/mL (ref <0.03)

## 2017-03-16 LAB — DIGOXIN LEVEL

## 2017-03-16 LAB — PROTIME-INR
INR: 0.94
PROTHROMBIN TIME: 12.5 s (ref 11.4–15.2)

## 2017-03-16 LAB — HEPARIN LEVEL (UNFRACTIONATED): Heparin Unfractionated: 0.39 IU/mL (ref 0.30–0.70)

## 2017-03-16 SURGERY — RIGHT/LEFT HEART CATH AND CORONARY ANGIOGRAPHY
Anesthesia: LOCAL

## 2017-03-16 MED ORDER — SODIUM CHLORIDE 0.9% FLUSH: 3.0000 mL | INTRAVENOUS | Status: DC | PRN

## 2017-03-16 MED ORDER — HEPARIN SODIUM (PORCINE) 1000 UNIT/ML IJ SOLN
INTRAMUSCULAR | Status: AC
  Filled 2017-03-16: qty 1

## 2017-03-16 MED ORDER — VERAPAMIL HCL 2.5 MG/ML IV SOLN
INTRAVENOUS | Status: AC
  Filled 2017-03-16: qty 2

## 2017-03-16 MED ORDER — ONDANSETRON HCL 4 MG/2ML IJ SOLN
4.0000 mg | Freq: Four times a day (QID) | INTRAMUSCULAR | Status: DC | PRN
  Administered 2017-03-17 – 2017-03-18 (×2): 4 mg via INTRAVENOUS
  Filled 2017-03-16 (×2): qty 2

## 2017-03-16 MED ORDER — LIDOCAINE HCL (PF) 1 % IJ SOLN
INTRAMUSCULAR | Status: AC
  Filled 2017-03-16: qty 30

## 2017-03-16 MED ORDER — ACETAMINOPHEN 325 MG PO TABS
650.0000 mg | ORAL_TABLET | ORAL | Status: DC | PRN
  Administered 2017-03-16 – 2017-03-19 (×7): 650 mg via ORAL
  Filled 2017-03-16 (×7): qty 2

## 2017-03-16 MED ORDER — IOPAMIDOL (ISOVUE-370) INJECTION 76%
INTRAVENOUS | Status: AC
  Filled 2017-03-16: qty 100

## 2017-03-16 MED ORDER — HEPARIN (PORCINE) IN NACL 2-0.9 UNIT/ML-% IJ SOLN
INTRAMUSCULAR | Status: AC
  Filled 2017-03-16: qty 1000

## 2017-03-16 MED ORDER — SODIUM CHLORIDE 0.9 % IV SOLN: 250.0000 mL | INTRAVENOUS | Status: DC | PRN

## 2017-03-16 MED ORDER — SODIUM CHLORIDE 0.9% FLUSH
3.0000 mL | Freq: Two times a day (BID) | INTRAVENOUS | Status: DC
  Administered 2017-03-16: 3 mL via INTRAVENOUS

## 2017-03-16 MED ORDER — MIDAZOLAM HCL 2 MG/2ML IJ SOLN
INTRAMUSCULAR | Status: DC | PRN
  Administered 2017-03-16: 1 mg via INTRAVENOUS

## 2017-03-16 MED ORDER — SODIUM CHLORIDE 0.9 % IV SOLN: INTRAVENOUS | Status: AC

## 2017-03-16 MED ORDER — SODIUM CHLORIDE 0.9 % IV SOLN
INTRAVENOUS | Status: AC | PRN
  Administered 2017-03-16: 10 mL/h via INTRAVENOUS

## 2017-03-16 MED ORDER — SODIUM CHLORIDE 0.9 % IV SOLN
INTRAVENOUS | Status: DC
  Administered 2017-03-16: 08:00:00 via INTRAVENOUS

## 2017-03-16 MED ORDER — MILRINONE LACTATE IN DEXTROSE 20-5 MG/100ML-% IV SOLN
0.1250 ug/kg/min | INTRAVENOUS | Status: DC
  Administered 2017-03-16 – 2017-03-17 (×2): 0.25 ug/kg/min via INTRAVENOUS
  Filled 2017-03-16: qty 100

## 2017-03-16 MED ORDER — LIDOCAINE HCL (PF) 1 % IJ SOLN
INTRAMUSCULAR | Status: DC | PRN
  Administered 2017-03-16: 15 mL

## 2017-03-16 MED ORDER — MIDAZOLAM HCL 2 MG/2ML IJ SOLN
INTRAMUSCULAR | Status: AC
  Filled 2017-03-16: qty 2

## 2017-03-16 MED ORDER — SODIUM CHLORIDE 0.9% FLUSH
3.0000 mL | Freq: Two times a day (BID) | INTRAVENOUS | Status: DC
  Administered 2017-03-16 – 2017-03-17 (×2): 3 mL via INTRAVENOUS

## 2017-03-16 MED ORDER — LIDOCAINE HCL (PF) 1 % IJ SOLN
INTRAMUSCULAR | Status: DC | PRN
  Administered 2017-03-16: 2 mL
  Administered 2017-03-16: 1 mL

## 2017-03-16 MED ORDER — HEPARIN (PORCINE) IN NACL 2-0.9 UNIT/ML-% IJ SOLN
INTRAMUSCULAR | Status: AC | PRN
  Administered 2017-03-16: 1000 mL

## 2017-03-16 MED ORDER — IOPAMIDOL (ISOVUE-370) INJECTION 76%
INTRAVENOUS | Status: DC | PRN
  Administered 2017-03-16: 50 mL via INTRAVENOUS

## 2017-03-16 MED ORDER — FENTANYL CITRATE (PF) 100 MCG/2ML IJ SOLN
INTRAMUSCULAR | Status: AC
  Filled 2017-03-16: qty 2

## 2017-03-16 MED ORDER — HEPARIN SODIUM (PORCINE) 5000 UNIT/ML IJ SOLN
5000.0000 [IU] | Freq: Three times a day (TID) | INTRAMUSCULAR | Status: DC
  Administered 2017-03-16 – 2017-03-19 (×8): 5000 [IU] via SUBCUTANEOUS
  Filled 2017-03-16 (×8): qty 1

## 2017-03-16 SURGICAL SUPPLY — 16 items
CATH BALLN WEDGE 5F 110CM (CATHETERS) ×1 IMPLANT
CATH INFINITI 5FR MULTPACK ANG (CATHETERS) ×1 IMPLANT
COVER PRB 48X5XTLSCP FOLD TPE (BAG) IMPLANT
COVER PROBE 5X48 (BAG) ×3
GLIDESHEATH SLEND SS 6F .021 (SHEATH) ×1 IMPLANT
GUIDEWIRE .025 260CM (WIRE) ×1 IMPLANT
GUIDEWIRE INQWIRE 1.5J.035X260 (WIRE) IMPLANT
INQWIRE 1.5J .035X260CM (WIRE) ×3
KIT MICROINTRODUCER STIFF 5F (SHEATH) ×1 IMPLANT
KIT PV (KITS) ×1 IMPLANT
PACK CARDIAC CATHETERIZATION (CUSTOM PROCEDURE TRAY) ×3 IMPLANT
SHEATH GLIDE SLENDER 4/5FR (SHEATH) ×1 IMPLANT
SHEATH PINNACLE 5F 10CM (SHEATH) ×1 IMPLANT
TRANSDUCER W/STOPCOCK (MISCELLANEOUS) ×3 IMPLANT
TUBING CIL FLEX 10 FLL-RA (TUBING) ×3 IMPLANT
WIRE EMERALD 3MM-J .035X150CM (WIRE) ×1 IMPLANT

## 2017-03-16 NOTE — Interval H&P Note (Signed)
History and Physical Interval Note:  03/16/2017 12:27 PM  Robin Rose  has presented today for surgery, with the diagnosis of chf, cp  The various methods of treatment have been discussed with the patient and family. After consideration of risks, benefits and other options for treatment, the patient has consented to  Procedure(s): RIGHT/LEFT HEART CATH AND CORONARY ANGIOGRAPHY (N/A) and possible coronary angioplasty as a surgical intervention .  The patient's history has been reviewed, patient examined, no change in status, stable for surgery.  I have reviewed the patient's chart and labs.  Questions were answered to the patient's satisfaction.     Chandani Rogowski

## 2017-03-16 NOTE — Progress Notes (Signed)
ANTICOAGULATION CONSULT NOTE - Follow Up Consult  Pharmacy Consult for heparin Indication: NSTEMI  Labs: Recent Labs    03/14/17 2014 03/15/17 0103  03/15/17 0337 03/15/17 0739 03/15/17 1453 03/15/17 1714 03/15/17 2205 03/16/17 0221 03/16/17 0829  HGB 14.7  --   --  14.6  --   --   --   --  14.2  --   HCT 41.5  --   --  42.2  --   --   --   --  40.9  --   PLT 253  --   --  203  --   --   --   --  225  --   LABPROT  --   --   --   --   --   --   --   --  12.5  --   INR  --   --   --   --   --   --   --   --  0.94  --   HEPARINUNFRC  --   --    < > <0.10*  --  0.19*  --  0.25*  --  0.39  CREATININE 0.67 0.57  --   --  0.63  --   --   --  0.53  --   TROPONINI 2.93* 8.20*  --   --  6.89*  --  6.07* 4.98* 4.03*  --    < > = values in this interval not displayed.    Assessment: 62yo female on IV heparin for ACS. Heparin level came back therapeutic at 0.39, on 900 units/hr. CBC is stable. No infusion issues. No signs/symptoms of bleeding.   Goal of Therapy:  Heparin level 0.3-0.7 units/ml   Plan:  1. Continue heparin infusion at 900 units/hr 2. Check level in 6 hours if not already in cath 3. Monitor daily heparin level and CBC while on heparin 4. Monitor for signs/symptoms of bleeding 5. Follow-up after cath.   Doylene Canard, PharmD Clinical Pharmacist  Pager: [phone] Clinical Phone for 03/16/2016 until 3:30pm: [phone] If after 3:30pm, please call main pharmacy at [phone]  03/16/2017,10:08 AM

## 2017-03-16 NOTE — Progress Notes (Addendum)
Initial Nutrition Assessment  DOCUMENTATION CODES:   Severe malnutrition in context of chronic illness, Underweight  INTERVENTION:    Magic cup TID with meals, each supplement provides 290 kcal and 9 grams of protein  NUTRITION DIAGNOSIS:   Severe Malnutrition related to chronic illness(breast cancer s/p mastectomy) as evidenced by severe fat depletion, severe muscle depletion  GOAL:   Patient will meet greater than or equal to 90% of their needs  MONITOR:   PO intake, Supplement acceptance, Labs, Weight trends, Skin  REASON FOR ASSESSMENT:   Malnutrition Screening Tool  ASSESSMENT:   62 y.o. Female with h/o tobacco abuse, breast cancer (s/p chemo and left mastectomy) and recently diagnosed systolic HF.    Pt reports a good appetite. She states she eats well but cannot gain weight. Reveals she's lost ~ 30 lbs since 02/2016 with chemotherapy.  Doesn't like Ensure or Boost supplements. Says they make her vomit. Amenable to [employer] nutrition supplement on trays. Labs and medications reviewed.  NUTRITION - FOCUSED PHYSICAL EXAM:    Most Recent Value  Orbital Region  Severe depletion  Upper Arm Region  Severe depletion  Thoracic and Lumbar Region  Severe depletion  Buccal Region  Severe depletion  Temple Region  Severe depletion  Clavicle Bone Region  Severe depletion  Clavicle and Acromion Bone Region  Severe depletion  Scapular Bone Region  Unable to assess  Dorsal Hand  Unable to assess  Patellar Region  Severe depletion  Anterior Thigh Region  Severe depletion  Posterior Calf Region  Severe depletion  Edema (RD Assessment)  None     Diet Order:  Diet Heart Room service appropriate? Yes; Fluid consistency: Thin  EDUCATION NEEDS:   No education needs have been identified at this time  Skin:  Skin Assessment: Reviewed RN Assessment  Last BM:  1/1    Intake/Output Summary (Last 24 hours) at 03/16/2017 1646 Last data filed at 03/16/2017 1600 Gross  per 24 hour  Intake 864.94 ml  Output -  Net 864.94 ml   Height:   Ht Readings from Last 1 Encounters:  03/15/17 5\' 3"  (1.6 m)   Weight:   Wt Readings from Last 1 Encounters:  03/16/17 94 lb 4.8 oz (42.8 kg)   Ideal Body Weight:  52.2 kg  BMI:  Body mass index is 16.7 kg/m.  Estimated Nutritional Needs:   Kcal:  1500-1700  Protein:  70-85 gm  Fluid:  1.5-1.7 L  Arthur Holms, RD, LDN Pager #: [phone] After-Hours Pager #: [phone]

## 2017-03-16 NOTE — H&P (View-Only) (Signed)
Advanced Heart Failure Rounding Note   Subjective:    Denies any further CP.  Feels weak.   Remains on heparin. No bleeding.   Objective:   Weight Range:  Vital Signs:   Temp:  [97.4 F (36.3 C)-98.6 F (37 C)] 97.5 F (36.4 C) (01/03 0730) Pulse Rate:  [81-102] 86 (01/03 0730) Resp:  [15-24] 17 (01/03 0730) BP: (95-116)/(65-75) 116/70 (01/03 0730) SpO2:  [93 %-98 %] 98 % (01/03 1201) Weight:  [42.6 kg (94 lb)-42.8 kg (94 lb 4.8 oz)] 42.8 kg (94 lb 4.8 oz) (01/03 0323) Last BM Date: 03/14/16  Weight change: Filed Weights   03/14/17 1628 03/15/17 1700 03/16/17 0323  Weight: 43.1 kg (95 lb) 42.6 kg (94 lb) 42.8 kg (94 lb 4.8 oz)    Intake/Output:   Intake/Output Summary (Last 24 hours) at 03/16/2017 1221 Last data filed at 03/16/2017 0658 Gross per 24 hour  Intake 623.5 ml  Output -  Net 623.5 ml     Physical Exam: General:  Weak appearing. No resp difficulty HEENT: normal Neck: supple. JVP flat . Carotids 2+ bilat; no bruits. No lymphadenopathy or thryomegaly appreciated. Cor: PMI nondisplaced. Regular rate & rhythm. No rubs, gallops or murmurs. Lungs: clear Abdomen: soft, nontender, nondistended. No hepatosplenomegaly. No bruits or masses. Good bowel sounds. Extremities: no cyanosis, clubbing, rash, edema Neuro: alert & orientedx3, cranial nerves grossly intact. moves all 4 extremities w/o difficulty. Affect pleasant  Telemetry: NSR 80s. Personally reviewed   Labs: Basic Metabolic Panel: Recent Labs  Lab 03/14/17 1700 03/14/17 2014 03/15/17 0103 03/15/17 0739 03/16/17 0221  NA 134*  --  135 133* 136  K 4.0  --  3.6 3.9 3.6  CL 102  --  103 101 103  CO2 25  --  21* 21* 25  GLUCOSE 115*  --  108* 161* 91  BUN 7  --  6 6 7   CREATININE 0.64 0.67 0.57 0.63 0.53  CALCIUM 8.7*  --  8.8* 8.5* 8.8*    Liver Function Tests: Recent Labs  Lab 03/15/17 0103  AST 77*  ALT 23  ALKPHOS 67  BILITOT 0.4  PROT 5.8*  ALBUMIN 3.5   No results for  input(s): LIPASE, AMYLASE in the last 168 hours. No results for input(s): AMMONIA in the last 168 hours.  CBC: Recent Labs  Lab 03/14/17 1700 03/14/17 2014 03/15/17 0337 03/16/17 0221  WBC 9.3 8.7 7.2 6.0  HGB 13.7 14.7 14.6 14.2  HCT 39.9 41.5 42.2 40.9  MCV 97.1 97.0 97.0 97.8  PLT 233 253 203 225    Cardiac Enzymes: Recent Labs  Lab 03/15/17 0103 03/15/17 0739 03/15/17 1714 03/15/17 2205 03/16/17 0221  TROPONINI 8.20* 6.89* 6.07* 4.98* 4.03*    BNP: BNP (last 3 results) Recent Labs    09/08/16 1400 09/19/16 1514 03/14/17 1700  BNP 465.0* 413.5* 363.3*    ProBNP (last 3 results) No results for input(s): PROBNP in the last 8760 hours.    Other results:  Imaging: Dg Chest 2 View  Result Date: 03/14/2017 CLINICAL DATA:  Chest pain x2 hours ago and last evening. History of breast cancer, chemotherapy and radiation. Former smoker. EXAM: CHEST  2 VIEW COMPARISON:  None. FINDINGS: Pectus excavatum likely contributing its to the hazy appearance of right middle lobe distribution. Pneumonia is not entirely excluded but believed less likely given the pectus deformity. There is cardiomegaly with minimal aortic atherosclerosis. No overt pulmonary edema, effusion or pneumothorax. Left axillary lymph node dissection clips are  noted. No acute nor suspicious osseous abnormalities. IMPRESSION: 1. No active cardiopulmonary disease. 2. Pectus excavatum likely contributes to the hazy pulmonary opacity adjacent to the right heart border. Pneumonia although not entirely excluded is believed less likely as a result. 3. Aortic atherosclerosis. Electronically Signed   By: Ashley Royalty M.D.   On: 03/14/2017 17:29      Medications:     Scheduled Medications: . [MAR Hold] acidophilus  1 capsule Oral Daily  . [MAR Hold] aspirin  81 mg Oral Daily  . [MAR Hold] atorvastatin  80 mg Oral q1800  . [MAR Hold] buPROPion  150 mg Oral Daily  . [MAR Hold] digoxin  0.0625 mg Oral Daily  . [MAR  Hold] feeding supplement (ENSURE ENLIVE)  237 mL Oral BID BM  . [MAR Hold] isosorbide mononitrate  30 mg Oral Daily  . [MAR Hold] losartan  12.5 mg Oral QHS  . [MAR Hold] pneumococcal 23 valent vaccine  0.5 mL Intramuscular Tomorrow-1000  . sodium chloride flush  3 mL Intravenous Q12H     Infusions: . sodium chloride    . sodium chloride Stopped (03/16/17 1145)  . sodium chloride 10 mL/hr (03/16/17 1206)  . heparin Stopped (03/16/17 1145)  . heparin       PRN Medications:  sodium chloride, sodium chloride, [MAR Hold] acetaminophen, [MAR Hold] ALPRAZolam, [MAR Hold] diphenhydrAMINE, [MAR Hold] furosemide, heparin, [MAR Hold] nitroGLYCERIN, [MAR Hold] ondansetron (ZOFRAN) IV, [MAR Hold] potassium chloride, sodium chloride flush, [MAR Hold] zolpidem   Assessment:   Robin Rose is a 63 y.o. female with h/o tobacco abuse, breast cancer (s/p chemo and left mastectomy) and recently diagnosed systolic HF.   Admitted with NSTEMI and A/C systolic CHF.    Plan/Discussion:    1. NSTEMI with known CAD - Troponin peak at  8.20 - No further chest pain since admission, but she had not had chest pain prior to yesterday.  - LHC 10/2016 with severe 3v CAD without option for PCI or CABG.   - Will repeat today to see if there is intervenable lesion. D/w interventional team. Continue heparin, ASA and statin.. Discussed with PharmD 2. Acute on chronic systolic HF -11/7351 . ECHO EF 20-25%.  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 Poor candidate for CABG or PCI due to diffuse CAD - Volume status looks ok on exam - With recent low cardiac output on RHC 02/13/17 may need to consider milrinone. Worry about increased chance of arrhythmia with NSTEMI. Will repeat R/L cath today - Continue digoxin 0.125  - Hold BB with concerns of low output.  - Continue losartan 12. 5 mg qhs for now.  - CPX 01/20/17 with moderate to severe HF  limitation with very high VEVCO2 slope. Spirometry not too bad. We had long talk about her situation. Not transplant candidate with severe PAD and recent breast CA. -  HVAD may be an option but her size may preclude even the smallest device. May need to re-look at device this admission.  3. 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 S/p Adjuvant chemotherapy followed by left mastectomy and LN dissection 2/18 and XRT. Now complete. No change.  4. Tobacco use - Quit smokingearlier this year. No change.  5. Pulmonary Nodules - Has seen Dr. Vaughan Browner. CT with scattered nodules. Thought to be non specific inflammation.-Planfor CT f/uin a year.      Length of Stay: 0   [name] MD 03/16/2017, 12:21 PM  Advanced  Heart Failure Team Pager [phone] (M-F; [place])  Please contact [facility] for night-coverage after hours (4p -7a ) and weekends on [email or web address]

## 2017-03-16 NOTE — Progress Notes (Addendum)
Site area: LFA Site Prior to Removal:  Level 0 Pressure Applied For: 45 min Manual:   yes Patient Status During Pull:  stable Post Pull Site:  Level  Post Pull Instructions Given:  yes Post Pull Pulses Present: palpable Dressing Applied:  tegaderm Bedrest begins @ 1500 till 1900 Comments: area raised up post 20 min  Golf ball sized area resolved, soft, non raised. Bruising is probable.

## 2017-03-16 NOTE — Progress Notes (Signed)
Advanced Heart Failure Rounding Note   Subjective:    Denies any further CP.  Feels weak.   Remains on heparin. No bleeding.   Objective:   Weight Range:  Vital Signs:   Temp:  [97.4 F (36.3 C)-98.6 F (37 C)] 97.5 F (36.4 C) (01/03 0730) Pulse Rate:  [81-102] 86 (01/03 0730) Resp:  [15-24] 17 (01/03 0730) BP: (95-116)/(65-75) 116/70 (01/03 0730) SpO2:  [93 %-98 %] 98 % (01/03 1201) Weight:  [42.6 kg (94 lb)-42.8 kg (94 lb 4.8 oz)] 42.8 kg (94 lb 4.8 oz) (01/03 0323) Last BM Date: 03/14/16  Weight change: Filed Weights   03/14/17 1628 03/15/17 1700 03/16/17 0323  Weight: 43.1 kg (95 lb) 42.6 kg (94 lb) 42.8 kg (94 lb 4.8 oz)    Intake/Output:   Intake/Output Summary (Last 24 hours) at 03/16/2017 1221 Last data filed at 03/16/2017 0658 Gross per 24 hour  Intake 623.5 ml  Output -  Net 623.5 ml     Physical Exam: General:  Weak appearing. No resp difficulty HEENT: normal Neck: supple. JVP flat . Carotids 2+ bilat; no bruits. No lymphadenopathy or thryomegaly appreciated. Cor: PMI nondisplaced. Regular rate & rhythm. No rubs, gallops or murmurs. Lungs: clear Abdomen: soft, nontender, nondistended. No hepatosplenomegaly. No bruits or masses. Good bowel sounds. Extremities: no cyanosis, clubbing, rash, edema Neuro: alert & orientedx3, cranial nerves grossly intact. moves all 4 extremities w/o difficulty. Affect pleasant  Telemetry: NSR 80s. Personally reviewed   Labs: Basic Metabolic Panel: Recent Labs  Lab 03/14/17 1700 03/14/17 2014 03/15/17 0103 03/15/17 0739 03/16/17 0221  NA 134*  --  135 133* 136  K 4.0  --  3.6 3.9 3.6  CL 102  --  103 101 103  CO2 25  --  21* 21* 25  GLUCOSE 115*  --  108* 161* 91  BUN 7  --  6 6 7   CREATININE 0.64 0.67 0.57 0.63 0.53  CALCIUM 8.7*  --  8.8* 8.5* 8.8*    Liver Function Tests: Recent Labs  Lab 03/15/17 0103  AST 77*  ALT 23  ALKPHOS 67  BILITOT 0.4  PROT 5.8*  ALBUMIN 3.5   No results for  input(s): LIPASE, AMYLASE in the last 168 hours. No results for input(s): AMMONIA in the last 168 hours.  CBC: Recent Labs  Lab 03/14/17 1700 03/14/17 2014 03/15/17 0337 03/16/17 0221  WBC 9.3 8.7 7.2 6.0  HGB 13.7 14.7 14.6 14.2  HCT 39.9 41.5 42.2 40.9  MCV 97.1 97.0 97.0 97.8  PLT 233 253 203 225    Cardiac Enzymes: Recent Labs  Lab 03/15/17 0103 03/15/17 0739 03/15/17 1714 03/15/17 2205 03/16/17 0221  TROPONINI 8.20* 6.89* 6.07* 4.98* 4.03*    BNP: BNP (last 3 results) Recent Labs    09/08/16 1400 09/19/16 1514 03/14/17 1700  BNP 465.0* 413.5* 363.3*    ProBNP (last 3 results) No results for input(s): PROBNP in the last 8760 hours.    Other results:  Imaging: Dg Chest 2 View  Result Date: 03/14/2017 CLINICAL DATA:  Chest pain x2 hours ago and last evening. History of breast cancer, chemotherapy and radiation. Former smoker. EXAM: CHEST  2 VIEW COMPARISON:  None. FINDINGS: Pectus excavatum likely contributing its to the hazy appearance of right middle lobe distribution. Pneumonia is not entirely excluded but believed less likely given the pectus deformity. There is cardiomegaly with minimal aortic atherosclerosis. No overt pulmonary edema, effusion or pneumothorax. Left axillary lymph node dissection clips are  noted. No acute nor suspicious osseous abnormalities. IMPRESSION: 1. No active cardiopulmonary disease. 2. Pectus excavatum likely contributes to the hazy pulmonary opacity adjacent to the right heart border. Pneumonia although not entirely excluded is believed less likely as a result. 3. Aortic atherosclerosis. Electronically Signed   By: Ashley Royalty M.D.   On: 03/14/2017 17:29      Medications:     Scheduled Medications: . [MAR Hold] acidophilus  1 capsule Oral Daily  . [MAR Hold] aspirin  81 mg Oral Daily  . [MAR Hold] atorvastatin  80 mg Oral q1800  . [MAR Hold] buPROPion  150 mg Oral Daily  . [MAR Hold] digoxin  0.0625 mg Oral Daily  . [MAR  Hold] feeding supplement (ENSURE ENLIVE)  237 mL Oral BID BM  . [MAR Hold] isosorbide mononitrate  30 mg Oral Daily  . [MAR Hold] losartan  12.5 mg Oral QHS  . [MAR Hold] pneumococcal 23 valent vaccine  0.5 mL Intramuscular Tomorrow-1000  . sodium chloride flush  3 mL Intravenous Q12H     Infusions: . sodium chloride    . sodium chloride Stopped (03/16/17 1145)  . sodium chloride 10 mL/hr (03/16/17 1206)  . heparin Stopped (03/16/17 1145)  . heparin       PRN Medications:  sodium chloride, sodium chloride, [MAR Hold] acetaminophen, [MAR Hold] ALPRAZolam, [MAR Hold] diphenhydrAMINE, [MAR Hold] furosemide, heparin, [MAR Hold] nitroGLYCERIN, [MAR Hold] ondansetron (ZOFRAN) IV, [MAR Hold] potassium chloride, sodium chloride flush, [MAR Hold] zolpidem   Assessment:   Robin Rose is a 62 y.o. female with h/o tobacco abuse, breast cancer (s/p chemo and left mastectomy) and recently diagnosed systolic HF.   Admitted with NSTEMI and A/C systolic CHF.    Plan/Discussion:    1. NSTEMI with known CAD - Troponin peak at  8.20 - No further chest pain since admission, but she had not had chest pain prior to yesterday.  - LHC 10/2016 with severe 3v CAD without option for PCI or CABG.   - Will repeat today to see if there is intervenable lesion. D/w interventional team. Continue heparin, ASA and statin.. Discussed with PharmD 2. Acute on chronic systolic HF -11/3233 . ECHO EF 20-25%.  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 Poor candidate for CABG or PCI due to diffuse CAD - Volume status looks ok on exam - With recent low cardiac output on RHC 02/13/17 may need to consider milrinone. Worry about increased chance of arrhythmia with NSTEMI. Will repeat R/L cath today - Continue digoxin 0.125  - Hold BB with concerns of low output.  - Continue losartan 12. 5 mg qhs for now.  - CPX 01/20/17 with moderate to severe HF  limitation with very high VEVCO2 slope. Spirometry not too bad. We had long talk about her situation. Not transplant candidate with severe PAD and recent breast CA. -  HVAD may be an option but her size may preclude even the smallest device. May need to re-look at device this admission.  3. 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 S/p Adjuvant chemotherapy followed by left mastectomy and LN dissection 2/18 and XRT. Now complete. No change.  4. Tobacco use - Quit smokingearlier this year. No change.  5. Pulmonary Nodules - Has seen Dr. Vaughan Browner. CT with scattered nodules. Thought to be non specific inflammation.-Planfor CT f/uin a year.      Length of Stay: 0   [name] MD 03/16/2017, 12:21 PM  Advanced  Heart Failure Team Pager [phone] (M-F; [place])  Please contact [facility] for night-coverage after hours (4p -7a ) and weekends on [email or web address]

## 2017-03-17 ENCOUNTER — Encounter (HOSPITAL_COMMUNITY): Payer: Self-pay | Admitting: Internal Medicine

## 2017-03-17 ENCOUNTER — Inpatient Hospital Stay (HOSPITAL_COMMUNITY): Payer: BLUE CROSS/BLUE SHIELD

## 2017-03-17 DIAGNOSIS — E43 Unspecified severe protein-calorie malnutrition: Secondary | ICD-10-CM

## 2017-03-17 HISTORY — PX: IR FLUORO GUIDE CV LINE RIGHT: IMG2283

## 2017-03-17 HISTORY — PX: IR US GUIDE VASC ACCESS RIGHT: IMG2390

## 2017-03-17 LAB — COOXEMETRY PANEL
Carboxyhemoglobin: 0.8 % (ref 0.5–1.5)
Methemoglobin: 1.4 % (ref 0.0–1.5)
O2 SAT: 71 %
Total hemoglobin: 14.4 g/dL (ref 12.0–16.0)

## 2017-03-17 LAB — BASIC METABOLIC PANEL
ANION GAP: 10 (ref 5–15)
BUN: 9 mg/dL (ref 6–20)
CALCIUM: 9 mg/dL (ref 8.9–10.3)
CO2: 25 mmol/L (ref 22–32)
Chloride: 101 mmol/L (ref 101–111)
Creatinine, Ser: 0.54 mg/dL (ref 0.44–1.00)
GFR calc Af Amer: >60 mL/min (ref >60)
GLUCOSE: 85 mg/dL (ref 65–99)
Potassium: 3.6 mmol/L (ref 3.5–5.1)
Sodium: 136 mmol/L (ref 135–145)

## 2017-03-17 MED ORDER — LIDOCAINE HCL (PF) 1 % IJ SOLN
INTRAMUSCULAR | Status: DC | PRN
  Administered 2017-03-17: 10 mL

## 2017-03-17 MED ORDER — HEPARIN SOD (PORK) LOCK FLUSH 100 UNIT/ML IV SOLN
INTRAVENOUS | Status: DC | PRN
  Administered 2017-03-17: 500 [IU] via INTRAVENOUS

## 2017-03-17 MED ORDER — LIDOCAINE HCL 1 % IJ SOLN
INTRAMUSCULAR | Status: AC
  Filled 2017-03-17: qty 20

## 2017-03-17 MED ORDER — HEPARIN SOD (PORK) LOCK FLUSH 100 UNIT/ML IV SOLN
INTRAVENOUS | Status: AC
  Filled 2017-03-17: qty 5

## 2017-03-17 NOTE — Care Management Note (Signed)
Case Management Note Marvetta Gibbons RN, BSN Unit 4E-Case Manager [phone]  Patient Details  Name: Robin Rose MRN: [id number] Date of Birth: Aug 23, 1955  Subjective/Objective:   Pt admitted with HF and chest pain s/p cardiac cath- now on IV milrinone- plan for PICC placement                 Action/Plan: PTA Pt lived at home- referral for home IV milrinone and HHHF management- orders have been placed- Pam with [facility] to follow for IV milrinone- spoke with pt at bedside to offer choice for [facility] agency for [facility] needs- per pt she does not have a preference is familiar with [facility] however for ease of everything would like to use [facility] for services- notified Pam with [facility] for referral of HHHF needs - Pam was already following per HF team for home IV milrinone needs. Pam with [facility] will f/u with pt prior to discharge for home IV milrinone.  Per pt no other DME needs- CM will continue to follow.   Expected Discharge Date:                  Expected Discharge Plan:  [place]  In-House Referral:  NA  Discharge planning Services  CM Consult  Post Acute Care Choice:  Home Health, Durable Medical Equipment Choice offered to:  Patient  DME Arranged:  Other see comment, IV pump/equipment DME Agency:  [place] Arranged:  RN, Disease Management [place] Agency:  [place]  Status of Service:  Completed, signed off  If discussed at [place] of Stay Meetings, dates discussed:    Discharge Disposition: home/home health   Additional Comments:  Dawayne Patricia, RN 03/17/2017, 2:41 PM

## 2017-03-17 NOTE — Procedures (Signed)
Placement of right jugular single lumen tunneled central line.  Tip at SVC/RA junction.  Minimal blood loss and no immediate complication.  Catheter is ready to be used.

## 2017-03-17 NOTE — Progress Notes (Signed)
[facility]  Robin Rose is a new pt for [facility] this hospital admission.  [facility] will be providing Home Infusion Pharmacy services for home Milrinone with the AHF team.  [facility] can provide [facility] services based on pt request/preference.   [facility] is prepared for weekend DC if ordered.  If patient discharges after hours, please call [phone].   Larry Sierras 03/17/2017, 12:38 PM

## 2017-03-17 NOTE — Progress Notes (Signed)
Initial Encounter with LVAD Team and MCS Introduction:  Robin Rose is a 62 y.o. female whom  has a past medical history of Breast cancer, left ([place]) (2018), CHF (congestive heart failure) ([place]), Coronary artery disease, Family history of adverse reaction to anesthesia, NSTEMI (non-ST elevated myocardial infarction) ([place]) (03/14/2017), and PONV (postoperative nausea and vomiting).. We have been asked to evaluate the patient for advanced therapies which include Left Ventricular Assist Device implantation.   No results found for: ABORH  No results found for: HGBA1C Lab Results  Component Value Date   CREATININE 0.54 03/17/2017   CREATININE 0.53 03/16/2017   CREATININE 0.63 03/15/2017    VAD educational packet including "A Decision Aid for Left Ventricular Assist Device (LVAD) for Destination Therapy",  "Understanding Your Options with Advanced Heart Failure", "[facility] Patient Agreement for VAD Evaluation and Potential Implantation" consent reviewed in detail with patient and family and left at bedside for continued reference.   Explained that LVAD can be implanted for two indications in the setting of advanced left ventricular heart failure treatment:  Bridge to transplant - used for patients who cannot safely wait for heart transplant without this device.  Or   Destination therapy - used for patients until end of life or recovery of heart function.  Discussed that at this point Robin Rose would be considered for Destination Therpay therapy should she be deemed an acceptable VAD candidate.   Provided brief equipment overview of the [employer] HVAD pump and discussed placement, surgical procedure, peripheral equipment, life-long coumadin therapy, importance of medication adherence and clinic follow up for as long as patient is living on support, life-style modifications, as well as need for caregiver to be successful with this therapy.   The patient verbalized understanding.  We discussed the process of the evaluation period and how a decision was made by the [facility] team whether she would be an appropriate candidate for therapy or not. Evaluation consent was reviewed and given for reference while the patient makes his/her decision to proceed with candidacy evaluation.  Caregiver Support: has a long-time boyfriend, 2 sons, and a sister  Home Inspection Checklist: verified that patient has reliable telephone, running water and electricity in the home.   Advised the patient review the materials, contact either myself or Zada Girt with questions and we will plan on meeting with her at next scheduled clinic appointment. Verbalized she would review the evaluation consent and make a decision regarding the evaluation process.   Session Time: 79 minutes  Balinda Quails RN, [place] Coordinator 24/7 pager [phone]

## 2017-03-17 NOTE — Progress Notes (Signed)
Home Paraenteral Inotropic Therapy : Data Collection Form  Patients name: Robin Rose   Date: 03/17/17  Information below may not be completed by the supplier nor anyone in a Financial relationship with the supplier.  1. Results of invasive hemodynamic monitoring  Cardiac Index Before Inotrope infusion:             1.7              On Inotrope infusion:             2.08            Drug and dose:   Milrinone 0.25 mcg/kg/min.   2. Cardiac medications immediately prior to inotrope infusion (List name, dose, and frequency)  Coreg 3.125 mg BID, Digoxin 0.0625 mg daily, Lasix 20 mg BID as needed, Losartan 12.5 mg qhs.  3. Dose this represent maximum tolerated doses of these medications? Yes.  4. Breathing status Prior to inotrope infusion: Dyspnea at rest  At time of discharge: Dyspnea on moderate  exertion.   5. Initial home prescription Drug and Dose:   Milrinone 0.25 mcg/kg/min for continuous infusion 24/hr day and 7 days/week  6. If continuous infusion is prescribed, have attempts to discontinue inotrope infusion in the hospital failed?   Yes.  7. If intermittent infusion is prescribed, have there been repeated hospitalizations for heart failure which Parenteral inotrope were required? Not applicable.   8. Is patient capable of going to the physician for outpatient evaluation? Yes.   9. Is routine electrocardiographic monitoring required in the Home?  No.   The above statements and any additional explanations included separately are true and accurate and there is documentation present in the patients medical record to support these statements.   Completed by Shirley Friar, PA-C   In instances where this form was completed by an Advanced Practice Provider, please see EMR for physician Co-Signature.

## 2017-03-17 NOTE — Progress Notes (Signed)
RT called to set up CVP monitoring, will continue to monitor.

## 2017-03-17 NOTE — Progress Notes (Signed)
Advanced Heart Failure Rounding Note   Subjective:    Underwent right and left heart cath yesterday. Stable, severe CAD. Moderately depressed CO.   Now on milrinone. Says she doesn't notice much difference. Feels weak. No orthopnea or PND.  Objective:   Weight Range:  Vital Signs:   Temp:  [97.6 F (36.4 C)-98 F (36.7 C)] 97.7 F (36.5 C) (01/04 0718) Pulse Rate:  [0-110] 95 (01/04 0718) Resp:  [9-25] 15 (01/04 0718) BP: (95-170)/(59-127) 104/59 (01/04 0718) SpO2:  [0 %-100 %] 97 % (01/04 0718) Weight:  [93 lb 8 oz (42.4 kg)] 93 lb 8 oz (42.4 kg) (01/04 0447) Last BM Date: 03/16/16  Weight change: Filed Weights   03/15/17 1700 03/16/17 0323 03/17/17 0447  Weight: 94 lb (42.6 kg) 94 lb 4.8 oz (42.8 kg) 93 lb 8 oz (42.4 kg)    Intake/Output:   Intake/Output Summary (Last 24 hours) at 03/17/2017 1151 Last data filed at 03/17/2017 0853 Gross per 24 hour  Intake 402.28 ml  Output 500 ml  Net -97.72 ml     Physical Exam: General:  Weak appearing. No resp difficulty HEENT: normal Neck: supple. JVP flat . Carotids 2+ bilat; no bruits. No lymphadenopathy or thryomegaly appreciated. Cor: PMI laterally displaced. Regular rate & rhythm. +s3 Lungs: clear Abdomen: soft, nontender, nondistended. No hepatosplenomegaly. No bruits or masses. Good bowel sounds. Extremities: no cyanosis, clubbing, rash, edema left groin site no bruit  Or hematoma Neuro: alert & orientedx3, cranial nerves grossly intact. moves all 4 extremities w/o difficulty. Affect pleasant  Telemetry: NSR 80-90s. Personally reviewed   Labs: Basic Metabolic Panel: Recent Labs  Lab 03/14/17 1700 03/14/17 2014 03/15/17 0103 03/15/17 0739 03/16/17 0221 03/17/17 0226  NA 134*  --  135 133* 136 136  K 4.0  --  3.6 3.9 3.6 3.6  CL 102  --  103 101 103 101  CO2 25  --  21* 21* 25 25  GLUCOSE 115*  --  108* 161* 91 85  BUN 7  --  6 6 7 9   CREATININE 0.64 0.67 0.57 0.63 0.53 0.54  CALCIUM 8.7*  --  8.8*  8.5* 8.8* 9.0    Liver Function Tests: Recent Labs  Lab 03/15/17 0103  AST 77*  ALT 23  ALKPHOS 67  BILITOT 0.4  PROT 5.8*  ALBUMIN 3.5   No results for input(s): LIPASE, AMYLASE in the last 168 hours. No results for input(s): AMMONIA in the last 168 hours.  CBC: Recent Labs  Lab 03/14/17 1700 03/14/17 2014 03/15/17 0337 03/16/17 0221  WBC 9.3 8.7 7.2 6.0  HGB 13.7 14.7 14.6 14.2  HCT 39.9 41.5 42.2 40.9  MCV 97.1 97.0 97.0 97.8  PLT 233 253 203 225    Cardiac Enzymes: Recent Labs  Lab 03/15/17 0103 03/15/17 0739 03/15/17 1714 03/15/17 2205 03/16/17 0221  TROPONINI 8.20* 6.89* 6.07* 4.98* 4.03*    BNP: BNP (last 3 results) Recent Labs    09/08/16 1400 09/19/16 1514 03/14/17 1700  BNP 465.0* 413.5* 363.3*    ProBNP (last 3 results) No results for input(s): PROBNP in the last 8760 hours.    Other results:  Imaging: No results found.   Medications:     Scheduled Medications: . acidophilus  1 capsule Oral Daily  . aspirin  81 mg Oral Daily  . atorvastatin  80 mg Oral q1800  . buPROPion  150 mg Oral Daily  . digoxin  0.0625 mg Oral Daily  . heparin  5,000 Units Subcutaneous Q8H  . heparin lock flush      . isosorbide mononitrate  30 mg Oral Daily  . lidocaine      . losartan  12.5 mg Oral QHS  . sodium chloride flush  3 mL Intravenous Q12H    Infusions: . sodium chloride    . milrinone 0.25 mcg/kg/min (03/16/17 1533)    PRN Medications: sodium chloride, acetaminophen, ALPRAZolam, diphenhydrAMINE, furosemide, heparin lock flush, lidocaine (PF), nitroGLYCERIN, ondansetron (ZOFRAN) IV, potassium chloride, sodium chloride flush, zolpidem   Assessment:   Robin Rose is a 62 y.o. female with h/o tobacco abuse, breast cancer (s/p chemo and left mastectomy) and recently diagnosed systolic HF.   Admitted with NSTEMI and A/C systolic CHF.    Plan/Discussion:    1. NSTEMI with known CAD - Troponin peak at 8.20 - Cath 03/16/17  with stable severe 3v CAD not amenable to PCI or CABG. No culprit lesion  - No further chest pain since admission. Continue ASA and statin. No b-blocker due to low output  2. 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 ECHO EF 20-25%.  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 Poor candidate for CABG or PCI due to diffuse CAD - CPX 01/20/17 with moderate to severe HF limitation with very high VEVCO2 slope. Spirometry not too bad.  - Repeat RHC yesterday with low output. Have started milrinone. Will place tunneled cath for trial of home milrinone. VAD team to see today. May not be candidate for VAD with small stature and previous chest XRT. Dr. Prescott Gum has seen. Not transplant candidate with severe PAD and recent breast CA. -  HVAD may be an option but her size may preclude even the smallest device.  3. 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 S/p Adjuvant chemotherapy followed by left mastectomy and LN dissection 2/18 and XRT. Now complete. No change.  4. Tobacco use - Quit smokingearlier this year. No change.  5. Pulmonary Nodules - Has seen Dr. Vaughan Browner. CT with scattered nodules. Thought to be non specific inflammation.-Planfor CT f/uin a year. 6. Severe Protein calorie malnutrition - Nutrition has seen. Supplements ordered.     Length of Stay: 1   Robin Bickers MD 03/17/2017, 11:51 AM  Advanced Heart Failure Team Pager [phone] (M-F; [place])  Please contact [facility] for night-coverage after hours (4p -7a ) and weekends on [email or web address]

## 2017-03-18 LAB — BASIC METABOLIC PANEL
ANION GAP: 9 (ref 5–15)
BUN: 10 mg/dL (ref 6–20)
CO2: 27 mmol/L (ref 22–32)
Calcium: 9 mg/dL (ref 8.9–10.3)
Chloride: 96 mmol/L — ABNORMAL LOW (ref 101–111)
Creatinine, Ser: 0.56 mg/dL (ref 0.44–1.00)
GFR calc Af Amer: >60 mL/min (ref >60)
Glucose, Bld: 126 mg/dL — ABNORMAL HIGH (ref 65–99)
POTASSIUM: 3.5 mmol/L (ref 3.5–5.1)
SODIUM: 132 mmol/L — AB (ref 135–145)

## 2017-03-18 LAB — COOXEMETRY PANEL
CARBOXYHEMOGLOBIN: 1.2 % (ref 0.5–1.5)
METHEMOGLOBIN: 1.1 % (ref 0.0–1.5)
O2 SAT: 58 %
Total hemoglobin: 14.3 g/dL (ref 12.0–16.0)

## 2017-03-18 MED ORDER — SODIUM CHLORIDE 0.9% FLUSH: 10.0000 mL | INTRAVENOUS | Status: DC | PRN

## 2017-03-18 NOTE — Progress Notes (Signed)
Advanced Heart Failure Rounding Note   Subjective:    Underwent right and left heart cath 1/3 Stable, severe CAD. Moderately depressed CO.   Remains on milrinone @ 0.25 Tunneled PICC placed yesterday. Says she doesn't feel much different except her HR jumps up to 150-160 range with any activity which is new. Weak. No CP or SOB.  Seen by VAD team and information provided   Objective:   Weight Range:  Vital Signs:   Temp:  [97.5 F (36.4 C)-98 F (36.7 C)] 97.6 F (36.4 C) (01/05 0450) Pulse Rate:  [90-116] 116 (01/05 0450) Resp:  [16-23] 19 (01/05 0450) BP: (91-126)/(57-92) 100/63 (01/05 0450) SpO2:  [96 %-98 %] 96 % (01/05 0450) Weight:  [42.5 kg (93 lb 11.2 oz)] 42.5 kg (93 lb 11.2 oz) (01/05 0450) Last BM Date: 03/16/17  Weight change: Filed Weights   03/16/17 0323 03/17/17 0447 03/18/17 0450  Weight: 42.8 kg (94 lb 4.8 oz) 42.4 kg (93 lb 8 oz) 42.5 kg (93 lb 11.2 oz)    Intake/Output:   Intake/Output Summary (Last 24 hours) at 03/18/2017 0810 Last data filed at 03/17/2017 2026 Gross per 24 hour  Intake 882.76 ml  Output 300 ml  Net 582.76 ml     Physical Exam: General:  Weak appearing. Thin. No resp difficulty HEENT: normal Neck: supple. no JVD. Carotids 2+ bilat; no bruits. No lymphadenopathy or thryomegaly appreciated. Cor: PMI laterally displaced. Tachy regular . +s3  RIJ tunneled PICC Lungs: clear with decreased BS Abdomen: soft, nontender, nondistended. No hepatosplenomegaly. No bruits or masses. Good bowel sounds. Extremities: no cyanosis, clubbing, rash, edema Neuro: alert & orientedx3, cranial nerves grossly intact. moves all 4 extremities w/o difficulty. Affect pleasant  Telemetry: sinus tach 100-110s with spikes to 150 Personally reviewed   Labs: Basic Metabolic Panel: Recent Labs  Lab 03/15/17 0103 03/15/17 0739 03/16/17 0221 03/17/17 0226 03/18/17 0305  NA 135 133* 136 136 132*  K 3.6 3.9 3.6 3.6 3.5  CL 103 101 103 101 96*  CO2 21*  21* 25 25 27   GLUCOSE 108* 161* 91 85 126*  BUN 6 6 7 9 10   CREATININE 0.57 0.63 0.53 0.54 0.56  CALCIUM 8.8* 8.5* 8.8* 9.0 9.0    Liver Function Tests: Recent Labs  Lab 03/15/17 0103  AST 77*  ALT 23  ALKPHOS 67  BILITOT 0.4  PROT 5.8*  ALBUMIN 3.5   No results for input(s): LIPASE, AMYLASE in the last 168 hours. No results for input(s): AMMONIA in the last 168 hours.  CBC: Recent Labs  Lab 03/14/17 1700 03/14/17 2014 03/15/17 0337 03/16/17 0221  WBC 9.3 8.7 7.2 6.0  HGB 13.7 14.7 14.6 14.2  HCT 39.9 41.5 42.2 40.9  MCV 97.1 97.0 97.0 97.8  PLT 233 253 203 225    Cardiac Enzymes: Recent Labs  Lab 03/15/17 0103 03/15/17 0739 03/15/17 1714 03/15/17 2205 03/16/17 0221  TROPONINI 8.20* 6.89* 6.07* 4.98* 4.03*    BNP: BNP (last 3 results) Recent Labs    09/08/16 1400 09/19/16 1514 03/14/17 1700  BNP 465.0* 413.5* 363.3*    ProBNP (last 3 results) No results for input(s): PROBNP in the last 8760 hours.    Other results:  Imaging: Ir Fluoro Guide Cv Line Right  Result Date: 03/17/2017 INDICATION: 62 year old with acute on chronic heart failure. Patient needs long-term central venous access for milrinone infusion. EXAM: FLUOROSCOPIC AND ULTRASOUND GUIDED PLACEMENT OF A TUNNELED CENTRAL VENOUS CATHETER Physician: Stephan Minister. Anselm Pancoast, MD FLUOROSCOPY TIME:  36 seconds, 1 mGy MEDICATIONS: None ANESTHESIA/SEDATION: None PROCEDURE: Informed consent was obtained for placement of a tunneled central venous catheter. The patient was placed supine on the interventional table. Ultrasound confirmed a patent right internal jugularvein. Ultrasound images were obtained for documentation. The right side of the neck was prepped and draped in a sterile fashion. The right side of the neck was anesthetized with 1% lidocaine. Maximal barrier sterile technique was utilized including caps, mask, sterile gowns, sterile gloves, sterile drape, hand hygiene and skin antiseptic. A small  incision was made with #11 blade scalpel. A 21 gauge needle directed into the right internal jugular vein with ultrasound guidance. A micropuncture dilator set was placed. A single lumen Powerline catheter was selected. The skin below the right clavicle was anesthetized and a small incision was made with an #11 blade scalpel. A subcutaneous tunnel was formed to the vein dermatotomy site. The catheter was brought through the tunnel. The vein dermatotomy site was dilated to accommodate a peel-away sheath. The catheter was placed through the peel-away sheath and directed into the central venous structures. The tip of the catheter was placed at the superior cavoatrial junction with fluoroscopy. Fluoroscopic images were obtained for documentation. Lumen was found to aspirate and flush well. Catheter was flushed with heparinized saline. The vein dermatotomy site was closed using Dermabond. The catheter was secured to the skin using Prolene suture. FINDINGS: Catheter tip at the superior cavoatrial junction. COMPLICATIONS: None IMPRESSION: Successful placement of a right jugular tunneled central venous catheter using ultrasound and fluoroscopic guidance. Electronically Signed   By: Markus Daft M.D.   On: 03/17/2017 12:09   Ir US Guide Vasc Access Right  Result Date: 03/17/2017 INDICATION: 62 year old with acute on chronic heart failure. Patient needs long-term central venous access for milrinone infusion. EXAM: FLUOROSCOPIC AND ULTRASOUND GUIDED PLACEMENT OF A TUNNELED CENTRAL VENOUS CATHETER Physician: Stephan Minister. [place], MD FLUOROSCOPY TIME:  36 seconds, 1 mGy MEDICATIONS: None ANESTHESIA/SEDATION: None PROCEDURE: Informed consent was obtained for placement of a tunneled central venous catheter. The patient was placed supine on the interventional table. Ultrasound confirmed a patent right internal jugularvein. Ultrasound images were obtained for documentation. The right side of the neck was prepped and draped in a sterile  fashion. The right side of the neck was anesthetized with 1% lidocaine. Maximal barrier sterile technique was utilized including caps, mask, sterile gowns, sterile gloves, sterile drape, hand hygiene and skin antiseptic. A small incision was made with #11 blade scalpel. A 21 gauge needle directed into the right internal jugular vein with ultrasound guidance. A micropuncture dilator set was placed. A single lumen Powerline catheter was selected. The skin below the right clavicle was anesthetized and a small incision was made with an #11 blade scalpel. A subcutaneous tunnel was formed to the vein dermatotomy site. The catheter was brought through the tunnel. The vein dermatotomy site was dilated to accommodate a peel-away sheath. The catheter was placed through the peel-away sheath and directed into the central venous structures. The tip of the catheter was placed at the superior cavoatrial junction with fluoroscopy. Fluoroscopic images were obtained for documentation. Lumen was found to aspirate and flush well. Catheter was flushed with heparinized saline. The vein dermatotomy site was closed using Dermabond. The catheter was secured to the skin using Prolene suture. FINDINGS: Catheter tip at the superior cavoatrial junction. COMPLICATIONS: None IMPRESSION: Successful placement of a right jugular tunneled central venous catheter using ultrasound and fluoroscopic guidance. Electronically Signed   By: Quita Skye  Anselm Pancoast M.D.   On: 03/17/2017 12:09     Medications:     Scheduled Medications: . acidophilus  1 capsule Oral Daily  . aspirin  81 mg Oral Daily  . atorvastatin  80 mg Oral q1800  . buPROPion  150 mg Oral Daily  . digoxin  0.0625 mg Oral Daily  . heparin  5,000 Units Subcutaneous Q8H  . isosorbide mononitrate  30 mg Oral Daily  . losartan  12.5 mg Oral QHS  . sodium chloride flush  3 mL Intravenous Q12H    Infusions: . sodium chloride    . milrinone 0.25 mcg/kg/min (03/17/17 1738)    PRN  Medications: sodium chloride, acetaminophen, ALPRAZolam, diphenhydrAMINE, furosemide, heparin lock flush, lidocaine (PF), nitroGLYCERIN, ondansetron (ZOFRAN) IV, potassium chloride, sodium chloride flush, sodium chloride flush, zolpidem   Assessment:   Robin Rose is a 62 y.o. female with h/o tobacco abuse, breast cancer (s/p chemo and left mastectomy) and recently diagnosed systolic HF.   Admitted with NSTEMI and A/C systolic CHF.    Plan/Discussion:    1. NSTEMI with known CAD - Troponin peak at 8.20 - Cath 03/16/17 with stable severe 3v CAD not amenable to PCI or CABG. No culprit lesion  - No further CP. Marland Kitchen Continue ASA and statin. No b-blocker due to low output  2. 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 ECHO EF 20-25%.  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 Poor candidate for CABG or PCI due to diffuse CAD, previous chest radiation, malnutrition and pectus deformity - CPX 01/20/17 with moderate to severe HF limitation with very high VEVCO2 slope. Spirometry not too bad.  - Repeat RHC/LHC yesterday with low output. Have started milrinone and placed tunneled PICC for trial of home milrinone. - Volume satus ok. Co-ox remains marginal at 58% and has profound tachycardia with milrinone. Suspect she will fail inotrope support. Will cut milrinone to 0.125 - VAD team has seen and we discussed. Not transplant candidate with severe PAD and recent breast CA. May not be candidate for VAD with small stature and previous chest XRT. Dr. Prescott Gum has seen. I will revisit with him if she has a positive response to milrinone -  HVAD may be an option but her size may preclude even the smallest device.  3. 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 S/p Adjuvant chemotherapy followed by left mastectomy and LN dissection 2/18 and XRT. Now complete. No change.  4. Tobacco use - Quit smokingearlier this year.  No change.  5. Pulmonary Nodules - Has seen Dr. Vaughan Browner. CT with scattered nodules. Thought to be non specific inflammation.-Planfor CT f/uin a year. 6. Severe Protein calorie malnutrition - Nutrition has seen. Supplements ordered.     Length of Stay: 2   [name] MD 03/18/2017, 8:10 AM  Advanced Heart Failure Team Pager [phone] (M-F; [place])  Please contact [facility] for night-coverage after hours (4p -7a ) and weekends on [email or web address]

## 2017-03-19 LAB — BASIC METABOLIC PANEL
Anion gap: 7 (ref 5–15)
BUN: 7 mg/dL (ref 6–20)
CO2: 26 mmol/L (ref 22–32)
Calcium: 9 mg/dL (ref 8.9–10.3)
Chloride: 97 mmol/L — ABNORMAL LOW (ref 101–111)
Creatinine, Ser: 0.52 mg/dL (ref 0.44–1.00)
GFR calc Af Amer: >60 mL/min (ref >60)
GLUCOSE: 146 mg/dL — AB (ref 65–99)
Potassium: 3.1 mmol/L — ABNORMAL LOW (ref 3.5–5.1)
Sodium: 130 mmol/L — ABNORMAL LOW (ref 135–145)

## 2017-03-19 LAB — COOXEMETRY PANEL
Carboxyhemoglobin: 0.9 % (ref 0.5–1.5)
METHEMOGLOBIN: 1.4 % (ref 0.0–1.5)
O2 Saturation: 70.6 %
Total hemoglobin: 13.3 g/dL (ref 12.0–16.0)

## 2017-03-19 MED ORDER — ATORVASTATIN CALCIUM 80 MG PO TABS: 80.0000 mg | ORAL_TABLET | Freq: Every day | ORAL | 4 refills | Status: DC

## 2017-03-19 MED ORDER — ASPIRIN 81 MG PO CHEW: 81.0000 mg | CHEWABLE_TABLET | Freq: Every day | ORAL | 4 refills | Status: DC

## 2017-03-19 MED ORDER — SODIUM CHLORIDE 0.9 % IV BOLUS (SEPSIS)
250.0000 mL | Freq: Once | INTRAVENOUS | Status: AC
  Administered 2017-03-19: 250 mL via INTRAVENOUS

## 2017-03-19 NOTE — Progress Notes (Signed)
Advanced Heart Failure Rounding Note   Subjective:    Underwent right and left heart cath 1/3 Stable, severe CAD. Moderately depressed CO.   Yesterday had marked tachycardia with any ambulation so milrinone cut back to 0.125. Still tachy. Does not feel milrinone is helping much. Denies CP or SOB. + Fatigued. CVP 1  Seen by VAD team and information provided   Objective:   Weight Range:  Vital Signs:   Temp:  [98.2 F (36.8 C)-98.5 F (36.9 C)] 98.2 F (36.8 C) (01/06 0449) Pulse Rate:  [64-122] 64 (01/06 0600) Resp:  [13-22] 18 (01/06 0600) BP: (89-127)/(56-78) 114/65 (01/06 0600) SpO2:  [94 %-99 %] 94 % (01/06 0600) Weight:  [42.1 kg (92 lb 13 oz)] 42.1 kg (92 lb 13 oz) (01/06 0449) Last BM Date: 03/16/17  Weight change: Filed Weights   03/17/17 0447 03/18/17 0450 03/19/17 0449  Weight: 42.4 kg (93 lb 8 oz) 42.5 kg (93 lb 11.2 oz) 42.1 kg (92 lb 13 oz)    Intake/Output:   Intake/Output Summary (Last 24 hours) at 03/19/2017 0844 Last data filed at 03/19/2017 0452 Gross per 24 hour  Intake 840 ml  Output 1250 ml  Net -410 ml     Physical Exam: General:  Weak appearing. Thin. No resp difficulty HEENT: normal Neck: supple. no JVD. RIJ tunneled cath.  Carotids 2+ bilat; no bruits. No lymphadenopathy or thryomegaly appreciated. Cor: PMI nondisplaced. Tachy regular. +s3 Lungs: clear with decreased BS Abdomen: soft, nontender, nondistended. No hepatosplenomegaly. No bruits or masses. Good bowel sounds. Extremities: no cyanosis, clubbing, rash, edema  thin Neuro: alert & orientedx3, cranial nerves grossly intact. moves all 4 extremities w/o difficulty. Affect pleasant   Telemetry: sinus 80-90s with spikes to 140 Personally reviewed   Labs: Basic Metabolic Panel: Recent Labs  Lab 03/15/17 0739 03/16/17 0221 03/17/17 0226 03/18/17 0305 03/19/17 0412  NA 133* 136 136 132* 130*  K 3.9 3.6 3.6 3.5 3.1*  CL 101 103 101 96* 97*  CO2 21* 25 25 27 26   GLUCOSE 161*  91 85 126* 146*  BUN 6 7 9 10 7   CREATININE 0.63 0.53 0.54 0.56 0.52  CALCIUM 8.5* 8.8* 9.0 9.0 9.0    Liver Function Tests: Recent Labs  Lab 03/15/17 0103  AST 77*  ALT 23  ALKPHOS 67  BILITOT 0.4  PROT 5.8*  ALBUMIN 3.5   No results for input(s): LIPASE, AMYLASE in the last 168 hours. No results for input(s): AMMONIA in the last 168 hours.  CBC: Recent Labs  Lab 03/14/17 1700 03/14/17 2014 03/15/17 0337 03/16/17 0221  WBC 9.3 8.7 7.2 6.0  HGB 13.7 14.7 14.6 14.2  HCT 39.9 41.5 42.2 40.9  MCV 97.1 97.0 97.0 97.8  PLT 233 253 203 225    Cardiac Enzymes: Recent Labs  Lab 03/15/17 0103 03/15/17 0739 03/15/17 1714 03/15/17 2205 03/16/17 0221  TROPONINI 8.20* 6.89* 6.07* 4.98* 4.03*    BNP: BNP (last 3 results) Recent Labs    09/08/16 1400 09/19/16 1514 03/14/17 1700  BNP 465.0* 413.5* 363.3*    ProBNP (last 3 results) No results for input(s): PROBNP in the last 8760 hours.    Other results:  Imaging: Ir Fluoro Guide Cv Line Right  Result Date: 03/17/2017 INDICATION: 62 year old with acute on chronic heart failure. Patient needs long-term central venous access for milrinone infusion. EXAM: FLUOROSCOPIC AND ULTRASOUND GUIDED PLACEMENT OF A TUNNELED CENTRAL VENOUS CATHETER Physician: Stephan Minister. Anselm Pancoast, MD FLUOROSCOPY TIME:  36 seconds, 1  mGy MEDICATIONS: None ANESTHESIA/SEDATION: None PROCEDURE: Informed consent was obtained for placement of a tunneled central venous catheter. The patient was placed supine on the interventional table. Ultrasound confirmed a patent right internal jugularvein. Ultrasound images were obtained for documentation. The right side of the neck was prepped and draped in a sterile fashion. The right side of the neck was anesthetized with 1% lidocaine. Maximal barrier sterile technique was utilized including caps, mask, sterile gowns, sterile gloves, sterile drape, hand hygiene and skin antiseptic. A small incision was made with #11 blade  scalpel. A 21 gauge needle directed into the right internal jugular vein with ultrasound guidance. A micropuncture dilator set was placed. A single lumen Powerline catheter was selected. The skin below the right clavicle was anesthetized and a small incision was made with an #11 blade scalpel. A subcutaneous tunnel was formed to the vein dermatotomy site. The catheter was brought through the tunnel. The vein dermatotomy site was dilated to accommodate a peel-away sheath. The catheter was placed through the peel-away sheath and directed into the central venous structures. The tip of the catheter was placed at the superior cavoatrial junction with fluoroscopy. Fluoroscopic images were obtained for documentation. Lumen was found to aspirate and flush well. Catheter was flushed with heparinized saline. The vein dermatotomy site was closed using Dermabond. The catheter was secured to the skin using Prolene suture. FINDINGS: Catheter tip at the superior cavoatrial junction. COMPLICATIONS: None IMPRESSION: Successful placement of a right jugular tunneled central venous catheter using ultrasound and fluoroscopic guidance. Electronically Signed   By: Markus Daft M.D.   On: 03/17/2017 12:09   Ir US Guide Vasc Access Right  Result Date: 03/17/2017 INDICATION: 62 year old with acute on chronic heart failure. Patient needs long-term central venous access for milrinone infusion. EXAM: FLUOROSCOPIC AND ULTRASOUND GUIDED PLACEMENT OF A TUNNELED CENTRAL VENOUS CATHETER Physician: Stephan Minister. [place], MD FLUOROSCOPY TIME:  36 seconds, 1 mGy MEDICATIONS: None ANESTHESIA/SEDATION: None PROCEDURE: Informed consent was obtained for placement of a tunneled central venous catheter. The patient was placed supine on the interventional table. Ultrasound confirmed a patent right internal jugularvein. Ultrasound images were obtained for documentation. The right side of the neck was prepped and draped in a sterile fashion. The right side of the neck  was anesthetized with 1% lidocaine. Maximal barrier sterile technique was utilized including caps, mask, sterile gowns, sterile gloves, sterile drape, hand hygiene and skin antiseptic. A small incision was made with #11 blade scalpel. A 21 gauge needle directed into the right internal jugular vein with ultrasound guidance. A micropuncture dilator set was placed. A single lumen Powerline catheter was selected. The skin below the right clavicle was anesthetized and a small incision was made with an #11 blade scalpel. A subcutaneous tunnel was formed to the vein dermatotomy site. The catheter was brought through the tunnel. The vein dermatotomy site was dilated to accommodate a peel-away sheath. The catheter was placed through the peel-away sheath and directed into the central venous structures. The tip of the catheter was placed at the superior cavoatrial junction with fluoroscopy. Fluoroscopic images were obtained for documentation. Lumen was found to aspirate and flush well. Catheter was flushed with heparinized saline. The vein dermatotomy site was closed using Dermabond. The catheter was secured to the skin using Prolene suture. FINDINGS: Catheter tip at the superior cavoatrial junction. COMPLICATIONS: None IMPRESSION: Successful placement of a right jugular tunneled central venous catheter using ultrasound and fluoroscopic guidance. Electronically Signed   By: Scherrie Gerlach.D.  On: 03/17/2017 12:09     Medications:     Scheduled Medications: . acidophilus  1 capsule Oral Daily  . aspirin  81 mg Oral Daily  . atorvastatin  80 mg Oral q1800  . buPROPion  150 mg Oral Daily  . digoxin  0.0625 mg Oral Daily  . heparin  5,000 Units Subcutaneous Q8H  . isosorbide mononitrate  30 mg Oral Daily  . losartan  12.5 mg Oral QHS  . sodium chloride flush  3 mL Intravenous Q12H    Infusions: . sodium chloride    . milrinone 0.125 mcg/kg/min (03/18/17 1034)    PRN Medications: sodium chloride,  acetaminophen, ALPRAZolam, diphenhydrAMINE, furosemide, heparin lock flush, lidocaine (PF), nitroGLYCERIN, ondansetron (ZOFRAN) IV, potassium chloride, sodium chloride flush, sodium chloride flush, zolpidem   Assessment:   Robin Rose is a 62 y.o. female with h/o tobacco abuse, breast cancer (s/p chemo and left mastectomy) and recently diagnosed systolic HF.   Admitted with NSTEMI and A/C systolic CHF.    Plan/Discussion:    1. NSTEMI with known CAD - Troponin peak at 8.20 - Cath 03/16/17 with stable severe 3v CAD not amenable to PCI or CABG. No culprit lesion  - No further CP.  Continue ASA and statin. No b-blocker due to low output  2. 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 ECHO EF 20-25%.  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 Poor candidate for CABG or PCI due to diffuse CAD, previous chest radiation, malnutrition and pectus deformity - CPX 01/20/17 with moderate to severe HF limitation with very high VEVCO2 slope. Spirometry not too bad.  - Repeat RHC/LHC this admit with low output. Have started milrinone and placed tunneled PICC for trial of home milrinone. -  Co-ox improved to 70% but doesn't feel any better and having profound tachycardia with any ambulation. Will stop milrinone. Pull tunneled PICC (discussed personally with IR) - CVP 1-2 range. Not eating well. Will give 250cc NS. She is not on a diuretic - Will stop milrinone and send home today and continue f/u in HF Clinic.  - VAD team has seen and we discussed. Not transplant candidate with severe PAD and recent breast CA. May not be candidate for VAD with small stature and previous chest XRT. Dr. Prescott Gum has seen. I will revisit with him  -  HVAD may be an option but her size may preclude even the smallest device.  3. 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 S/p Adjuvant chemotherapy followed by left mastectomy and LN  dissection 2/18 and XRT. Now complete. No change.  4. Tobacco use - Quit smokingearlier this year. No change.  5. Pulmonary Nodules - Has seen Dr. Vaughan Browner. CT with scattered nodules. Thought to be non specific inflammation.-Planfor CT f/uin a year. 6. Severe Protein calorie malnutrition - Nutrition has seen. Supplements ordered.     Length of Stay: 3   [name] MD 03/19/2017, 8:44 AM  Advanced Heart Failure Team Pager [phone] (M-F; [place])  Please contact [facility] for night-coverage after hours (4p -7a ) and weekends on [email or web address]

## 2017-03-19 NOTE — Progress Notes (Signed)
Pt discharging home with family.  All instructions given and reviewed, all questions answered.   °

## 2017-03-19 NOTE — Discharge Summary (Signed)
Discharge Summary    Patient ID: Robin Rose,  MRN: [id number], DOB/AGE: 1955/10/03 62 y.o.  Admit date: 03/14/2017 Discharge date: 03/19/2017   Primary Care Provider: [name] Primary Cardiologist: Dr. Jeffie Pollock  Discharge Diagnoses    Principal Problem:   NSTEMI (non-ST elevated myocardial infarction) [facility]) Active Problems:   Tobacco abuse   Peripheral arterial disease (HCC)   Ischemic cardiomyopathy   Chest pain with high risk for cardiac etiology   Acute on chronic systolic (congestive) heart failure (HCC)   Protein-calorie malnutrition, severe   Allergies Allergies  Allergen Reactions  . Chantix [Varenicline] Nausea And Vomiting  . Codeine Other (See Comments)    GI Upset, headaches       History of Present Illness     [name] is seen today for evaluation of A/C systolic CHF at the request of Dr. Johnsie Cancel.   Robin Rose is a 62 y.o. female with h/o tobacco abuse, breast cancer (s/p chemo and left mastectomy) and recently diagnosed systolic HF.   Seen in CHF clinic 02/07/17 with NYHA IIIb symptoms.  Sent for [employer] 02/13/17 which showed low cardiac output.  Had planned for Milrinone trial, but per insurance had to be admitted for > 23 hrs prior to starting. Decided to continue medical therapy at that time.   Pt presented to [facility] 03/14[facility]16 with substernal chest pain and nausea.  Relieved with zofran and NTG. Pertinent labs on admission include K 4.0, Creatinine 0.64, BNP 363, WBC 8.7, and Hgb 14.7. Troponin trending up. Peaked at 8.20. Most recent 6.89.    EKG showed inferolateral ST depression and PVCs.  Pt has known 3VD turned down for CABG with small diffusely diseased vessels with total mid RCA and collaterals.  Pt currently feeling better. Denies any further CP since admit. (NTG patch placed on RUE).  Nausea and SOB have improved as well.  Was SOB at home with mild exertion and her ADLs. Denies orthopnea.  + lightheadedness with rapid  standing.  Denies sick contact, fever, or chills.  She states the CP yesterday was a 10/10 pressure, that slowly improved after NTG given. This was the first time she has had chest pain. Willing to undergo trial of milrinone and use at home if needed.   [place] 02/13/17  RA = 1 RV = 26/1 PA = 25/7 (16) PCW = 4 Fick cardiac output/index = 2.7/1.9 PVR = 4.4 WU Ao sat = 95% PA sat = 63%, 64%  Echo 10/2016 LVEF 20-25%  LHC 11/07/16   - Not amenable to PCI. Turned down for CABG.  Prox RCA lesion, 90 %stenosed.  Post Atrio lesion, 100 %stenosed.  Mid RCA lesion, 30 %stenosed.  Ost Cx to Prox Cx lesion, 80 %stenosed.  1st Mrg lesion, 100 %stenosed.  Dist Cx lesion, 90 %stenosed.  Dist LAD lesion, 80 %stenosed.  Mid LAD-2 lesion, 70 %stenosed.  Mid LAD-1 lesion, 40 %stenosed.  Ost 2nd Diag to 2nd Diag lesion, 99 %stenosed.   Hospital Course     Consultants: none  NSTEMI Pt was admitted and underwent right and left heart catheterization on 03/16/17, which revealed severe 3 vessel CAD unchanged from previous with no obvious culprit lesion for NSTEMI. Anatomy is not favorable for PCI and she has already been turned down for surgery. Will continue with medical management. Troponin peaked at 8.20. Moderate depressed CO on right heart cath. On 03/18/17, she had tachycardia with any ambulation on milrinone, so milrinone was reduced to  0.125. She remains tachycardic today and does not feel like milrinone is helping, so it was D/Ce'ed and PICC was removed.   Continued ASA and statin. Beta blocker not started due to low cardiac output.   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 VAD team has seen patient. She is not a candidate for transplant with severe PAD and recent breast cancer.   She was discharged on her home medications with the following exceptions: coreg was discontinued. It  is unclear why ASA and statin were not prescribed in the past. Per the patient, she has not had an adverse reaction to either one of these. I started both at discharge. Imdur started this admission was not continued, per Dr. Haroldine Laws.  She is currently chest pain free and ready for discharge.  Patient seen and examined by Dr. Haroldine Laws today and was stable for discharge. All follow up has been arranged.  _____________  Discharge Vitals Blood pressure 114/65, pulse 64, temperature 98.2 F (36.8 C), temperature source Oral, resp. rate 18, height 5\' 3"  (1.6 m), weight 92 lb 13 oz (42.1 kg), SpO2 94 %.  Filed Weights   03/17/17 0447 03/18/17 0450 03/19/17 0449  Weight: 93 lb 8 oz (42.4 kg) 93 lb 11.2 oz (42.5 kg) 92 lb 13 oz (42.1 kg)    Labs & Radiologic Studies    CBC No results for input(s): WBC, NEUTROABS, HGB, HCT, MCV, PLT in the last 72 hours. Basic Metabolic Panel Recent Labs    03/18/17 0305 03/19/17 0412  NA 132* 130*  K 3.5 3.1*  CL 96* 97*  CO2 27 26  GLUCOSE 126* 146*  BUN 10 7  CREATININE 0.56 0.52  CALCIUM 9.0 9.0   Liver Function Tests No results for input(s): AST, ALT, ALKPHOS, BILITOT, PROT, ALBUMIN in the last 72 hours. No results for input(s): LIPASE, AMYLASE in the last 72 hours. Cardiac Enzymes No results for input(s): CKTOTAL, CKMB, CKMBINDEX, TROPONINI in the last 72 hours. BNP Invalid input(s): POCBNP D-Dimer No results for input(s): DDIMER in the last 72 hours. Hemoglobin A1C No results for input(s): HGBA1C in the last 72 hours. Fasting Lipid Panel No results for input(s): CHOL, HDL, LDLCALC, TRIG, CHOLHDL, LDLDIRECT in the last 72 hours. Thyroid Function Tests No results for input(s): TSH, T4TOTAL, T3FREE, THYROIDAB in the last 72 hours.  Invalid input(s): FREET3 _____________  Dg Chest 2 View  Result Date: 03/14/2017 CLINICAL DATA:  Chest pain x2 hours ago and last evening. History of breast cancer, chemotherapy and radiation. Former  smoker. EXAM: CHEST  2 VIEW COMPARISON:  None. FINDINGS: Pectus excavatum likely contributing its to the hazy appearance of right middle lobe distribution. Pneumonia is not entirely excluded but believed less likely given the pectus deformity. There is cardiomegaly with minimal aortic atherosclerosis. No overt pulmonary edema, effusion or pneumothorax. Left axillary lymph node dissection clips are noted. No acute nor suspicious osseous abnormalities. IMPRESSION: 1. No active cardiopulmonary disease. 2. Pectus excavatum likely contributes to the hazy pulmonary opacity adjacent to the right heart border. Pneumonia although not entirely excluded is believed less likely as a result. 3. Aortic atherosclerosis. Electronically Signed   By: Ashley Royalty M.D.   On: 03/14/2017 17:29   Ir Fluoro Guide Cv Line Right  Result Date: 03/17/2017 INDICATION: 62 year old with acute on chronic heart failure. Patient needs long-term central venous access for milrinone infusion. EXAM: FLUOROSCOPIC AND ULTRASOUND GUIDED PLACEMENT OF A  TUNNELED CENTRAL VENOUS CATHETER Physician: Stephan Minister. Henn, MD FLUOROSCOPY TIME:  36 seconds, 1 mGy MEDICATIONS: None ANESTHESIA/SEDATION: None PROCEDURE: Informed consent was obtained for placement of a tunneled central venous catheter. The patient was placed supine on the interventional table. Ultrasound confirmed a patent right internal jugularvein. Ultrasound images were obtained for documentation. The right side of the neck was prepped and draped in a sterile fashion. The right side of the neck was anesthetized with 1% lidocaine. Maximal barrier sterile technique was utilized including caps, mask, sterile gowns, sterile gloves, sterile drape, hand hygiene and skin antiseptic. A small incision was made with #11 blade scalpel. A 21 gauge needle directed into the right internal jugular vein with ultrasound guidance. A micropuncture dilator set was placed. A single lumen Powerline catheter was selected. The  skin below the right clavicle was anesthetized and a small incision was made with an #11 blade scalpel. A subcutaneous tunnel was formed to the vein dermatotomy site. The catheter was brought through the tunnel. The vein dermatotomy site was dilated to accommodate a peel-away sheath. The catheter was placed through the peel-away sheath and directed into the central venous structures. The tip of the catheter was placed at the superior cavoatrial junction with fluoroscopy. Fluoroscopic images were obtained for documentation. Lumen was found to aspirate and flush well. Catheter was flushed with heparinized saline. The vein dermatotomy site was closed using Dermabond. The catheter was secured to the skin using Prolene suture. FINDINGS: Catheter tip at the superior cavoatrial junction. COMPLICATIONS: None IMPRESSION: Successful placement of a right jugular tunneled central venous catheter using ultrasound and fluoroscopic guidance. Electronically Signed   By: Markus Daft M.D.   On: 03/17/2017 12:09   Ir US Guide Vasc Access Right  Result Date: 03/17/2017 INDICATION: 62 year old with acute on chronic heart failure. Patient needs long-term central venous access for milrinone infusion. EXAM: FLUOROSCOPIC AND ULTRASOUND GUIDED PLACEMENT OF A TUNNELED CENTRAL VENOUS CATHETER Physician: Stephan Minister. Henn, MD FLUOROSCOPY TIME:  36 seconds, 1 mGy MEDICATIONS: None ANESTHESIA/SEDATION: None PROCEDURE: Informed consent was obtained for placement of a tunneled central venous catheter. The patient was placed supine on the interventional table. Ultrasound confirmed a patent right internal jugularvein. Ultrasound images were obtained for documentation. The right side of the neck was prepped and draped in a sterile fashion. The right side of the neck was anesthetized with 1% lidocaine. Maximal barrier sterile technique was utilized including caps, mask, sterile gowns, sterile gloves, sterile drape, hand hygiene and skin antiseptic. A  small incision was made with #11 blade scalpel. A 21 gauge needle directed into the right internal jugular vein with ultrasound guidance. A micropuncture dilator set was placed. A single lumen Powerline catheter was selected. The skin below the right clavicle was anesthetized and a small incision was made with an #11 blade scalpel. A subcutaneous tunnel was formed to the vein dermatotomy site. The catheter was brought through the tunnel. The vein dermatotomy site was dilated to accommodate a peel-away sheath. The catheter was placed through the peel-away sheath and directed into the central venous structures. The tip of the catheter was placed at the superior cavoatrial junction with fluoroscopy. Fluoroscopic images were obtained for documentation. Lumen was found to aspirate and flush well. Catheter was flushed with heparinized saline. The vein dermatotomy site was closed using Dermabond. The catheter was secured to the skin using Prolene suture. FINDINGS: Catheter tip at the superior cavoatrial junction. COMPLICATIONS: None IMPRESSION: Successful placement of a right jugular tunneled central venous catheter  using ultrasound and fluoroscopic guidance. Electronically Signed   By: Markus Daft M.D.   On: 03/17/2017 12:09     Diagnostic Studies/Procedures    Right and left heart cath 03/16/17   Mid LAD-1 lesion is 40% stenosed.  Mid LAD-2 lesion is 95% stenosed.  Ost 2nd Diag to 2nd Diag lesion is 50% stenosed.  Ost Cx to Prox Cx lesion is 60% stenosed.  1st Mrg lesion is 100% stenosed.  Prox RCA lesion is 90% stenosed.  Mid RCA lesion is 30% stenosed.  Post Atrio lesion is 100% stenosed.  Dist RCA lesion is 100% stenosed.  Mid Cx lesion is 95% stenosed.  Dist Cx lesion is 60% stenosed.   Findings:  Ao = 142/80 (105) LV = 158/25 RA = 1 RV = 29/2 PA = 30/19 (20) PCW = 20 Fick cardiac output/index = 2.4/1.7 PVR = 5.0 WU SVR = 3452  FA sat = 97% PA sat = 57%, 58%  Assessment: 1.  Severe 3v CAD unchanged from previous. No obvious culprit lesion for NSTEMI 2. Severe ischemic CM with EF 15% and low output physiology  Plan/Discussion:  Coronary angiograms reviewed with interventional team. No targets for PCI. Has already been turned down for surgery. Will start milrinone. Will review with VAD team to see if she is possible candidate for high-risk HW implantation.    Disposition   Pt is being discharged home today in good condition.  Follow-up Plans & Appointments    Follow-up Information    [facility] Follow up.   Specialty:  [place] Why:  [place] arranged- for HF management and home IV milrinone  Contact information: [place] [address]. Go on 03/28/2017.   Specialty:  Cardiology Why:  10:30 AM, [facility], parking code [id number] Contact information: [address] [id number] [place] [phone]         Discharge Instructions    Diet - low sodium heart healthy   Complete by:  As directed    Discharge instructions   Complete by:  As directed    No driving for 2 days. No lifting over 5 lbs for 1 week. No sexual activity for 1 week. You may return to work in 1 week. Keep procedure site clean & dry. If you notice increased pain, swelling, bleeding or pus, call/return!  You may shower, but no soaking baths/hot tubs/pools for 1 week.   Increase activity slowly   Complete by:  As directed       Discharge Medications   Allergies as of 03/19/2017      Reactions   Chantix [varenicline] Nausea And Vomiting   Codeine Other (See Comments)   GI Upset, headaches       Medication List    STOP taking these medications   carvedilol 3.125 MG tablet Commonly known as:  COREG     TAKE these medications   acetaminophen 500 MG tablet Commonly known as:  TYLENOL Take 1,000 mg  by mouth daily as needed for moderate pain or headache.   aspirin 81 MG chewable tablet Chew 1 tablet (81 mg total) by mouth daily. Start taking on:  03/20/2017   atorvastatin 80 MG tablet Commonly known as:  LIPITOR Take 1 tablet (80 mg total) by mouth daily at 6 PM.   buPROPion 100 MG tablet Commonly known as:  WELLBUTRIN Take  1 & 1/2 tablets by mouth once daily for 3 days. Then increase to 1 & 1/2 tablets twice daily. What changed:    how much to take  how to take this  when to take this  additional instructions   digoxin 0.125 MG tablet Commonly known as:  LANOXIN Take 0.5 tablets (0.0625 mg total) by mouth daily.   diphenhydrAMINE 25 mg capsule Commonly known as:  BENADRYL Take 25 mg by mouth every 6 (six) hours as needed for allergies.   furosemide 20 MG tablet Commonly known as:  LASIX Take 20 mg by mouth 2 (two) times daily as needed for fluid or edema.   losartan 25 MG tablet Commonly known as:  COZAAR Take 0.5 tablets (12.5 mg total) by mouth at bedtime.   MISC NATURAL PRODUCTS PO Take 1 tablet by mouth daily.   ondansetron 4 MG tablet Commonly known as:  ZOFRAN Take 4 mg by mouth every 4 (four) hours as needed for nausea/vomiting.   oxymetazoline 0.05 % nasal spray Commonly known as:  AFRIN Place 1 spray into both nostrils at bedtime as needed for congestion.   potassium chloride 10 MEQ tablet Commonly known as:  K-DUR,KLOR-CON Take 40 mEq by mouth daily as needed (swelling). With first dose of furosemide   PROBIOTIC ADVANCED PO Take 1 capsule by mouth daily.            Durable Medical Equipment  (From admission, onward)        Start     Ordered   03/17/17 1209  Heart failure home health orders  (Heart failure home health orders / Face to face)  Once    Comments:  Heart Failure Follow-up Care:  Verify follow-up appointments per Patient Discharge Instructions. Confirm transportation arranged. Reconcile home medications with discharge  medication list. Remove discontinued medications from use. Assist patient/caregiver to manage medications using pill box. Reinforce low sodium food selection Assessments: Vital signs and oxygen saturation at each visit. Assess home environment for safety concerns, caregiver support and availability of low-sodium foods. Consult Education officer, museum, PT/OT, Dietitian, and CNA based on assessments. Perform comprehensive cardiopulmonary assessment. Notify MD for any change in condition or weight gain of 3 pounds in one day or 5 pounds in one week with symptoms. Daily Weights and Symptom Monitoring: Ensure patient has access to scales. Teach patient/caregiver to weigh daily before breakfast and after voiding using same scale and record.    Teach patient/caregiver to track weight and symptoms and when to notify Provider. Activity: Develop individualized activity plan with patient/caregiver.   [facility] to provide  Labs every other week to include BMET, Mg, and CBC with Diff. Additional as needed.   E3154 Milrinone 0.25 mcg/kg/min X 52 weeks A4221 Supplies for maintenance of drug infusion catheter A4222 Supplies for the external drug infusion per cassette or bag E0781 Ambulatory Infusion pump  Question Answer Comment  Heart Failure Follow-up Care [facility] at [phone]   Obtain the following labs Basic Metabolic Panel   Obtain the following labs Other see comments   Lab frequency Other see comments   Fax lab results to AHF Clinic at [phone]   Diet Low Sodium Heart Healthy   Fluid restrictions: 2000 mL Fluid   Initiate Heart Failure Clinic Diuretic Protocol to be used by [place] only ( to be ordered by Heart Failure Team Providers Only) Yes      03/17/17 1210       Aspirin prescribed at discharge?  Yes High Intensity Statin Prescribed? (Lipitor 40-80mg  or Crestor 20-40mg ): Yes Beta Blocker Prescribed? No: low CO For EF <40%, was ACEI/ARB  Prescribed? Yes ADP Receptor Inhibitor Prescribed? (i.e. Plavix etc.-Includes Medically Managed Patients): No: no PCI For EF <40%, Aldosterone Inhibitor Prescribed? No: hypotension Was EF assessed during THIS hospitalization? Yes Was Cardiac Rehab II ordered? (Included Medically managed Patients): Yes   Outstanding Labs/Studies   Follow up in CHF clinic as scheduled  Duration of Discharge Encounter   Greater than 30 minutes including physician time.  Signed, Tami Lin Duke PA-C 03/19/2017, 10:48 AM   Agree with above. See my rounding note from earlier today for other details.   [name], MD  12:30 PM

## 2017-03-19 NOTE — Care Management (Signed)
Pt has D/C order.  Robin Rose with [facility] made aware of d/c and that Milrinone canceled.  HH HF orders remain.

## 2017-03-19 NOTE — Procedures (Signed)
R IJ tunneled CVC removed with no issues Hemostasis achieved Catheter removed in its entirety. Dry dressing placed.  Robin Rose 10:02 AM 03/19/2017

## 2017-03-21 ENCOUNTER — Telehealth (HOSPITAL_COMMUNITY): Payer: Self-pay | Admitting: Cardiology

## 2017-03-21 NOTE — Telephone Encounter (Addendum)
Patients sister called to reports low b/p readings Reports b/p today 120/65 HR 118 Weight stable at 91 lbs today Patient does have some dizziness  Reports waited ~20 mins rechecked b/p sitting 116/88 standing 85/58 HR 57  Patient takes all her meds at night   Above reviewed with Rebecca Eaton Per VO HOLD Losartan and Imdur, liberalize po fluids today. Call office first thing 03/22/17, if not feeling b/p maybe added for nurse visit (IV fluids and labs)  Sister aware and voiced understanding.

## 2017-03-23 DIAGNOSIS — Z9012 Acquired absence of left breast and nipple: Secondary | ICD-10-CM | POA: Diagnosis not present

## 2017-03-23 DIAGNOSIS — Z923 Personal history of irradiation: Secondary | ICD-10-CM | POA: Diagnosis not present

## 2017-03-23 DIAGNOSIS — Z171 Estrogen receptor negative status [ER-]: Secondary | ICD-10-CM | POA: Diagnosis not present

## 2017-03-23 DIAGNOSIS — Z9221 Personal history of antineoplastic chemotherapy: Secondary | ICD-10-CM | POA: Diagnosis not present

## 2017-03-23 DIAGNOSIS — C50912 Malignant neoplasm of unspecified site of left female breast: Secondary | ICD-10-CM | POA: Diagnosis not present

## 2017-03-23 DIAGNOSIS — I509 Heart failure, unspecified: Secondary | ICD-10-CM | POA: Diagnosis not present

## 2017-03-23 DIAGNOSIS — M79602 Pain in left arm: Secondary | ICD-10-CM | POA: Diagnosis not present

## 2017-03-23 DIAGNOSIS — I2510 Atherosclerotic heart disease of native coronary artery without angina pectoris: Secondary | ICD-10-CM | POA: Diagnosis not present

## 2017-03-23 DIAGNOSIS — I219 Acute myocardial infarction, unspecified: Secondary | ICD-10-CM | POA: Diagnosis not present

## 2017-03-24 ENCOUNTER — Encounter: Payer: Self-pay | Admitting: Cardiovascular Disease

## 2017-03-24 ENCOUNTER — Ambulatory Visit (INDEPENDENT_AMBULATORY_CARE_PROVIDER_SITE_OTHER): Payer: BLUE CROSS/BLUE SHIELD | Admitting: Cardiovascular Disease

## 2017-03-24 VITALS — BP 125/77 | HR 114 | Ht 63.0 in | Wt 93.8 lb

## 2017-03-24 DIAGNOSIS — E785 Hyperlipidemia, unspecified: Secondary | ICD-10-CM | POA: Diagnosis not present

## 2017-03-24 NOTE — Patient Instructions (Signed)
Medication Instructions: Your physician recommends that you continue on your current medications as directed. Please refer to the Current Medication list given to you today.  Labwork: Your physician recommends that you return for a FASTING lipid profile and hepatic function panel in 2 months: March 2019.   Follow-Up: Your physician wants you to follow-up in: 1 year with Dr. Gwenlyn Found. You will receive a reminder letter in the mail two months in advance. If you don't receive a letter, please call our office to schedule the follow-up appointment.  If you need a refill on your cardiac medications before your next appointment, please call your pharmacy.

## 2017-03-24 NOTE — Assessment & Plan Note (Signed)
Recently discontinued tobacco abuse 2 months ago.

## 2017-03-24 NOTE — Assessment & Plan Note (Signed)
History of peripheral arterial disease with occluded right common iliac artery reconstitutes in the external iliac through collaterals. She is completely asymptomatic.

## 2017-03-24 NOTE — Progress Notes (Signed)
03/24/2017 Robin Rose   1955-05-26  [id number]  Primary Physician Garlon Hatchet, Jaymes Graff, MD Primary Cardiologist: Lorretta Harp MD Garret Reddish, [place], [place]  HPI:  Robin Rose is a 62 y.o.  thin-appearing divorced Caucasian female mother of 2, mother of 2 grandchildren who was referred for diagnosis and treatment of probable nonischemic cardiomyopathy. I last saw her in the office 11/22/16. Marland Kitchen She has no cardiac risk factors although her parents to have coronary artery disease. She was diagnosed with triple-negative breast cancer a year ago and had left mastectomy. Her EF 11/05/15 the city of 55% and by [facility] 04/06/16 was 69%. She is admitted to the hospital 2 weeks ago with orthopnea and heart failure. She was diuresed 8 pounds. Her EF was 20-25% on 08/03/16. She feels clinically improved today. She does weigh herself on a daily basis. I referred her to Dr. Haroldine Laws  who performed a right and left heart cath on her 11/07/16. She had severe three-vessel disease with low filling pressures. In addition, she did have an incidentally noted occluded right common iliac artery although she is asymptomatic from this. Since I saw her several months ago she was admitted on New Year's Day with chest pain. She ruled in for a "non-STEMI" and underwent right and left heart catheterization by Dr. Haroldine Laws  revealing again three-vessel disease which was diffuse and non-revascularizable and severe LV dysfunction with low cardiac output. She's been considered for an LVAD.      Current Meds  Medication Sig  . acetaminophen (TYLENOL) 500 MG tablet Take 1,000 mg by mouth daily as needed for moderate pain or headache.   Marland Kitchen aspirin 81 MG chewable tablet Chew 1 tablet (81 mg total) by mouth daily.  Marland Kitchen atorvastatin (LIPITOR) 80 MG tablet Take 1 tablet (80 mg total) by mouth daily at 6 PM.  . buPROPion (WELLBUTRIN) 100 MG tablet Take 1 & 1/2 tablets by mouth once daily for 3 days. Then increase to 1 & 1/2 tablets  twice daily. (Patient taking differently: Take 150 mg by mouth daily. )  . digoxin (LANOXIN) 0.125 MG tablet Take 0.5 tablets (0.0625 mg total) by mouth daily.  . diphenhydrAMINE (BENADRYL) 25 mg capsule Take 25 mg by mouth every 6 (six) hours as needed for allergies.  . furosemide (LASIX) 20 MG tablet Take 20 mg by mouth 2 (two) times daily as needed for fluid or edema.   Marland Kitchen losartan (COZAAR) 25 MG tablet Take 0.5 tablets (12.5 mg total) by mouth at bedtime.  Marland Kitchen MISC NATURAL PRODUCTS PO Take 1 tablet by mouth daily.  . ondansetron (ZOFRAN) 4 MG tablet Take 4 mg by mouth every 4 (four) hours as needed for nausea/vomiting.  Marland Kitchen oxymetazoline (AFRIN) 0.05 % nasal spray Place 1 spray into both nostrils at bedtime as needed for congestion.  . potassium chloride (K-DUR,KLOR-CON) 10 MEQ tablet Take 40 mEq by mouth daily as needed (swelling). With first dose of furosemide  . Probiotic Product (PROBIOTIC ADVANCED PO) Take 1 capsule by mouth daily.      Allergies  Allergen Reactions  . Chantix [Varenicline] Nausea And Vomiting  . Codeine Other (See Comments)    GI Upset, headaches      Social History   Socioeconomic History  . Marital status: Divorced    Spouse name: Not on file  . Number of children: Not on file  . Years of education: Not on file  . Highest education level: Not on file  Social Needs  .  Financial resource strain: Not on file  . Food insecurity - worry: Not on file  . Food insecurity - inability: Not on file  . Transportation needs - medical: Not on file  . Transportation needs - non-medical: Not on file  Occupational History  . Not on file  Tobacco Use  . Smoking status: Former Smoker    Packs/day: 1.00    Years: 38.00    Pack years: 38.00    Types: Cigarettes    Last attempt to quit: 11/23/2016    Years since quitting: 0.3  . Smokeless tobacco: Never Used  Substance and Sexual Activity  . Alcohol use: No    Frequency: Never  . Drug use: No  . Sexual activity: Not  Currently  Other Topics Concern  . Not on file  Social History Narrative  . Not on file     Review of Systems: General: negative for chills, fever, night sweats or weight changes.  Cardiovascular: negative for chest pain, dyspnea on exertion, edema, orthopnea, palpitations, paroxysmal nocturnal dyspnea or shortness of breath Dermatological: negative for rash Respiratory: negative for cough or wheezing Urologic: negative for hematuria Abdominal: negative for nausea, vomiting, diarrhea, bright red blood per rectum, melena, or hematemesis Neurologic: negative for visual changes, syncope, or dizziness All other systems reviewed and are otherwise negative except as noted above.    Blood pressure 125/77, pulse (!) 114, height 5\' 3"  (1.6 m), weight 93 lb 12.8 oz (42.5 kg), SpO2 99 %.  General appearance: alert and no distress Neck: no adenopathy, no carotid bruit, no JVD, supple, symmetrical, trachea midline and thyroid not enlarged, symmetric, no tenderness/mass/nodules Lungs: clear to auscultation bilaterally Heart: regular rate and rhythm, S1, S2 normal, no murmur, click, rub or gallop Extremities: extremities normal, atraumatic, no cyanosis or edema Pulses: Diminished right pedal pulse Skin: Skin color, texture, turgor normal. No rashes or lesions Neurologic: Alert and oriented X 3, normal strength and tone. Normal symmetric reflexes. Normal coordination and gait  EKG not performed today  ASSESSMENT AND PLAN:   Tobacco abuse Recently discontinued tobacco abuse 2 months ago.  Peripheral arterial disease (HCC) History of peripheral arterial disease with occluded right common iliac artery reconstitutes in the external iliac through collaterals. She is completely asymptomatic.  Ischemic cardiomyopathy History of ischemic cardiomyopathy with an EF of 15%. She has 3 vessel disease which is not revascularizable and was recently admitted on 03/14/17 with chest pain and non-STEMI. This led  to treat her medically. She is being evaluated for an LVAD by Dr. Haroldine Laws.       Lorretta Harp MD FACP,FACC,FAHA, [facility] 03/24/2017 9:33 AM

## 2017-03-24 NOTE — Assessment & Plan Note (Signed)
History of ischemic cardiomyopathy with an EF of 15%. She has 3 vessel disease which is not revascularizable and was recently admitted on 03/14/17 with chest pain and non-STEMI. This led to treat her medically. She is being evaluated for an LVAD by Dr. Haroldine Laws.

## 2017-03-27 ENCOUNTER — Telehealth (HOSPITAL_COMMUNITY): Payer: Self-pay

## 2017-03-27 NOTE — Telephone Encounter (Signed)
CHF Clinic appointment reminder call placed to patient for upcoming post-hospital follow up.  Does understand purpose of this appointment and where CHF Clinic is located? Yes  How is patient feeling? "BP low with dizzy spells if I stand up or move too fast" 101/64, 95/59, 100/52, 103/59  Does patient have all of their medications since their recent discharge? Yes  Patient also reminded to take all medications as prescribed on the day of his/her appointment and to bring all medications to this appointment.  Advised to call our office for tardiness or cancellations/rescheduling needs.  Leory Plowman, Guinevere Ferrari

## 2017-03-28 ENCOUNTER — Encounter (HOSPITAL_COMMUNITY): Payer: Self-pay

## 2017-03-28 ENCOUNTER — Ambulatory Visit (HOSPITAL_COMMUNITY)
Admit: 2017-03-28 | Discharge: 2017-03-28 | Disposition: A | Discharge status: Home / Self Care | Payer: BLUE CROSS/BLUE SHIELD | Attending: Cardiology | Admitting: Cardiology

## 2017-03-28 VITALS — BP 84/70 | HR 105 | Wt 92.6 lb

## 2017-03-28 DIAGNOSIS — I428 Other cardiomyopathies: Secondary | ICD-10-CM | POA: Diagnosis not present

## 2017-03-28 DIAGNOSIS — C50912 Malignant neoplasm of unspecified site of left female breast: Secondary | ICD-10-CM

## 2017-03-28 DIAGNOSIS — I5022 Chronic systolic (congestive) heart failure: Secondary | ICD-10-CM | POA: Diagnosis not present

## 2017-03-28 MED ORDER — IVABRADINE HCL 5 MG PO TABS: 2.5000 mg | ORAL_TABLET | Freq: Two times a day (BID) | ORAL | 11 refills | Status: DC

## 2017-03-28 NOTE — Patient Instructions (Signed)
STOP Losartan.  START Corlanor 2.5 mg (1/5 tablet) twice daily.  Follow up 2 weeks with Dr. Haroldine Laws.  Take all medication as prescribed the day of your appointment. Bring all medications with you to your appointment.  Do the following things EVERYDAY: 1) Weigh yourself in the morning before breakfast. Write it down and keep it in a log. 2) Take your medicines as prescribed 3) Eat low salt foods-Limit salt (sodium) to 2000 mg per day.  4) Stay as active as you can everyday 5) Limit all fluids for the day to less than 2 liters

## 2017-03-28 NOTE — Progress Notes (Signed)
ADVANCED HF CLINIC  NOTE  Referring Physician: Gwenlyn Found  Primary Care: Dr. Garlon Hatchet  Primary Cardiologist: Gwenlyn Found   HPI:  Ms. Heick is a 62 year old woman with h/o tobacco abuse, breast cancer (s/p chemo and left mastectomy) and recently diagnosed systolic HF.    In 8/17 she was diagnosed with triple-negative breast cancer  with lymph node involvement. She had a clinically staged T4b, N1, M0 cancer which was stage IIIB. Her tumor was ER negative, PR negative and Her-2/neu negative. Her Ki67 was 30%. She underwent neoadjuvant chemotherapy including adriamycin (6 cycles)  followed by left mastectomy in 2/18 and XRT (completed 4/18).   Her EF 11/05/15 was 55% and by [facility] 04/06/16 was 69%. She was admitted to [facility] 08/02/16  with orthopnea and heart failure. She was diuresed 8 pounds. Her EF was 20-25% by echo on 08/03/16. RV was said to be normal. She saw Dr. Gwenlyn Found on 08/19/16. She was started on low-dose carvedilol. Lisinopril and lasix stopped by Dr. Garlon Hatchet on 08/15/16 due to low BP.   Admitted 1/1 with chest pain and shortness of breath. Had LHC/RHC unchanged 3 vessel disease, Placed on milrinone but later weaned off. Weight was 90 pounds.   Today she returns for post hospital follow up. On 1/8 losartan was held and she has also held losartan 2 other days. Complaining of dizziness. SOB walking and performing ADLs. No fever or chills. WEight at home 92-93 pounds. Appetite ok.  Taking all medications.   CPX 11/18 FVC 2.01 (66%)    FEV1 1.53 (64%)     FEV1/FVC 76 (96%)     MVV 63 (70%)   Resting HR: 88 Peak HR: 126  (79% age predicted max HR)  BP rest: 124/76 BP peak: 158/70 Peak VO2: 16.8 (67% predicted peak VO2) VE/VCO2 slope: 48 OUES: 0.71 Peak RER: 1.07 Ventilatory Threshold: 12.7 (50% predicted and 76% measured peak VO2) VE/MVV: 51% O2pulse: 7  (100% predicted O2pulse)   ECHO 11/2016 EF 20-25% grade I DD.   RHC/LHC 03/16/2017   Mid LAD-1 lesion is 40%  stenosed.  Mid LAD-2 lesion is 95% stenosed.  Ost 2nd Diag to 2nd Diag lesion is 50% stenosed.  Ost Cx to Prox Cx lesion is 60% stenosed.  1st Mrg lesion is 100% stenosed.  Prox RCA lesion is 90% stenosed.  Mid RCA lesion is 30% stenosed.  Post Atrio lesion is 100% stenosed.  Dist RCA lesion is 100% stenosed.  Mid Cx lesion is 95% stenosed.  Dist Cx lesion is 60% stenosed.  Findings: Ao = 142/80 (105) LV = 158/25 RA = 1 RV = 29/2 PA = 30/19 (20) PCW = 20 Fick cardiac output/index = 2.4/1.7 PVR = 5.0 WU SVR = 3452  FA sat = 97% PA sat = 57%, 58% Assessment: 1. Severe 3v CAD unchanged from previous. No obvious culprit lesion for NSTEMI 2. Severe ischemic CM with EF 15% and low output physiology   RHC/LHC 11/07/2016   Prox RCA lesion, 90 %stenosed.  Post Atrio lesion, 100 %stenosed.  Mid RCA lesion, 30 %stenosed.  Ost Cx to Prox Cx lesion, 80 %stenosed.  1st Mrg lesion, 100 %stenosed.  Dist Cx lesion, 90 %stenosed.  Dist LAD lesion, 80 %stenosed.  Mid LAD-2 lesion, 70 %stenosed.  Mid LAD-1 lesion, 40 %stenosed.  Ost 2nd Diag to 2nd Diag lesion, 99 %stenosed.  Ao = 135/72 (97) LV = 135/15 RA = 1 RV = 28/3 PA = 29/8 (17) PCW = 5 Fick cardiac output/index = 2.6/1.8  Thermo CO/CI = 7.0/5.0 PVR = 4.8 WU FA sat = 92% PA sat = 58%, 60%   PMHx:  . Allergic rhinitis 08/15/2016  . Chronic systolic heart failure ([place]) 08/15/2016  . Malignant neoplasm of overlapping sites of left female breast ([place]) 10/19/2015      Current Outpatient Medications  Medication Sig Dispense Refill  . acetaminophen (TYLENOL) 500 MG tablet Take 1,000 mg by mouth daily as needed for moderate pain or headache.     Marland Kitchen aspirin 81 MG chewable tablet Chew 1 tablet (81 mg total) by mouth daily. 90 tablet 4  . atorvastatin (LIPITOR) 80 MG tablet Take 1 tablet (80 mg total) by mouth daily at 6 PM. 90 tablet 4  . buPROPion (WELLBUTRIN) 100 MG tablet Take 1 & 1/2 tablets by mouth  once daily for 3 days. Then increase to 1 & 1/2 tablets twice daily. (Patient taking differently: Take 150 mg by mouth daily. ) 180 tablet 1  . digoxin (LANOXIN) 0.125 MG tablet Take 0.5 tablets (0.0625 mg total) by mouth daily. 45 tablet 3  . diphenhydrAMINE (BENADRYL) 25 mg capsule Take 25 mg by mouth every 6 (six) hours as needed for allergies.    . furosemide (LASIX) 20 MG tablet Take 20 mg by mouth 2 (two) times daily as needed for fluid or edema.     Marland Kitchen losartan (COZAAR) 25 MG tablet Take 0.5 tablets (12.5 mg total) by mouth at bedtime. 45 tablet 3  . MISC NATURAL PRODUCTS PO Take 1 tablet by mouth daily.    . ondansetron (ZOFRAN) 4 MG tablet Take 4 mg by mouth every 4 (four) hours as needed for nausea/vomiting.  2  . oxymetazoline (AFRIN) 0.05 % nasal spray Place 1 spray into both nostrils at bedtime as needed for congestion.    . potassium chloride (K-DUR,KLOR-CON) 10 MEQ tablet Take 40 mEq by mouth daily as needed (swelling). With first dose of furosemide    . Probiotic Product (PROBIOTIC ADVANCED PO) Take 1 capsule by mouth daily.      No current facility-administered medications for this encounter.     Allergies  Allergen Reactions  . Chantix [Varenicline] Nausea And Vomiting  . Codeine Other (See Comments)    GI Upset, headaches        Social History   Socioeconomic History  . Marital status: Divorced    Spouse name: Not on file  . Number of children: Not on file  . Years of education: Not on file  . Highest education level: Not on file  Social Needs  . Financial resource strain: Not on file  . Food insecurity - worry: Not on file  . Food insecurity - inability: Not on file  . Transportation needs - medical: Not on file  . Transportation needs - non-medical: Not on file  Occupational History  . Not on file  Tobacco Use  . Smoking status: Former Smoker    Packs/day: 1.00    Years: 38.00    Pack years: 38.00    Types: Cigarettes    Last attempt to quit: 11/23/2016     Years since quitting: 0.3  . Smokeless tobacco: Never Used  Substance and Sexual Activity  . Alcohol use: No    Frequency: Never  . Drug use: No  . Sexual activity: Not Currently  Other Topics Concern  . Not on file  Social History Narrative  . Not on file    Family History   . Heart disease Mother - + CAD  with CABG at age 44 . Kidney cancer Mother  . Heart disease Father  - CABG and AVR in 75s . Breast cancer Paternal Aunt  . Breast cancer Cousin  . Colon cancer Neg Hx    - 3 sisters - no CAD       Vitals:   03/28/17 1020 03/28/17 1030  BP: 112/72 (!) 84/70  Pulse: (!) 105   SpO2: 99%   Weight: 92 lb 9.6 oz (42 kg)     PHYSICAL EXAM: General:  Thin  appearing. No resp difficulty HEENT: normal Neck: supple. no JVD. Carotids 2+ bilat; no bruits. No lymphadenopathy or thryomegaly appreciated. Cor: PMI nondisplaced. Regular rate & rhythm. No rubs, or murmurs. + S3  Lungs: clear Abdomen: soft, nontender, nondistended. No hepatosplenomegaly. No bruits or masses. Good bowel sounds. Extremities: no cyanosis, clubbing, rash, edema Neuro: alert & orientedx3, cranial nerves grossly intact. moves all 4 extremities w/o difficulty. Affect pleasant   ASSESSMENT & PLAN: 1. Chronic systolic HF - 06/9673 .  ECHO EF 20-25%.  - LHC with severe 3 vessel disease.   Poor candidate for CABG or PCI due to diffuse CAD - No bb with low output.  - Stop losartan with hypotension - Add 2.5 5 mg corlanor twice a day.  -- Get cMRI.  - CPX reveals moderate to severe HF limitation with very high VEVCO2 slope. Spirometry not too bad. We had long talk about her situation. Not transplant candidate with severe PAD and recent breast CA. 2. 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 S/p Adjuvant chemotherapy followed by left mastectomy and LN dissection 2/18 and XRT. Now complete. 3. Tobacco use -Quit smoking earlier this year 4. Pulmonary Nodules- saw Dr Vaughan Browner. CTwith scattered nodules. Thought to  be non specific inflammation. - Plan for CT f/uin a year.  5. CAD- LHC 03/2017  Severe 3v CAD unchanged from previous. No obvious culprit lesion for NSTEMI   Follow up with Dr Haroldine Laws in 2 weeks.    Darrick Grinder, NP  10:34 AM  Patient seen and examined with Darrick Grinder, NP. We discussed all aspects of the encounter. I agree with the assessment and plan as stated above.   Recently admitted for NSTEMI ad worsening HF. Coronary angio without obvious culprit. No targets for revascularization. RHC with persistent low output. Started on milrinone with no benefit and in fact it made her worse with persistent tachycardia. She has been formally presented at [facility] and not felt to be VAD candidate sue to small size, pectus defect and recent chem/XRT. I discussed these issues with her and her sister and offered her a referral to [facility] for a second opinion which she was not interested in at this point. Have referred to [facility] in [place].   On exam thin and frail but NAD Cor RRR Lungs with decreased BS throughout but no wheeze, Ext warm with no edema.   Total time spent 35 minutes. Over half that time spent discussing above.   [name], MD  11:26 PM

## 2017-03-28 NOTE — Progress Notes (Signed)
Disability physician statement form completed and placed in CHF MD clinic documents folder for upcoming clinic patients as patient due to be seen in clinic in 2 weeks. Left section tagged to be completed by pateint, DB to sign during OV.  Renee Pain, RN

## 2017-04-03 ENCOUNTER — Ambulatory Visit: Payer: BLUE CROSS/BLUE SHIELD

## 2017-04-10 ENCOUNTER — Ambulatory Visit (HOSPITAL_COMMUNITY)
Admission: RE | Admit: 2017-04-10 | Discharge: 2017-04-10 | Disposition: A | Discharge status: Home / Self Care | Payer: BLUE CROSS/BLUE SHIELD | Source: Ambulatory Visit | Attending: Internal Medicine | Admitting: Internal Medicine

## 2017-04-10 VITALS — BP 128/74 | HR 82 | Wt 94.0 lb

## 2017-04-10 DIAGNOSIS — Z885 Allergy status to narcotic agent status: Secondary | ICD-10-CM | POA: Diagnosis not present

## 2017-04-10 DIAGNOSIS — Z888 Allergy status to other drugs, medicaments and biological substances status: Secondary | ICD-10-CM | POA: Diagnosis not present

## 2017-04-10 DIAGNOSIS — Z7982 Long term (current) use of aspirin: Secondary | ICD-10-CM | POA: Diagnosis not present

## 2017-04-10 DIAGNOSIS — R55 Syncope and collapse: Secondary | ICD-10-CM | POA: Diagnosis not present

## 2017-04-10 DIAGNOSIS — Z8051 Family history of malignant neoplasm of kidney: Secondary | ICD-10-CM | POA: Diagnosis not present

## 2017-04-10 DIAGNOSIS — R000 Tachycardia, unspecified: Secondary | ICD-10-CM | POA: Insufficient documentation

## 2017-04-10 DIAGNOSIS — I2510 Atherosclerotic heart disease of native coronary artery without angina pectoris: Secondary | ICD-10-CM

## 2017-04-10 DIAGNOSIS — I959 Hypotension, unspecified: Secondary | ICD-10-CM | POA: Diagnosis not present

## 2017-04-10 DIAGNOSIS — Z171 Estrogen receptor negative status [ER-]: Secondary | ICD-10-CM | POA: Insufficient documentation

## 2017-04-10 DIAGNOSIS — R918 Other nonspecific abnormal finding of lung field: Secondary | ICD-10-CM | POA: Diagnosis not present

## 2017-04-10 DIAGNOSIS — I255 Ischemic cardiomyopathy: Secondary | ICD-10-CM | POA: Diagnosis not present

## 2017-04-10 DIAGNOSIS — Z8249 Family history of ischemic heart disease and other diseases of the circulatory system: Secondary | ICD-10-CM | POA: Diagnosis not present

## 2017-04-10 DIAGNOSIS — Z803 Family history of malignant neoplasm of breast: Secondary | ICD-10-CM | POA: Diagnosis not present

## 2017-04-10 DIAGNOSIS — Z79899 Other long term (current) drug therapy: Secondary | ICD-10-CM | POA: Insufficient documentation

## 2017-04-10 DIAGNOSIS — Z9221 Personal history of antineoplastic chemotherapy: Secondary | ICD-10-CM | POA: Diagnosis not present

## 2017-04-10 DIAGNOSIS — Z9012 Acquired absence of left breast and nipple: Secondary | ICD-10-CM | POA: Insufficient documentation

## 2017-04-10 DIAGNOSIS — I5022 Chronic systolic (congestive) heart failure: Secondary | ICD-10-CM | POA: Diagnosis not present

## 2017-04-10 DIAGNOSIS — C50919 Malignant neoplasm of unspecified site of unspecified female breast: Secondary | ICD-10-CM | POA: Diagnosis not present

## 2017-04-10 DIAGNOSIS — Z87891 Personal history of nicotine dependence: Secondary | ICD-10-CM | POA: Diagnosis not present

## 2017-04-10 NOTE — Progress Notes (Signed)
ADVANCED HF CLINIC  NOTE  Referring Physician: Gwenlyn Found  Primary Care: Dr. Garlon Hatchet  Primary Cardiologist: Gwenlyn Found   HPI:  Robin Rose is a 62 year old woman with h/o tobacco abuse, breast cancer (s/p chemo and left mastectomy) and recently diagnosed systolic HF.    In 8/17 she was diagnosed with triple-negative breast cancer  with lymph node involvement. She had a clinically staged T4b, N1, M0 cancer which was stage IIIB. Her tumor was ER negative, PR negative and Her-2/neu negative. Her Ki67 was 30%. She underwent neoadjuvant chemotherapy including adriamycin (6 cycles)  followed by left mastectomy in 2/18 and XRT (completed 4/18).   Her EF 11/05/15 was 55% and by MUGA 04/06/16 was 69%. She was admitted to [facility] 08/02/16  with orthopnea and heart failure. She was diuresed 8 pounds. Her EF was 20-25% by echo on 08/03/16. RV was said to be normal. She saw Dr. Gwenlyn Found on 08/19/16. She was started on low-dose carvedilol. Lisinopril and lasix stopped by Dr. Garlon Hatchet on 08/15/16 due to low BP.   Admitted 1/1 with chest pain and shortness of breath. Had LHC/RHC unchanged 3 vessel disease, Placed on milrinone but later weaned off. Weight was 90 pounds.   Today she returns for hospital follow up. Continues to struggle. Hard to do ADLs due to weakness. On Saturday BP would not register when standing. On the 3rd try, legs felt very weak and started to shake. Became presyncopal. No syncope. HR wasn't racing. No CP at the time. Not taking any lasix.   CPX 11/18 FVC 2.01 (66%)    FEV1 1.53 (64%)     FEV1/FVC 76 (96%)     MVV 63 (70%)   Resting HR: 88 Peak HR: 126  (79% age predicted max HR)  BP rest: 124/76 BP peak: 158/70 Peak VO2: 16.8 (67% predicted peak VO2) VE/VCO2 slope: 48 OUES: 0.71 Peak RER: 1.07 Ventilatory Threshold: 12.7 (50% predicted and 76% measured peak VO2) VE/MVV: 51% O2pulse: 7  (100% predicted O2pulse)   ECHO 11/2016 EF 20-25% grade I DD.   RHC/LHC 03/16/2017    Mid LAD-1 lesion is 40% stenosed.  Mid LAD-2 lesion is 95% stenosed.  Ost 2nd Diag to 2nd Diag lesion is 50% stenosed.  Ost Cx to Prox Cx lesion is 60% stenosed.  1st Mrg lesion is 100% stenosed.  Prox RCA lesion is 90% stenosed.  Mid RCA lesion is 30% stenosed.  Post Atrio lesion is 100% stenosed.  Dist RCA lesion is 100% stenosed.  Mid Cx lesion is 95% stenosed.  Dist Cx lesion is 60% stenosed.  Findings: Ao = 142/80 (105) LV = 158/25 RA = 1 RV = 29/2 PA = 30/19 (20) PCW = 20 Fick cardiac output/index = 2.4/1.7 PVR = 5.0 WU SVR = 3452  FA sat = 97% PA sat = 57%, 58% Assessment: 1. Severe 3v CAD unchanged from previous. No obvious culprit lesion for NSTEMI 2. Severe ischemic CM with EF 15% and low output physiology   RHC/LHC 11/07/2016   Prox RCA lesion, 90 %stenosed.  Post Atrio lesion, 100 %stenosed.  Mid RCA lesion, 30 %stenosed.  Ost Cx to Prox Cx lesion, 80 %stenosed.  1st Mrg lesion, 100 %stenosed.  Dist Cx lesion, 90 %stenosed.  Dist LAD lesion, 80 %stenosed.  Mid LAD-2 lesion, 70 %stenosed.  Mid LAD-1 lesion, 40 %stenosed.  Ost 2nd Diag to 2nd Diag lesion, 99 %stenosed.  Ao = 135/72 (97) LV = 135/15 RA = 1 RV = 28/3 PA = 29/8 (17)  PCW = 5 Fick cardiac output/index = 2.6/1.8 Thermo CO/CI = 7.0/5.0 PVR = 4.8 WU FA sat = 92% PA sat = 58%, 60%   PMHx:  . Allergic rhinitis 08/15/2016  . Chronic systolic heart failure ([place]) 08/15/2016  . Malignant neoplasm of overlapping sites of left female breast ([place]) 10/19/2015      Current Outpatient Medications  Medication Sig Dispense Refill  . acetaminophen (TYLENOL) 500 MG tablet Take 1,000 mg by mouth daily as needed for moderate pain or headache.     Marland Kitchen aspirin 81 MG chewable tablet Chew 1 tablet (81 mg total) by mouth daily. 90 tablet 4  . atorvastatin (LIPITOR) 80 MG tablet Take 1 tablet (80 mg total) by mouth daily at 6 PM. 90 tablet 4  . buPROPion (WELLBUTRIN) 100 MG tablet  Take 1 & 1/2 tablets by mouth once daily for 3 days. Then increase to 1 & 1/2 tablets twice daily. (Patient taking differently: Take 150 mg by mouth daily. ) 180 tablet 1  . digoxin (LANOXIN) 0.125 MG tablet Take 0.5 tablets (0.0625 mg total) by mouth daily. 45 tablet 3  . diphenhydrAMINE (BENADRYL) 25 mg capsule Take 25 mg by mouth every 6 (six) hours as needed for allergies.    . furosemide (LASIX) 20 MG tablet Take 20 mg by mouth 2 (two) times daily as needed for fluid or edema.     . ivabradine (CORLANOR) 5 MG TABS tablet Take 0.5 tablets (2.5 mg total) by mouth 2 (two) times daily with a meal. 30 tablet 11  . MISC NATURAL PRODUCTS PO Take 1 tablet by mouth daily.    . ondansetron (ZOFRAN) 4 MG tablet Take 4 mg by mouth every 4 (four) hours as needed for nausea/vomiting.  2  . oxymetazoline (AFRIN) 0.05 % nasal spray Place 1 spray into both nostrils at bedtime as needed for congestion.    . potassium chloride (K-DUR,KLOR-CON) 10 MEQ tablet Take 40 mEq by mouth daily as needed (swelling). With first dose of furosemide    . Probiotic Product (PROBIOTIC ADVANCED PO) Take 1 capsule by mouth daily.      No current facility-administered medications for this encounter.     Allergies  Allergen Reactions  . Chantix [Varenicline] Nausea And Vomiting  . Codeine Other (See Comments)    GI Upset, headaches        Social History   Socioeconomic History  . Marital status: Divorced    Spouse name: Not on file  . Number of children: Not on file  . Years of education: Not on file  . Highest education level: Not on file  Social Needs  . Financial resource strain: Not on file  . Food insecurity - worry: Not on file  . Food insecurity - inability: Not on file  . Transportation needs - medical: Not on file  . Transportation needs - non-medical: Not on file  Occupational History  . Not on file  Tobacco Use  . Smoking status: Former Smoker    Packs/day: 1.00    Years: 38.00    Pack years:  38.00    Types: Cigarettes    Last attempt to quit: 11/23/2016    Years since quitting: 0.3  . Smokeless tobacco: Never Used  Substance and Sexual Activity  . Alcohol use: No    Frequency: Never  . Drug use: No  . Sexual activity: Not Currently  Other Topics Concern  . Not on file  Social History Narrative  . Not on file  Family History   . Heart disease Mother - + CAD with CABG at age 77 . Kidney cancer Mother  . Heart disease Father  - CABG and AVR in 53s . Breast cancer Paternal Aunt  . Breast cancer Cousin  . Colon cancer Neg Hx    - 3 sisters - no CAD       Vitals:   04/10/17 1444  BP: 128/74  Pulse: 82  SpO2: 100%  Weight: 94 lb (42.6 kg)   Orthostatics done personally Sitting 136/78 Standing 116/78  PHYSICAL EXAM: General:  Thin  appearing. No resp difficulty HEENT: normal Neck: supple. no JVD. Carotids 2+ bilat; no bruits. No lymphadenopathy or thryomegaly appreciated. Cor: PMI nondisplaced. Regular rate & rhythm. No rubs, or murmurs. + S3  Lungs: clear Abdomen: soft, nontender, nondistended. No hepatosplenomegaly. No bruits or masses. Good bowel sounds. Extremities: no cyanosis, clubbing, rash, edema Neuro: alert & orientedx3, cranial nerves grossly intact. moves all 4 extremities w/o difficulty. Affect pleasant   ASSESSMENT & PLAN: 1. Chronic systolic HF - 11/5282 .  ECHO EF 20-25%.  - LHC with severe 3 vessel disease.  - Poor candidate for CABG or PCI due to diffuse CAD - No bb with low output.  - Off losartan due to hypotension  - Off lasix and spiro due to volume depletion -  On 2.5 mg corlanor twice a day. Continue digoxin.  - Get cMRI.  - CPX reveals moderate to severe HF limitation with very high VEVCO2 slope. Spirometry not too bad. We had long talk about her situation. Not transplant candidate with severe PAD and recent breast CA. - Failed milrinone attempt in 1/19 due to worsening tachycardia - She is clearly end-stage with Class D  HF. Not candidate for advanced therapies and has failed inotropes. We offered her an evaluation at [facility] for 2nd opinion but she has declined. We discussed Palliative Care but she feels that she is not ready at this time - Will refer to CR at [facility] to see if we can provide some symptomatic improvement 2. Presyncope - She is orthostatic in Clinic. Events sound orthostatic or possibly vagal - Encouraged salt and fluid intake.  - Place event monitor - Can add midodrine as needed 3. 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 S/p Adjuvant chemotherapy followed by left mastectomy and LN dissection 2/18 and XRT. Now complete. 3. Tobacco use -Quit smoking earlier this year 4. Pulmonary Nodules- saw Dr Vaughan Browner. CTwith scattered nodules. Thought to be non specific inflammation. - Plan for CT f/uin a year.  5. CAD - s/p NSTEM 1/19/ LHC 03/2017  Severe 3v CAD unchanged from previous. No obvious culprit lesion for NSTEMI - continue ASA/statin  Total time spent 45 minutes. Over half that time spent discussing above.    Robin Bickers, MD  2:56 PM

## 2017-04-10 NOTE — Progress Notes (Signed)
Medication Samples have been provided to the patient.  Drug name: Corlanor      Strength: 5 mg   Qty: 2  LOT: [id number]  Exp.Date: 7/21  Dosing instructions: Take 0.5 Tablet Twice Daily  The patient has been instructed regarding the correct time, dose, and frequency of taking this medication, including desired effects and most common side effects.   Robin Rose 3:21 PM 04/10/2017

## 2017-04-10 NOTE — Patient Instructions (Signed)
You have been referred to Cardiac Rehab in [place].  They will contact you to schedule intial appointment.  30-Day event monitor has been ordered for you, they will contact you to schedule setup.  Follow up in 2 months

## 2017-04-12 ENCOUNTER — Ambulatory Visit (INDEPENDENT_AMBULATORY_CARE_PROVIDER_SITE_OTHER): Payer: BLUE CROSS/BLUE SHIELD

## 2017-04-12 ENCOUNTER — Other Ambulatory Visit (HOSPITAL_COMMUNITY): Payer: Self-pay | Admitting: Internal Medicine

## 2017-04-12 DIAGNOSIS — I5022 Chronic systolic (congestive) heart failure: Secondary | ICD-10-CM | POA: Diagnosis not present

## 2017-04-12 DIAGNOSIS — R002 Palpitations: Secondary | ICD-10-CM

## 2017-04-17 ENCOUNTER — Telehealth (HOSPITAL_COMMUNITY): Payer: Self-pay | Admitting: *Deleted

## 2017-04-17 NOTE — Telephone Encounter (Signed)
Order for cardiac rehab and patient records faxed to [place] today to [phone].

## 2017-04-19 ENCOUNTER — Encounter (HOSPITAL_COMMUNITY): Payer: BLUE CROSS/BLUE SHIELD | Admitting: Internal Medicine

## 2017-05-04 ENCOUNTER — Telehealth (HOSPITAL_COMMUNITY): Payer: Self-pay | Admitting: *Deleted

## 2017-05-04 NOTE — Telephone Encounter (Signed)
Medication Samples have been provided to the patient.  Drug name: Corlanor     Strength: 5 mg        Qty: 2  LOT: [id number]  Exp.Date: 0721  Dosing instructions: Take 0.5 Tablet Twice Daily  The patient has been instructed regarding the correct time, dose, and frequency of taking this medication, including desired effects and most common side effects.   Darron Doom 2:10 PM 05/04/2017

## 2017-05-19 ENCOUNTER — Telehealth: Payer: Self-pay | Admitting: Pulmonary Disease

## 2017-05-19 NOTE — Telephone Encounter (Signed)
Routing this message to Dr. Vaughan Browner as an Juluis Rainier.

## 2017-06-14 ENCOUNTER — Other Ambulatory Visit: Payer: Self-pay

## 2017-06-14 ENCOUNTER — Encounter (HOSPITAL_COMMUNITY): Payer: Self-pay | Admitting: Internal Medicine

## 2017-06-14 ENCOUNTER — Ambulatory Visit (HOSPITAL_COMMUNITY)
Admission: RE | Admit: 2017-06-14 | Discharge: 2017-06-14 | Disposition: A | Discharge status: Home / Self Care | Payer: BLUE CROSS/BLUE SHIELD | Source: Ambulatory Visit | Attending: Internal Medicine | Admitting: Internal Medicine

## 2017-06-14 VITALS — BP 98/60 | HR 90 | Wt 94.0 lb

## 2017-06-14 DIAGNOSIS — Z9012 Acquired absence of left breast and nipple: Secondary | ICD-10-CM | POA: Insufficient documentation

## 2017-06-14 DIAGNOSIS — R918 Other nonspecific abnormal finding of lung field: Secondary | ICD-10-CM | POA: Insufficient documentation

## 2017-06-14 DIAGNOSIS — I252 Old myocardial infarction: Secondary | ICD-10-CM | POA: Diagnosis not present

## 2017-06-14 DIAGNOSIS — R55 Syncope and collapse: Secondary | ICD-10-CM | POA: Diagnosis not present

## 2017-06-14 DIAGNOSIS — Z9221 Personal history of antineoplastic chemotherapy: Secondary | ICD-10-CM | POA: Insufficient documentation

## 2017-06-14 DIAGNOSIS — Z8249 Family history of ischemic heart disease and other diseases of the circulatory system: Secondary | ICD-10-CM | POA: Diagnosis not present

## 2017-06-14 DIAGNOSIS — I2510 Atherosclerotic heart disease of native coronary artery without angina pectoris: Secondary | ICD-10-CM

## 2017-06-14 DIAGNOSIS — Z79899 Other long term (current) drug therapy: Secondary | ICD-10-CM | POA: Insufficient documentation

## 2017-06-14 DIAGNOSIS — Z7982 Long term (current) use of aspirin: Secondary | ICD-10-CM | POA: Insufficient documentation

## 2017-06-14 DIAGNOSIS — I5022 Chronic systolic (congestive) heart failure: Secondary | ICD-10-CM | POA: Diagnosis not present

## 2017-06-14 DIAGNOSIS — Z853 Personal history of malignant neoplasm of breast: Secondary | ICD-10-CM | POA: Diagnosis not present

## 2017-06-14 DIAGNOSIS — Z87891 Personal history of nicotine dependence: Secondary | ICD-10-CM | POA: Diagnosis not present

## 2017-06-14 DIAGNOSIS — I255 Ischemic cardiomyopathy: Secondary | ICD-10-CM | POA: Diagnosis not present

## 2017-06-14 DIAGNOSIS — Z803 Family history of malignant neoplasm of breast: Secondary | ICD-10-CM | POA: Diagnosis not present

## 2017-06-14 MED ORDER — IVABRADINE HCL 5 MG PO TABS: 5.0000 mg | ORAL_TABLET | Freq: Two times a day (BID) | ORAL | 11 refills | Status: DC

## 2017-06-14 MED ORDER — NITROGLYCERIN 0.4 MG SL SUBL: 0.4000 mg | SUBLINGUAL_TABLET | SUBLINGUAL | 3 refills | Status: DC | PRN

## 2017-06-14 NOTE — Patient Instructions (Signed)
Increase Corlanor to 5 mg Twice daily   We will contact you in 3 months to schedule your next appointment.

## 2017-06-14 NOTE — Progress Notes (Signed)
ADVANCED HF CLINIC  NOTE  Referring Physician: Gwenlyn Found  Primary Care: Dr. Garlon Hatchet  Primary Cardiologist: Gwenlyn Found   HPI:  Ms. Ates is a 62 year old woman with h/o tobacco abuse, breast cancer (s/p chemo and left mastectomy) and recently diagnosed systolic HF.    In 8/17 she was diagnosed with triple-negative breast cancer  with lymph node involvement. She had a clinically staged T4b, N1, M0 cancer which was stage IIIB. Her tumor was ER negative, PR negative and Her-2/neu negative. Her Ki67 was 30%. She underwent neoadjuvant chemotherapy including adriamycin (6 cycles)  followed by left mastectomy in 2/18 and XRT (completed 4/18).   Her EF 11/05/15 was 55% and by MUGA 04/06/16 was 69%. She was admitted to [facility] 08/02/16  with orthopnea and heart failure. She was diuresed 8 pounds. Her EF was 20-25% by echo on 08/03/16. RV was said to be normal. She saw Dr. Gwenlyn Found on 08/19/16. She was started on low-dose carvedilol. Lisinopril and lasix stopped by Dr. Garlon Hatchet on 08/15/16 due to low BP.   Admitted 1/1 with chest pain and shortness of breath. Had LHC/RHC unchanged 3 vessel disease, Placed on milrinone but later weaned off. Weight was 90 pounds.   Today she returns for hospital follow up. Not much change. Still very limited. Can do ADLs but that is about it. Tried CR but was very winded and tachycardic. Has not gone back. Sister had major MI and now wearing LifeVest. No edema, orthopnea or PND. Insurance not approving ivabradine - has one bottle left. Occasional CP - she is unsure if related to CAD or breast surgery.   CPX 11/18 FVC 2.01 (66%)    FEV1 1.53 (64%)     FEV1/FVC 76 (96%)     MVV 63 (70%)   Resting HR: 88 Peak HR: 126  (79% age predicted max HR)  BP rest: 124/76 BP peak: 158/70 Peak VO2: 16.8 (67% predicted peak VO2) VE/VCO2 slope: 48 OUES: 0.71 Peak RER: 1.07 Ventilatory Threshold: 12.7 (50% predicted and 76% measured peak VO2) VE/MVV: 51% O2pulse: 7  (100%  predicted O2pulse)   ECHO 11/2016 EF 20-25% grade I DD.   RHC/LHC 03/16/2017   Mid LAD-1 lesion is 40% stenosed.  Mid LAD-2 lesion is 95% stenosed.  Ost 2nd Diag to 2nd Diag lesion is 50% stenosed.  Ost Cx to Prox Cx lesion is 60% stenosed.  1st Mrg lesion is 100% stenosed.  Prox RCA lesion is 90% stenosed.  Mid RCA lesion is 30% stenosed.  Post Atrio lesion is 100% stenosed.  Dist RCA lesion is 100% stenosed.  Mid Cx lesion is 95% stenosed.  Dist Cx lesion is 60% stenosed.  Findings: Ao = 142/80 (105) LV = 158/25 RA = 1 RV = 29/2 PA = 30/19 (20) PCW = 20 Fick cardiac output/index = 2.4/1.7 PVR = 5.0 WU SVR = 3452  FA sat = 97% PA sat = 57%, 58% Assessment: 1. Severe 3v CAD unchanged from previous. No obvious culprit lesion for NSTEMI 2. Severe ischemic CM with EF 15% and low output physiology   RHC/LHC 11/07/2016   Prox RCA lesion, 90 %stenosed.  Post Atrio lesion, 100 %stenosed.  Mid RCA lesion, 30 %stenosed.  Ost Cx to Prox Cx lesion, 80 %stenosed.  1st Mrg lesion, 100 %stenosed.  Dist Cx lesion, 90 %stenosed.  Dist LAD lesion, 80 %stenosed.  Mid LAD-2 lesion, 70 %stenosed.  Mid LAD-1 lesion, 40 %stenosed.  Ost 2nd Diag to 2nd Diag lesion, 99 %stenosed.  Ao =  135/72 (97) LV = 135/15 RA = 1 RV = 28/3 PA = 29/8 (17) PCW = 5 Fick cardiac output/index = 2.6/1.8 Thermo CO/CI = 7.0/5.0 PVR = 4.8 WU FA sat = 92% PA sat = 58%, 60%   PMHx:  . Allergic rhinitis 08/15/2016  . Chronic systolic heart failure ([place]) 08/15/2016  . Malignant neoplasm of overlapping sites of left female breast ([place]) 10/19/2015      Current Outpatient Medications  Medication Sig Dispense Refill  . acetaminophen (TYLENOL) 500 MG tablet Take 1,000 mg by mouth daily as needed for moderate pain or headache.     Marland Kitchen aspirin 81 MG chewable tablet Chew 1 tablet (81 mg total) by mouth daily. 90 tablet 4  . atorvastatin (LIPITOR) 80 MG tablet Take 1 tablet (80 mg total)  by mouth daily at 6 PM. 90 tablet 4  . buPROPion (WELLBUTRIN) 100 MG tablet Take 1 & 1/2 tablets by mouth once daily for 3 days. Then increase to 1 & 1/2 tablets twice daily. (Patient taking differently: Take 150 mg by mouth daily. ) 180 tablet 1  . digoxin (LANOXIN) 0.125 MG tablet Take 0.5 tablets (0.0625 mg total) by mouth daily. 45 tablet 3  . diphenhydrAMINE (BENADRYL) 25 mg capsule Take 25 mg by mouth every 6 (six) hours as needed for allergies.    . furosemide (LASIX) 20 MG tablet Take 20 mg by mouth 2 (two) times daily as needed for fluid or edema.     . ivabradine (CORLANOR) 5 MG TABS tablet Take 0.5 tablets (2.5 mg total) by mouth 2 (two) times daily with a meal. 30 tablet 11  . MISC NATURAL PRODUCTS PO Take 1 tablet by mouth daily.    . ondansetron (ZOFRAN) 4 MG tablet Take 4 mg by mouth every 4 (four) hours as needed for nausea/vomiting.  2  . oxymetazoline (AFRIN) 0.05 % nasal spray Place 1 spray into both nostrils at bedtime as needed for congestion.    . potassium chloride (K-DUR,KLOR-CON) 10 MEQ tablet Take 40 mEq by mouth daily as needed (swelling). With first dose of furosemide    . Probiotic Product (PROBIOTIC ADVANCED PO) Take 1 capsule by mouth daily.      No current facility-administered medications for this encounter.     Allergies  Allergen Reactions  . Chantix [Varenicline] Nausea And Vomiting  . Codeine Other (See Comments)    GI Upset, headaches        Social History   Socioeconomic History  . Marital status: Divorced    Spouse name: Not on file  . Number of children: Not on file  . Years of education: Not on file  . Highest education level: Not on file  Occupational History  . Not on file  Social Needs  . Financial resource strain: Not on file  . Food insecurity:    Worry: Not on file    Inability: Not on file  . Transportation needs:    Medical: Not on file    Non-medical: Not on file  Tobacco Use  . Smoking status: Former Smoker    Packs/day:  1.00    Years: 38.00    Pack years: 38.00    Types: Cigarettes    Last attempt to quit: 11/23/2016    Years since quitting: 0.5  . Smokeless tobacco: Never Used  Substance and Sexual Activity  . Alcohol use: No    Frequency: Never  . Drug use: No  . Sexual activity: Not Currently  Lifestyle  .  Physical activity:    Days per week: Not on file    Minutes per session: Not on file  . Stress: Not on file  Relationships  . Social connections:    Talks on phone: Not on file    Gets together: Not on file    Attends religious service: Not on file    Active member of club or organization: Not on file    Attends meetings of clubs or organizations: Not on file    Relationship status: Not on file  . Intimate partner violence:    Fear of current or ex partner: Not on file    Emotionally abused: Not on file    Physically abused: Not on file    Forced sexual activity: Not on file  Other Topics Concern  . Not on file  Social History Narrative  . Not on file    Family History   . Heart disease Mother - + CAD with CABG at age 8 . Kidney cancer Mother  . Heart disease Father  - CABG and AVR in 25s . Breast cancer Paternal Aunt  . Breast cancer Cousin  . Colon cancer Neg Hx    - 3 sisters - no CAD       Vitals:   06/14/17 1054  BP: 98/60  Pulse: 90  SpO2: 100%  Weight: 94 lb (42.6 kg)    PHYSICAL EXAM: General:  Frail weak appearing. No resp difficulty HEENT: normal Neck: supple. no JVD. Carotids 2+ bilat; no bruits. No lymphadenopathy or thryomegaly appreciated. Cor: PMI nondisplaced. Tachy regular. 2/6 AS + s3 Lungs: clear Abdomen: soft, nontender, nondistended. No hepatosplenomegaly. No bruits or masses. Good bowel sounds. Extremities: no cyanosis, clubbing, rash, edema Neuro: alert & orientedx3, cranial nerves grossly intact. moves all 4 extremities w/o difficulty. Affect pleasant   ASSESSMENT & PLAN: 1. Chronic systolic HF - 06/9673 .  ECHO EF 20-25%.  - LHC  with severe 3 vessel disease.  - Poor candidate for CABG or PCI due to diffuse CAD - No bb with low output.  - Off losartan due to hypotension  - Off lasix and spiro due to volume depletion - On digoxin  - On corlanor 2.5 bid will attempt to increase to 5 bid. We will have PharmD see to help with approval - CPX reveals moderate to severe HF limitation with very high VEVCO2 slope. Spirometry not too bad. We had long talk about her situation. Not transplant candidate with severe PAD and recent breast CA. - Failed milrinone attempt in 1/19 due to worsening tachycardia - She remains end-stage with Class D HF. Not candidate for advanced therapies and has failed inotropes. We offered her an evaluation at [facility] for 2nd opinion but she has declined. We again discussed Palliative Care but she feels that she is not ready at this time. She has been referred to CR at [facility] but unable to tolerate due to fatigue.  2. Presyncope - Improved. Event monitor ok - Encouraged salt and fluid intake.  - Can add midodrine as needed 3. 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 S/p Adjuvant chemotherapy followed by left mastectomy and LN dissection 2/18 and XRT. Now complete. 3. Tobacco use -has quit 4. Pulmonary Nodules- saw Dr Vaughan Browner. CTwith scattered nodules. Thought to be non specific inflammation. - Plan for CT f/uin a year.  5. CAD - s/p NSTEM 1/19/ LHC 03/2017  Severe 3v CAD unchanged from previous. No obvious culprit lesion for NSTEMI - continue ASA/statin - occasional  CP. Will give her NTG prn    [name], MD  11:08 AM

## 2017-06-15 ENCOUNTER — Telehealth (HOSPITAL_COMMUNITY): Payer: Self-pay | Admitting: *Deleted

## 2017-06-15 NOTE — Telephone Encounter (Signed)
Completed prior auth for pt's Corlanor, med approved under [employer] through 03/13/38, pt aware

## 2017-06-28 ENCOUNTER — Other Ambulatory Visit: Payer: Self-pay | Admitting: Cardiovascular Disease

## 2017-06-28 NOTE — Telephone Encounter (Signed)
According to most recent office visit pt takes Bupropion differently. Updated instructions and refills were sent in.

## 2017-08-08 ENCOUNTER — Telehealth (HOSPITAL_COMMUNITY): Payer: Self-pay | Admitting: *Deleted

## 2017-08-08 MED ORDER — MIDODRINE HCL 2.5 MG PO TABS: 2.5000 mg | ORAL_TABLET | Freq: Three times a day (TID) | ORAL | 3 refills | Status: DC

## 2017-08-08 NOTE — Telephone Encounter (Signed)
Start midodrine 2.5 TID

## 2017-08-08 NOTE — Telephone Encounter (Signed)
Pt aware and agreeable, rx sent in 

## 2017-08-08 NOTE — Telephone Encounter (Signed)
Pt called c/o increased dizziness/lightheadedness since Saturday.  She states every time she stands up she feels this way.  She reports that she has checked her BP and it does drop when she stands.  This morning she was at 105/57 sitting and dropped to 98/76 with standing and became very dizzy.  She states her wt is stable running 92-93 lbs.  She states she has been drinking plenty of water and eating a lot of salt to help but it hasn't helped, she has not taken any Lasix in a long time.  Per Dr Bensimhon's last note may need to add midodrine.  Will send to him for further review and call her back later, she is agreeable.

## 2017-08-15 NOTE — Telephone Encounter (Signed)
   Please call increase midodrine 5 mg three times a day.   Kelleigh Skerritt NP-C  4:14 PM

## 2017-08-15 NOTE — Telephone Encounter (Signed)
Patient called back today and still having the same issues prior to starting midodrine.  Her BP with standing this morning was 97/40 and continues to feel dizzy/lightheadedness.   Will forward to Darrick Grinder, NP to review and will call patient back with her response.

## 2017-08-16 MED ORDER — MIDODRINE HCL 5 MG PO TABS: 5.0000 mg | ORAL_TABLET | Freq: Three times a day (TID) | ORAL | 3 refills | Status: DC

## 2017-08-16 NOTE — Telephone Encounter (Signed)
Called patient and she agrees with plan.  I also educated patient rising slowly and help with dizziness.  MAR updated and new dosage sent to preferred pharmacy.

## 2017-08-16 NOTE — Addendum Note (Signed)
Addended by: Darron Doom on: 08/16/2017 09:19 AM   Modules accepted: Orders

## 2017-09-03 ENCOUNTER — Other Ambulatory Visit (HOSPITAL_COMMUNITY): Payer: Self-pay | Admitting: Internal Medicine

## 2017-09-11 ENCOUNTER — Other Ambulatory Visit: Payer: Self-pay | Admitting: Cardiovascular Disease

## 2017-09-18 NOTE — Telephone Encounter (Signed)
Rx request sent to pharmacy.  

## 2017-09-21 ENCOUNTER — Other Ambulatory Visit: Payer: Self-pay | Admitting: Cardiovascular Disease

## 2017-09-25 DIAGNOSIS — R718 Other abnormality of red blood cells: Secondary | ICD-10-CM

## 2017-09-25 DIAGNOSIS — I509 Heart failure, unspecified: Secondary | ICD-10-CM

## 2017-09-25 DIAGNOSIS — Z9012 Acquired absence of left breast and nipple: Secondary | ICD-10-CM

## 2017-09-25 DIAGNOSIS — I2510 Atherosclerotic heart disease of native coronary artery without angina pectoris: Secondary | ICD-10-CM

## 2017-09-25 DIAGNOSIS — Z923 Personal history of irradiation: Secondary | ICD-10-CM | POA: Diagnosis not present

## 2017-09-25 DIAGNOSIS — Z9221 Personal history of antineoplastic chemotherapy: Secondary | ICD-10-CM | POA: Diagnosis not present

## 2017-09-25 DIAGNOSIS — Z853 Personal history of malignant neoplasm of breast: Secondary | ICD-10-CM

## 2017-09-25 DIAGNOSIS — I252 Old myocardial infarction: Secondary | ICD-10-CM

## 2017-10-04 ENCOUNTER — Ambulatory Visit (HOSPITAL_COMMUNITY)
Admission: RE | Admit: 2017-10-04 | Discharge: 2017-10-04 | Disposition: A | Discharge status: Home / Self Care | Payer: BLUE CROSS/BLUE SHIELD | Source: Ambulatory Visit | Attending: Internal Medicine | Admitting: Internal Medicine

## 2017-10-04 VITALS — BP 134/74 | HR 92 | Wt 95.4 lb

## 2017-10-04 DIAGNOSIS — R251 Tremor, unspecified: Secondary | ICD-10-CM | POA: Insufficient documentation

## 2017-10-04 DIAGNOSIS — Z7982 Long term (current) use of aspirin: Secondary | ICD-10-CM | POA: Insufficient documentation

## 2017-10-04 DIAGNOSIS — I2510 Atherosclerotic heart disease of native coronary artery without angina pectoris: Secondary | ICD-10-CM

## 2017-10-04 DIAGNOSIS — R55 Syncope and collapse: Secondary | ICD-10-CM | POA: Diagnosis not present

## 2017-10-04 DIAGNOSIS — Z87891 Personal history of nicotine dependence: Secondary | ICD-10-CM | POA: Diagnosis not present

## 2017-10-04 DIAGNOSIS — Z79899 Other long term (current) drug therapy: Secondary | ICD-10-CM | POA: Insufficient documentation

## 2017-10-04 DIAGNOSIS — C50912 Malignant neoplasm of unspecified site of left female breast: Secondary | ICD-10-CM | POA: Diagnosis not present

## 2017-10-04 DIAGNOSIS — Z9012 Acquired absence of left breast and nipple: Secondary | ICD-10-CM | POA: Insufficient documentation

## 2017-10-04 DIAGNOSIS — R918 Other nonspecific abnormal finding of lung field: Secondary | ICD-10-CM | POA: Insufficient documentation

## 2017-10-04 DIAGNOSIS — Z9221 Personal history of antineoplastic chemotherapy: Secondary | ICD-10-CM | POA: Diagnosis not present

## 2017-10-04 DIAGNOSIS — I5022 Chronic systolic (congestive) heart failure: Secondary | ICD-10-CM | POA: Insufficient documentation

## 2017-10-04 DIAGNOSIS — Z885 Allergy status to narcotic agent status: Secondary | ICD-10-CM | POA: Insufficient documentation

## 2017-10-04 LAB — BASIC METABOLIC PANEL
Anion gap: 12 (ref 5–15)
BUN: 10 mg/dL (ref 8–23)
CHLORIDE: 100 mmol/L (ref 98–111)
CO2: 24 mmol/L (ref 22–32)
Calcium: 9.4 mg/dL (ref 8.9–10.3)
Creatinine, Ser: 0.66 mg/dL (ref 0.44–1.00)
GFR calc non Af Amer: >60 mL/min (ref >60)
Glucose, Bld: 94 mg/dL (ref 70–99)
POTASSIUM: 3.8 mmol/L (ref 3.5–5.1)
Sodium: 136 mmol/L (ref 135–145)

## 2017-10-04 NOTE — Patient Instructions (Signed)
Lab today  Your physician recommends that you schedule a follow-up appointment in: 4 months  

## 2017-10-04 NOTE — Addendum Note (Signed)
Encounter addended by: Scarlette Calico, RN on: 10/04/2017 11:04 AM  Actions taken: Order list changed, Diagnosis association updated, Sign clinical note

## 2017-10-04 NOTE — Progress Notes (Signed)
Advanced Heart Failure Clinic Note    Referring Physician: Gwenlyn Found  Primary Care: Dr. Garlon Hatchet  Primary Cardiologist: Gwenlyn Found   HPI:  Robin Rose is a 62 y.o. female  with h/o tobacco abuse, breast cancer (s/p chemo and left mastectomy) and recently diagnosed systolic HF.    In 8/17 she was diagnosed with triple-negative breast cancer  with lymph node involvement. She had a clinically staged T4b, N1, M0 cancer which was stage IIIB. Her tumor was ER negative, PR negative and Her-2/neu negative. Her Ki67 was 30%. She underwent neoadjuvant chemotherapy including adriamycin (6 cycles)  followed by left mastectomy in 2/18 and XRT (completed 4/18).   Her EF 11/05/15 was 55% and by MUGA 04/06/16 was 69%. She was admitted to [facility] 08/02/16  with orthopnea and heart failure. She was diuresed 8 pounds. Her EF was 20-25% by echo on 08/03/16. RV was said to be normal. She saw Dr. Gwenlyn Found on 08/19/16. She was started on low-dose carvedilol. Lisinopril and lasix stopped by Dr. Garlon Hatchet on 08/15/16 due to low BP.   Admitted 1/1 with chest pain and shortness of breath. Had LHC/RHC unchanged 3 vessel disease, Placed on milrinone but later weaned off. Weight was 90 pounds.   She presents today for regular follow up. Since last visit, started on and titrated up on midodrine. Now on 5 tid. Her dizziness has felt much better. Having "shaking" spells. Has several a week. Just trembles, not to the point where she has to stop the car. She says she just feel "really nervous." No clear trigger. Has been going on for about a couple weeks. No way to predict. Nothing makes it better or worse.  Denies edema. Did take lasix last week for orthopnea, which was the first time in months. No further chest pain. Taking all medications as directed. Very weak. SOB with almost any activity. Sister does grocery shopping.   CPX 11/18 FVC 2.01 (66%)    FEV1 1.53 (64%)     FEV1/FVC 76 (96%)     MVV 63 (70%)   Resting HR: 88  Peak HR: 126  (79% age predicted max HR)  BP rest: 124/76 BP peak: 158/70 Peak VO2: 16.8 (67% predicted peak VO2) VE/VCO2 slope: 48 OUES: 0.71 Peak RER: 1.07 Ventilatory Threshold: 12.7 (50% predicted and 76% measured peak VO2) VE/MVV: 51% O2pulse: 7  (100% predicted O2pulse)   ECHO 11/2016 EF 20-25% grade I DD.   RHC/LHC 03/16/2017   Mid LAD-1 lesion is 40% stenosed.  Mid LAD-2 lesion is 95% stenosed.  Ost 2nd Diag to 2nd Diag lesion is 50% stenosed.  Ost Cx to Prox Cx lesion is 60% stenosed.  1st Mrg lesion is 100% stenosed.  Prox RCA lesion is 90% stenosed.  Mid RCA lesion is 30% stenosed.  Post Atrio lesion is 100% stenosed.  Dist RCA lesion is 100% stenosed.  Mid Cx lesion is 95% stenosed.  Dist Cx lesion is 60% stenosed.  Findings: Ao = 142/80 (105) LV = 158/25 RA = 1 RV = 29/2 PA = 30/19 (20) PCW = 20 Fick cardiac output/index = 2.4/1.7 PVR = 5.0 WU SVR = 3452  FA sat = 97% PA sat = 57%, 58% Assessment: 1. Severe 3v CAD unchanged from previous. No obvious culprit lesion for NSTEMI 2. Severe ischemic CM with EF 15% and low output physiology   RHC/LHC 11/07/2016   Prox RCA lesion, 90 %stenosed.  Post Atrio lesion, 100 %stenosed.  Mid RCA lesion, 30 %stenosed.  Ost Cx  to Prox Cx lesion, 80 %stenosed.  1st Mrg lesion, 100 %stenosed.  Dist Cx lesion, 90 %stenosed.  Dist LAD lesion, 80 %stenosed.  Mid LAD-2 lesion, 70 %stenosed.  Mid LAD-1 lesion, 40 %stenosed.  Ost 2nd Diag to 2nd Diag lesion, 99 %stenosed.  Ao = 135/72 (97) LV = 135/15 RA = 1 RV = 28/3 PA = 29/8 (17) PCW = 5 Fick cardiac output/index = 2.6/1.8 Thermo CO/CI = 7.0/5.0 PVR = 4.8 WU FA sat = 92% PA sat = 58%, 60%   PMHx:  . Allergic rhinitis 08/15/2016  . Chronic systolic heart failure ([place]) 08/15/2016  . Malignant neoplasm of overlapping sites of left female breast ([place]) 10/19/2015      Current Outpatient Medications  Medication Sig Dispense Refill    . acetaminophen (TYLENOL) 500 MG tablet Take 1,000 mg by mouth daily as needed for moderate pain or headache.     Marland Kitchen aspirin 81 MG chewable tablet Chew 1 tablet (81 mg total) by mouth daily. 90 tablet 4  . buPROPion (WELLBUTRIN) 100 MG tablet Take 150 mg by mouth daily 180 tablet 1  . digoxin (LANOXIN) 0.125 MG tablet Take 0.5 tablets (0.0625 mg total) by mouth daily. 45 tablet 3  . diphenhydrAMINE (BENADRYL) 25 mg capsule Take 25 mg by mouth every 6 (six) hours as needed for allergies.    . furosemide (LASIX) 20 MG tablet Take 20 mg by mouth 2 (two) times daily as needed for fluid or edema.     . ivabradine (CORLANOR) 5 MG TABS tablet Take 1 tablet (5 mg total) by mouth 2 (two) times daily with a meal. 60 tablet 11  . midodrine (PROAMATINE) 5 MG tablet Take 1 tablet (5 mg total) by mouth 3 (three) times daily with meals. 90 tablet 3  . MISC NATURAL PRODUCTS PO Take 1 tablet by mouth daily.    . nitroGLYCERIN (NITROSTAT) 0.4 MG SL tablet Place 1 tablet (0.4 mg total) under the tongue every 5 (five) minutes as needed for chest pain. 25 tablet 3  . ondansetron (ZOFRAN) 4 MG tablet Take 4 mg by mouth every 4 (four) hours as needed for nausea/vomiting.  2  . oxymetazoline (AFRIN) 0.05 % nasal spray Place 1 spray into both nostrils at bedtime as needed for congestion.    . potassium chloride (K-DUR,KLOR-CON) 10 MEQ tablet Take 40 mEq by mouth daily as needed (swelling). With first dose of furosemide    . Probiotic Product (PROBIOTIC ADVANCED PO) Take 1 capsule by mouth daily.     Marland Kitchen atorvastatin (LIPITOR) 80 MG tablet Take 1 tablet (80 mg total) by mouth daily at 6 PM. (Patient not taking: Reported on 10/04/2017) 90 tablet 4   No current facility-administered medications for this encounter.     Allergies  Allergen Reactions  . Chantix [Varenicline] Nausea And Vomiting  . Codeine Other (See Comments)    GI Upset, headaches        Social History   Socioeconomic History  . Marital status:  Divorced    Spouse name: Not on file  . Number of children: Not on file  . Years of education: Not on file  . Highest education level: Not on file  Occupational History  . Not on file  Social Needs  . Financial resource strain: Not on file  . Food insecurity:    Worry: Not on file    Inability: Not on file  . Transportation needs:    Medical: Not on file  Non-medical: Not on file  Tobacco Use  . Smoking status: Former Smoker    Packs/day: 1.00    Years: 38.00    Pack years: 38.00    Types: Cigarettes    Last attempt to quit: 11/23/2016    Years since quitting: 0.8  . Smokeless tobacco: Never Used  Substance and Sexual Activity  . Alcohol use: No    Frequency: Never  . Drug use: No  . Sexual activity: Not Currently  Lifestyle  . Physical activity:    Days per week: Not on file    Minutes per session: Not on file  . Stress: Not on file  Relationships  . Social connections:    Talks on phone: Not on file    Gets together: Not on file    Attends religious service: Not on file    Active member of club or organization: Not on file    Attends meetings of clubs or organizations: Not on file    Relationship status: Not on file  . Intimate partner violence:    Fear of current or ex partner: Not on file    Emotionally abused: Not on file    Physically abused: Not on file    Forced sexual activity: Not on file  Other Topics Concern  . Not on file  Social History Narrative  . Not on file   Family History   . Heart disease Mother - + CAD with CABG at age 14 . Kidney cancer Mother  . Heart disease Father  - CABG and AVR in 45s . Breast cancer Paternal Aunt  . Breast cancer Cousin  . Colon cancer Neg Hx    - 3 sisters - no CAD   Vitals:   10/04/17 1009  BP: 134/74  Pulse: 92  SpO2: 100%  Weight: 95 lb 6.4 oz (43.3 kg)   Wt Readings from Last 3 Encounters:  10/04/17 95 lb 6.4 oz (43.3 kg)  06/14/17 94 lb (42.6 kg)  04/10/17 94 lb (42.6 kg)    PHYSICAL  EXAM: General: Frail. Weak appearing. Appears older than stated age.  HEENT: Normal anicteric  Neck: Supple. JVP 5-6. Carotids 2+ bilat; no bruits. No thyromegaly or nodule noted. Cor: PMI laterally displaced. Regular, slightly tachy. 2/6 AS. +S3. Lungs: CTAB, normal effort. No wheeze  Abdomen: Soft, non-tender, non-distended, no HSM. No bruits or masses. +BS  Extremities: No cyanosis, clubbing, or rash. R and LLE no edema.  warm Neuro: Alert & orientedx3, cranial nerves grossly intact. moves all 4 extremities w/o difficulty. Affect pleasant   ASSESSMENT & PLAN: 1. Chronic systolic HF - 08/3843 .  ECHO EF 20-25%.  - LHC with severe 3 vessel disease.  - Poor candidate for CABG or PCI due to diffuse CAD - No bb with low output.  - Off losartan due to hypotension  - Off lasix and spiro due to volume depletion - On digoxin  - Continue corlanor 5 mg BID.  - CPX reveals moderate to severe HF limitation with very high VEVCO2 slope. Spirometry not too bad. We had long talk about her situation. Not transplant candidate with severe PAD and recent breast CA. - Failed milrinone attempt in 1/19 due to worsening tachycardia - She remains end-stage with Class D HF. Not candidate for advanced therapies and has failed inotropes. We offered her an evaluation at [facility] for 2nd opinion but she has declined. We again discussed Palliative Care but she feels that she is not ready at this time. She  has been referred to CR at [facility] but unable to tolerate due to fatigue.  2. Presyncope - No further dizziness since staring on midodrine 5 mg TID.  - Event monitor ok - Encouraged salt and fluid intake.  3. 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 S/p Adjuvant chemotherapy followed by left mastectomy and LN dissection 2/18 and XRT. Now complete. - - No change to current plan.   4. Tobacco use - Has stopped.  5. Pulmonary Nodules - saw Dr Vaughan Browner. CTwith scattered nodules. Thought to be non specific  inflammation.  - Plan for CT 12/2017. (last done 12/2016)   6. CAD - s/p NSTEM 1/19/ LHC 03/2017  Severe 3v CAD unchanged from previous. No obvious culprit lesion for NSTEMI - No s/s of ischemia.    - continue ASA/statin - Continue prn NTG.  7. Tremors - Midodrine is her only new medications. It has an 18% incidence of "parasthesias" and a < 5% incidence of chills, but neither of these sound quite like what she is describing.    Shirley Friar, PA-C  10:26 AM   Patient seen and examined with the above-signed Advanced Practice Provider and/or Housestaff. I personally reviewed laboratory data, imaging studies and relevant notes. I independently examined the patient and formulated the important aspects of the plan. I have edited the note to reflect any of my changes or salient points. I have personally discussed the plan with the patient and/or family.  Continues with NYHA IIIB symptoms. Stable though. Volume status looks good. Takes lasix as needed. BP much improved with midodrine. I do not know what is causing her shaking episodes, if persists will ask Neurology to see. I have asked her to take a video of it, if possible to share. Not candidate for advanced therapies.   [name], MD  10:59 AM

## 2017-10-06 ENCOUNTER — Other Ambulatory Visit (HOSPITAL_COMMUNITY): Payer: Self-pay | Admitting: Internal Medicine

## 2017-12-12 ENCOUNTER — Other Ambulatory Visit (HOSPITAL_COMMUNITY): Payer: Self-pay | Admitting: Adult Health

## 2018-01-31 ENCOUNTER — Encounter (HOSPITAL_COMMUNITY): Payer: Self-pay | Admitting: Internal Medicine

## 2018-01-31 ENCOUNTER — Ambulatory Visit (HOSPITAL_COMMUNITY)
Admission: RE | Admit: 2018-01-31 | Discharge: 2018-01-31 | Disposition: A | Discharge status: Home / Self Care | Payer: BLUE CROSS/BLUE SHIELD | Source: Ambulatory Visit | Attending: Internal Medicine | Admitting: Internal Medicine

## 2018-01-31 VITALS — BP 102/58 | HR 81 | Wt 99.4 lb

## 2018-01-31 DIAGNOSIS — R079 Chest pain, unspecified: Secondary | ICD-10-CM

## 2018-01-31 DIAGNOSIS — I951 Orthostatic hypotension: Secondary | ICD-10-CM | POA: Insufficient documentation

## 2018-01-31 DIAGNOSIS — I2510 Atherosclerotic heart disease of native coronary artery without angina pectoris: Secondary | ICD-10-CM | POA: Diagnosis not present

## 2018-01-31 DIAGNOSIS — Z8249 Family history of ischemic heart disease and other diseases of the circulatory system: Secondary | ICD-10-CM | POA: Insufficient documentation

## 2018-01-31 DIAGNOSIS — Z9221 Personal history of antineoplastic chemotherapy: Secondary | ICD-10-CM | POA: Diagnosis not present

## 2018-01-31 DIAGNOSIS — I5022 Chronic systolic (congestive) heart failure: Secondary | ICD-10-CM | POA: Insufficient documentation

## 2018-01-31 DIAGNOSIS — Z79899 Other long term (current) drug therapy: Secondary | ICD-10-CM | POA: Diagnosis not present

## 2018-01-31 DIAGNOSIS — Z9012 Acquired absence of left breast and nipple: Secondary | ICD-10-CM | POA: Insufficient documentation

## 2018-01-31 DIAGNOSIS — Z87891 Personal history of nicotine dependence: Secondary | ICD-10-CM | POA: Diagnosis not present

## 2018-01-31 DIAGNOSIS — Z885 Allergy status to narcotic agent status: Secondary | ICD-10-CM | POA: Insufficient documentation

## 2018-01-31 DIAGNOSIS — Z7982 Long term (current) use of aspirin: Secondary | ICD-10-CM | POA: Insufficient documentation

## 2018-01-31 DIAGNOSIS — I255 Ischemic cardiomyopathy: Secondary | ICD-10-CM | POA: Insufficient documentation

## 2018-01-31 DIAGNOSIS — R918 Other nonspecific abnormal finding of lung field: Secondary | ICD-10-CM | POA: Diagnosis not present

## 2018-01-31 DIAGNOSIS — Z853 Personal history of malignant neoplasm of breast: Secondary | ICD-10-CM | POA: Insufficient documentation

## 2018-01-31 NOTE — Patient Instructions (Signed)
Today you have been seen at the Heart failure clinic at [facility]   Follow up with Dr. Haroldine Laws in 3 months.  You have the following test scheduled for:  ECHO   Do the following things EVERYDAY: 1) Weigh yourself in the morning before breakfast. Write it down and keep it in a log. 2) Take your medicines as prescribed 3) Eat low salt foods-Limit salt (sodium) to 2000 mg per day.  4) Stay as active as you can everyday 5) Limit all fluids for the day to less than 2 liters   Your physician has requested that you have an echocardiogram. Echocardiography is a painless test that uses sound waves to create images of your heart. It provides your doctor with information about the size and shape of your heart and how well your heart's chambers and valves are working. This procedure takes approximately one hour. There are no restrictions for this procedure.

## 2018-01-31 NOTE — Progress Notes (Signed)
Advanced Heart Failure Clinic Note    Referring Physician: Gwenlyn Found  Primary Care: Dr. Garlon Hatchet  Primary Cardiologist: Gwenlyn Found   HPI:  Robin Rose is a 62 y.o. female  with h/o tobacco abuse, breast cancer (s/p chemo and left mastectomy) and recently diagnosed systolic HF.    In 8/17 she was diagnosed with triple-negative breast cancer  with lymph node involvement. She had a clinically staged T4b, N1, M0 cancer which was stage IIIB. Her tumor was ER negative, PR negative and Her-2/neu negative. Her Ki67 was 30%. She underwent neoadjuvant chemotherapy including adriamycin (6 cycles)  followed by left mastectomy in 2/18 and XRT (completed 4/18).   Her EF 11/05/15 was 55% and by MUGA 04/06/16 was 69%. She was admitted to [facility] 08/02/16  with orthopnea and heart failure. She was diuresed 8 pounds. Her EF was 20-25% by echo on 08/03/16. RV was said to be normal. She saw Dr. Gwenlyn Found on 08/19/16. She was started on low-dose carvedilol. Lisinopril and lasix stopped by Dr. Garlon Hatchet on 08/15/16 due to low BP.   Admitted 1/1 with chest pain and shortness of breath. Had LHC/RHC unchanged 3 vessel disease, Placed on milrinone but later weaned off. Weight was 90 pounds.   She presents today for regular follow up. She remains on midodrine. Feels like she is getting a bit stronger. Able to ADLs more easily. Cleaned her apt the other day and wiped her out for 2 days. No swelling, CP, orthopnea and PND. Does have easy DOE and fatigue. BP stable. Taking lasix only rarely.   CPX 11/18 FVC 2.01 (66%)    FEV1 1.53 (64%)     FEV1/FVC 76 (96%)     MVV 63 (70%)   Resting HR: 88 Peak HR: 126  (79% age predicted max HR)  BP rest: 124/76 BP peak: 158/70 Peak VO2: 16.8 (67% predicted peak VO2) VE/VCO2 slope: 48 OUES: 0.71 Peak RER: 1.07 Ventilatory Threshold: 12.7 (50% predicted and 76% measured peak VO2) VE/MVV: 51% O2pulse: 7  (100% predicted O2pulse)   ECHO 11/2016 EF 20-25% grade I DD.    RHC/LHC 03/16/2017   Mid LAD-1 lesion is 40% stenosed.  Mid LAD-2 lesion is 95% stenosed.  Ost 2nd Diag to 2nd Diag lesion is 50% stenosed.  Ost Cx to Prox Cx lesion is 60% stenosed.  1st Mrg lesion is 100% stenosed.  Prox RCA lesion is 90% stenosed.  Mid RCA lesion is 30% stenosed.  Post Atrio lesion is 100% stenosed.  Dist RCA lesion is 100% stenosed.  Mid Cx lesion is 95% stenosed.  Dist Cx lesion is 60% stenosed.  Findings: Ao = 142/80 (105) LV = 158/25 RA = 1 RV = 29/2 PA = 30/19 (20) PCW = 20 Fick cardiac output/index = 2.4/1.7 PVR = 5.0 WU SVR = 3452  FA sat = 97% PA sat = 57%, 58% Assessment: 1. Severe 3v CAD unchanged from previous. No obvious culprit lesion for NSTEMI 2. Severe ischemic CM with EF 15% and low output physiology   RHC/LHC 11/07/2016   Prox RCA lesion, 90 %stenosed.  Post Atrio lesion, 100 %stenosed.  Mid RCA lesion, 30 %stenosed.  Ost Cx to Prox Cx lesion, 80 %stenosed.  1st Mrg lesion, 100 %stenosed.  Dist Cx lesion, 90 %stenosed.  Dist LAD lesion, 80 %stenosed.  Mid LAD-2 lesion, 70 %stenosed.  Mid LAD-1 lesion, 40 %stenosed.  Ost 2nd Diag to 2nd Diag lesion, 99 %stenosed.  Ao = 135/72 (97) LV = 135/15 RA = 1 RV =  28/3 PA = 29/8 (17) PCW = 5 Fick cardiac output/index = 2.6/1.8 Thermo CO/CI = 7.0/5.0 PVR = 4.8 WU FA sat = 92% PA sat = 58%, 60%   PMHx:  . Allergic rhinitis 08/15/2016  . Chronic systolic heart failure ([place]) 08/15/2016  . Malignant neoplasm of overlapping sites of left female breast ([place]) 10/19/2015      Current Outpatient Medications  Medication Sig Dispense Refill  . acetaminophen (TYLENOL) 500 MG tablet Take 1,000 mg by mouth daily as needed for moderate pain or headache.     Marland Kitchen aspirin 81 MG chewable tablet Chew 1 tablet (81 mg total) by mouth daily. 90 tablet 4  . digoxin (LANOXIN) 0.125 MG tablet Take 0.5 tablets (0.0625 mg total) by mouth daily. 45 tablet 3  . diphenhydrAMINE  (BENADRYL) 25 mg capsule Take 25 mg by mouth every 6 (six) hours as needed for allergies.    . furosemide (LASIX) 20 MG tablet Take 20 mg by mouth 2 (two) times daily as needed for fluid or edema.     . ivabradine (CORLANOR) 5 MG TABS tablet Take 1 tablet (5 mg total) by mouth 2 (two) times daily with a meal. 60 tablet 11  . midodrine (PROAMATINE) 5 MG tablet TAKE 1 TABLET BY MOUTH 3 TIMES DAILY WITH MEALS. 90 tablet 3  . nitroGLYCERIN (NITROSTAT) 0.4 MG SL tablet PLACE 1 TABLET (0.4 MG TOTAL) UNDER THE TONGUE EVERY 5 (FIVE) MINUTES AS NEEDED FOR CHEST PAIN. 25 tablet 3  . ondansetron (ZOFRAN) 4 MG tablet Take 4 mg by mouth every 4 (four) hours as needed for nausea/vomiting.  2  . oxymetazoline (AFRIN) 0.05 % nasal spray Place 1 spray into both nostrils at bedtime as needed for congestion.    . potassium chloride (K-DUR,KLOR-CON) 10 MEQ tablet Take 40 mEq by mouth daily as needed (swelling). With first dose of furosemide    . Probiotic Product (PROBIOTIC ADVANCED PO) Take 1 capsule by mouth daily.      No current facility-administered medications for this encounter.     Allergies  Allergen Reactions  . Chantix [Varenicline] Nausea And Vomiting  . Codeine Other (See Comments)    GI Upset, headaches        Social History   Socioeconomic History  . Marital status: Divorced    Spouse name: Not on file  . Number of children: Not on file  . Years of education: Not on file  . Highest education level: Not on file  Occupational History  . Not on file  Social Needs  . Financial resource strain: Not on file  . Food insecurity:    Worry: Not on file    Inability: Not on file  . Transportation needs:    Medical: Not on file    Non-medical: Not on file  Tobacco Use  . Smoking status: Former Smoker    Packs/day: 1.00    Years: 38.00    Pack years: 38.00    Types: Cigarettes    Last attempt to quit: 11/23/2016    Years since quitting: 1.1  . Smokeless tobacco: Never Used  Substance  and Sexual Activity  . Alcohol use: No    Frequency: Never  . Drug use: No  . Sexual activity: Not Currently  Lifestyle  . Physical activity:    Days per week: Not on file    Minutes per session: Not on file  . Stress: Not on file  Relationships  . Social connections:    Talks on  phone: Not on file    Gets together: Not on file    Attends religious service: Not on file    Active member of club or organization: Not on file    Attends meetings of clubs or organizations: Not on file    Relationship status: Not on file  . Intimate partner violence:    Fear of current or ex partner: Not on file    Emotionally abused: Not on file    Physically abused: Not on file    Forced sexual activity: Not on file  Other Topics Concern  . Not on file  Social History Narrative  . Not on file   Family History   . Heart disease Mother - + CAD with CABG at age 34 . Kidney cancer Mother  . Heart disease Father  - CABG and AVR in 53s . Breast cancer Paternal Aunt  . Breast cancer Cousin  . Colon cancer Neg Hx    - 3 sisters - no CAD   Vitals:   01/31/18 1032  BP: (!) 102/58  Pulse: 81  SpO2: 100%  Weight: 45.1 kg (99 lb 6.4 oz)   Wt Readings from Last 3 Encounters:  01/31/18 45.1 kg (99 lb 6.4 oz)  10/04/17 43.3 kg (95 lb 6.4 oz)  06/14/17 42.6 kg (94 lb)    PHYSICAL EXAM: General:  Thin. Well appearing. No resp difficulty HEENT: normal Neck: supple. no JVD. Carotids 2+ bilat; no bruits. No lymphadenopathy or thryomegaly appreciated. Cor: PMI laterally displaced. Regular rate & rhythm. 2/6 AS Lungs: clear Abdomen: soft, nontender, nondistended. No hepatosplenomegaly. No bruits or masses. Good bowel sounds. Extremities: no cyanosis, clubbing, rash, edema Neuro: alert & orientedx3, cranial nerves grossly intact. moves all 4 extremities w/o difficulty. Affect pleasant   ASSESSMENT & PLAN: 1. Chronic systolic HF - 08/2227 .  ECHO EF 20-25%.  - LHC with severe 3 vessel disease.  -  Poor candidate for CABG or PCI due to diffuse CAD - No bb with low output.  - Off losartan due to hypotension  - Off lasix and spiro due to volume depletion - On digoxin  - Continue corlanor 5 mg BID.  - Overall doing better today. NYHA III. Volume status looks good with sliding scale lasix. BP too low to add back HF meds.  - CPX reveals moderate to severe HF limitation with very high VEVCO2 slope. Spirometry not too bad. We had long talk about her situation. Not transplant candidate with severe PAD and recent breast CA. - Failed milrinone attempt in 1/19 due to worsening tachycardia - Not candidate for advanced therapies and has failed inotropes. We offered her an evaluation at [facility] for 2nd opinion but she has declined. - f/u 3 months with echo 2. Presyncope - No further dizziness since staring on midodrine 5 mg TID.  - Event monitor ok - Encouraged salt and fluid intake.  3. 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 S/p Adjuvant chemotherapy followed by left mastectomy and LN dissection 2/18 and XRT. Now complete. - - No change to current plan.   4. Tobacco use - Has stopped.  5. Pulmonary Nodules - saw Dr Vaughan Browner. CTwith scattered nodules. Thought to be non specific inflammation.  - Plan for CT 12/2017. (last done 12/2016)   6. CAD - s/p NSTEM 1/19/ LHC 03/2017  Severe 3v CAD unchanged from previous. No obvious culprit lesion for NSTEMI - No s/s ischemia    - continue ASA/statin - Continue prn NTG.  7. Orthostatic  hypotension - resolved with midodrine   [name], MD  11:07 AM

## 2018-02-28 ENCOUNTER — Telehealth (HOSPITAL_COMMUNITY): Payer: Self-pay

## 2018-02-28 NOTE — Telephone Encounter (Signed)
disability paper work faxed to [employer] absence management C/O RP claims processing number [id number]. Claim number [id number] [id number],

## 2018-05-04 ENCOUNTER — Encounter (HOSPITAL_COMMUNITY): Payer: BLUE CROSS/BLUE SHIELD | Admitting: Internal Medicine

## 2018-05-04 ENCOUNTER — Ambulatory Visit (HOSPITAL_COMMUNITY): Payer: BLUE CROSS/BLUE SHIELD

## 2018-06-13 ENCOUNTER — Telehealth (HOSPITAL_COMMUNITY): Payer: Self-pay | Admitting: *Deleted

## 2018-06-13 NOTE — Telephone Encounter (Signed)
Received letter from [facility], they have provided pt with a 30 day supply of Corlanor and she will need a PA fur further refills.  Called pt, she states she has switched insurance company and now has [employer] [place] ID #: [id number] RX BIN: [id number] RX Grp: [id number]  Completed PA on CMM, will await approval

## 2018-06-14 NOTE — Telephone Encounter (Signed)
Corlanor approved through 06/13/2019, pt is aware

## 2018-06-15 ENCOUNTER — Other Ambulatory Visit (HOSPITAL_COMMUNITY): Payer: Self-pay | Admitting: Adult Health

## 2018-06-28 ENCOUNTER — Telehealth (HOSPITAL_COMMUNITY): Payer: Self-pay | Admitting: Vascular Surgery

## 2018-06-28 NOTE — Telephone Encounter (Signed)
Called pt to possibly cancel /reschedule 3 month f/u w/ echo w/ DB 4/29, pt wants her echo done she would like to speak to DB/ or his nurse to talk about how soon see can get echo done.. please advise

## 2018-06-29 NOTE — Telephone Encounter (Signed)
Spoke w/pt, explained appts cx due to Covid pandemic, she states she is feeling fine with no complaints or issues at this time, she is just eager to get her echo and see what the results are.  Sch pt for echo and f/u with Dr Haroldine Laws on 08/15/2018, she is agreeable to call us back if needed before then

## 2018-06-29 NOTE — Telephone Encounter (Signed)
Left message to call back  

## 2018-07-02 ENCOUNTER — Telehealth: Payer: Self-pay | Admitting: *Deleted

## 2018-07-10 ENCOUNTER — Other Ambulatory Visit (HOSPITAL_COMMUNITY): Payer: Self-pay | Admitting: Internal Medicine

## 2018-07-11 ENCOUNTER — Encounter (HOSPITAL_COMMUNITY): Payer: BLUE CROSS/BLUE SHIELD | Admitting: Internal Medicine

## 2018-07-11 ENCOUNTER — Other Ambulatory Visit (HOSPITAL_COMMUNITY): Payer: Medicare Other

## 2018-08-08 DIAGNOSIS — I2510 Atherosclerotic heart disease of native coronary artery without angina pectoris: Secondary | ICD-10-CM | POA: Diagnosis not present

## 2018-08-08 DIAGNOSIS — Z853 Personal history of malignant neoplasm of breast: Secondary | ICD-10-CM | POA: Diagnosis not present

## 2018-08-08 DIAGNOSIS — I509 Heart failure, unspecified: Secondary | ICD-10-CM | POA: Diagnosis not present

## 2018-08-15 ENCOUNTER — Ambulatory Visit (HOSPITAL_COMMUNITY)
Admission: RE | Admit: 2018-08-15 | Discharge: 2018-08-15 | Disposition: A | Discharge status: Home / Self Care | Payer: Medicare Other | Source: Ambulatory Visit | Attending: Internal Medicine | Admitting: Internal Medicine

## 2018-08-15 ENCOUNTER — Other Ambulatory Visit: Payer: Self-pay

## 2018-08-15 DIAGNOSIS — C50912 Malignant neoplasm of unspecified site of left female breast: Secondary | ICD-10-CM | POA: Diagnosis not present

## 2018-08-15 DIAGNOSIS — I951 Orthostatic hypotension: Secondary | ICD-10-CM | POA: Diagnosis not present

## 2018-08-15 DIAGNOSIS — Z9221 Personal history of antineoplastic chemotherapy: Secondary | ICD-10-CM | POA: Diagnosis not present

## 2018-08-15 DIAGNOSIS — Z79899 Other long term (current) drug therapy: Secondary | ICD-10-CM | POA: Insufficient documentation

## 2018-08-15 DIAGNOSIS — Z9012 Acquired absence of left breast and nipple: Secondary | ICD-10-CM | POA: Insufficient documentation

## 2018-08-15 DIAGNOSIS — Z8249 Family history of ischemic heart disease and other diseases of the circulatory system: Secondary | ICD-10-CM | POA: Insufficient documentation

## 2018-08-15 DIAGNOSIS — R079 Chest pain, unspecified: Secondary | ICD-10-CM | POA: Diagnosis not present

## 2018-08-15 DIAGNOSIS — Z7982 Long term (current) use of aspirin: Secondary | ICD-10-CM | POA: Diagnosis not present

## 2018-08-15 DIAGNOSIS — Z803 Family history of malignant neoplasm of breast: Secondary | ICD-10-CM | POA: Insufficient documentation

## 2018-08-15 DIAGNOSIS — I5022 Chronic systolic (congestive) heart failure: Secondary | ICD-10-CM | POA: Insufficient documentation

## 2018-08-15 DIAGNOSIS — Z87891 Personal history of nicotine dependence: Secondary | ICD-10-CM | POA: Diagnosis not present

## 2018-08-15 DIAGNOSIS — I255 Ischemic cardiomyopathy: Secondary | ICD-10-CM | POA: Diagnosis not present

## 2018-08-15 DIAGNOSIS — Z853 Personal history of malignant neoplasm of breast: Secondary | ICD-10-CM | POA: Insufficient documentation

## 2018-08-15 DIAGNOSIS — I2510 Atherosclerotic heart disease of native coronary artery without angina pectoris: Secondary | ICD-10-CM | POA: Diagnosis not present

## 2018-08-15 DIAGNOSIS — R918 Other nonspecific abnormal finding of lung field: Secondary | ICD-10-CM | POA: Insufficient documentation

## 2018-08-15 NOTE — Progress Notes (Signed)
  Echocardiogram 2D Echocardiogram has been performed.  Robin Rose 08/15/2018, 2:42 PM

## 2018-08-15 NOTE — Patient Instructions (Signed)
Please give [place] a call in 2-3 months in order to make your appointment for October/December. [phone].  It was a please seeing you today.

## 2018-08-15 NOTE — Progress Notes (Signed)
Advanced Heart Failure Clinic Note    Referring Physician: Gwenlyn Found  Primary Care: Dr. Garlon Hatchet  Primary Cardiologist: Gwenlyn Found   HPI:  Robin Rose is a 63 y.o. female  with h/o tobacco abuse, breast cancer (s/p chemo and left mastectomy) and recently diagnosed systolic HF.    In 8/17 she was diagnosed with triple-negative breast cancer  with lymph node involvement. She had a clinically staged T4b, N1, M0 cancer which was stage IIIB. Her tumor was ER negative, PR negative and Her-2/neu negative. Her Ki67 was 30%. She underwent neoadjuvant chemotherapy including adriamycin (6 cycles)  followed by left mastectomy in 2/18 and XRT (completed 4/18).   Her EF 11/05/15 was 55% and by MUGA 04/06/16 was 69%. She was admitted to [facility] 08/02/16  with orthopnea and heart failure. She was diuresed 8 pounds. Her EF was 20-25% by echo on 08/03/16. RV was said to be normal. She saw Dr. Gwenlyn Found on 08/19/16. She was started on low-dose carvedilol. Lisinopril and lasix stopped by Dr. Garlon Hatchet on 08/15/16 due to low BP.   Admitted 1/1 with chest pain and shortness of breath. Had LHC/RHC unchanged 3 vessel disease, Placed on milrinone but later weaned off. Weight was 90 pounds.   She presents today for regular follow up. She remains on midodrine. She feels good. Says she can do anything she wants to do as long as she goes slowly. Does all ADLs without problems. Can't walk far. Gets tired easily. No edema, orthopnea or PND. No dizziness. Hasn't taken lasix in a while. Had bloodwork at [facility] last week and was fine.   Echo today (08/15/18) EF ~25%    CPX 11/18 FVC 2.01 (66%)    FEV1 1.53 (64%)     FEV1/FVC 76 (96%)     MVV 63 (70%)   Resting HR: 88 Peak HR: 126  (79% age predicted max HR)  BP rest: 124/76 BP peak: 158/70 Peak VO2: 16.8 (67% predicted peak VO2) VE/VCO2 slope: 48 OUES: 0.71 Peak RER: 1.07 Ventilatory Threshold: 12.7 (50% predicted and 76% measured peak VO2) VE/MVV:  51% O2pulse: 7  (100% predicted O2pulse)   ECHO 11/2016 EF 20-25% grade I DD.   RHC/LHC 03/16/2017   Mid LAD-1 lesion is 40% stenosed.  Mid LAD-2 lesion is 95% stenosed.  Ost 2nd Diag to 2nd Diag lesion is 50% stenosed.  Ost Cx to Prox Cx lesion is 60% stenosed.  1st Mrg lesion is 100% stenosed.  Prox RCA lesion is 90% stenosed.  Mid RCA lesion is 30% stenosed.  Post Atrio lesion is 100% stenosed.  Dist RCA lesion is 100% stenosed.  Mid Cx lesion is 95% stenosed.  Dist Cx lesion is 60% stenosed.  Findings: Ao = 142/80 (105) LV = 158/25 RA = 1 RV = 29/2 PA = 30/19 (20) PCW = 20 Fick cardiac output/index = 2.4/1.7 PVR = 5.0 WU SVR = 3452  FA sat = 97% PA sat = 57%, 58% Assessment: 1. Severe 3v CAD unchanged from previous. No obvious culprit lesion for NSTEMI 2. Severe ischemic CM with EF 15% and low output physiology   RHC/LHC 11/07/2016   Prox RCA lesion, 90 %stenosed.  Post Atrio lesion, 100 %stenosed.  Mid RCA lesion, 30 %stenosed.  Ost Cx to Prox Cx lesion, 80 %stenosed.  1st Mrg lesion, 100 %stenosed.  Dist Cx lesion, 90 %stenosed.  Dist LAD lesion, 80 %stenosed.  Mid LAD-2 lesion, 70 %stenosed.  Mid LAD-1 lesion, 40 %stenosed.  Ost 2nd Diag to  2nd Diag lesion, 99 %stenosed.  Ao = 135/72 (97) LV = 135/15 RA = 1 RV = 28/3 PA = 29/8 (17) PCW = 5 Fick cardiac output/index = 2.6/1.8 Thermo CO/CI = 7.0/5.0 PVR = 4.8 WU FA sat = 92% PA sat = 58%, 60%   PMHx:  . Allergic rhinitis 08/15/2016  . Chronic systolic heart failure ([place]) 08/15/2016  . Malignant neoplasm of overlapping sites of left female breast ([place]) 10/19/2015      Current Outpatient Medications  Medication Sig Dispense Refill  . acetaminophen (TYLENOL) 500 MG tablet Take 1,000 mg by mouth daily as needed for moderate pain or headache.     Marland Kitchen aspirin 81 MG chewable tablet Chew 1 tablet (81 mg total) by mouth daily. 90 tablet 4  . CORLANOR 5 MG TABS tablet TAKE 1  TABLET (5 MG TOTAL) BY MOUTH 2 (TWO) TIMES DAILY WITH A MEAL. 60 tablet 6  . digoxin (LANOXIN) 0.125 MG tablet Take 0.5 tablets (0.0625 mg total) by mouth daily. 45 tablet 3  . diphenhydrAMINE (BENADRYL) 25 mg capsule Take 25 mg by mouth every 6 (six) hours as needed for allergies.    . furosemide (LASIX) 20 MG tablet Take 20 mg by mouth 2 (two) times daily as needed for fluid or edema.     . midodrine (PROAMATINE) 5 MG tablet TAKE 1 TABLET BY MOUTH 3 TIMES DAILY WITH MEALS. 90 tablet 6  . nitroGLYCERIN (NITROSTAT) 0.4 MG SL tablet PLACE 1 TABLET (0.4 MG TOTAL) UNDER THE TONGUE EVERY 5 (FIVE) MINUTES AS NEEDED FOR CHEST PAIN. 25 tablet 3  . ondansetron (ZOFRAN) 4 MG tablet Take 4 mg by mouth every 4 (four) hours as needed for nausea/vomiting.  2  . oxymetazoline (AFRIN) 0.05 % nasal spray Place 1 spray into both nostrils at bedtime as needed for congestion.    . potassium chloride (K-DUR,KLOR-CON) 10 MEQ tablet Take 40 mEq by mouth daily as needed (swelling). With first dose of furosemide    . Probiotic Product (PROBIOTIC ADVANCED PO) Take 1 capsule by mouth daily.      No current facility-administered medications for this encounter.     Allergies  Allergen Reactions  . Chantix [Varenicline] Nausea And Vomiting  . Codeine Other (See Comments)    GI Upset, headaches        Social History   Socioeconomic History  . Marital status: Divorced    Spouse name: Not on file  . Number of children: Not on file  . Years of education: Not on file  . Highest education level: Not on file  Occupational History  . Not on file  Social Needs  . Financial resource strain: Not on file  . Food insecurity:    Worry: Not on file    Inability: Not on file  . Transportation needs:    Medical: Not on file    Non-medical: Not on file  Tobacco Use  . Smoking status: Former Smoker    Packs/day: 1.00    Years: 38.00    Pack years: 38.00    Types: Cigarettes    Last attempt to quit: 11/23/2016     Years since quitting: 1.7  . Smokeless tobacco: Never Used  Substance and Sexual Activity  . Alcohol use: No    Frequency: Never  . Drug use: No  . Sexual activity: Not Currently  Lifestyle  . Physical activity:    Days per week: Not on file    Minutes per session: Not on file  .  Stress: Not on file  Relationships  . Social connections:    Talks on phone: Not on file    Gets together: Not on file    Attends religious service: Not on file    Active member of club or organization: Not on file    Attends meetings of clubs or organizations: Not on file    Relationship status: Not on file  . Intimate partner violence:    Fear of current or ex partner: Not on file    Emotionally abused: Not on file    Physically abused: Not on file    Forced sexual activity: Not on file  Other Topics Concern  . Not on file  Social History Narrative  . Not on file   Family History   . Heart disease Mother - + CAD with CABG at age 53 . Kidney cancer Mother  . Heart disease Father  - CABG and AVR in 1s . Breast cancer Paternal Aunt  . Breast cancer Cousin  . Colon cancer Neg Hx    - 3 sisters - no CAD   Vitals:   08/15/18 1503  BP: 124/68  Pulse: 89  SpO2: 98%  Weight: 46.8 kg (103 lb 2 oz)  Height: _0  (1.6 m)   Wt Readings from Last 3 Encounters:  08/15/18 46.8 kg (103 lb 2 oz)  01/31/18 45.1 kg (99 lb 6.4 oz)  10/04/17 43.3 kg (95 lb 6.4 oz)    PHYSICAL EXAM: General:  Thin. Well appearing. No resp difficulty HEENT: normal Neck: supple. no JVD. Carotids 2+ bilat; no bruits. No lymphadenopathy or thryomegaly appreciated. Cor: PMI laterally displaced. Regular rate & rhythm. 2/6 AS Lungs: clear Abdomen: soft, nontender, nondistended. No hepatosplenomegaly. No bruits or masses. Good bowel sounds. Extremities: no cyanosis, clubbing, rash, edema Neuro: alert & orientedx3, cranial nerves grossly intact. moves all 4 extremities w/o difficulty. Affect pleasant   ASSESSMENT & PLAN:  1. Chronic systolic HF - 04/252 .  ECHO EF 20-25%.  - 08/15/18  Echo EF 20-25% - LHC with severe 3 vessel disease.  - Poor candidate for CABG or PCI due to diffuse CAD - No bb with low output.  - Off losartan due to hypotension  - Off lasix and spiro due to volume depletion - On digoxin  - Continue corlanor 5 mg BID.  - Overall doing better today. NYHA III. Volume status looks good with sliding scale lasix. BP too low to add back HF meds.  - CPX reveals moderate to severe HF limitation with very high VEVCO2 slope. Spirometry not too bad. We had long talk about her situation. Not transplant candidate with severe PAD and recent breast CA. - Failed milrinone attempt in 1/19 due to worsening tachycardia - Not candidate for advanced therapies and has failed inotropes. We offered her an evaluation at [facility] for 2nd opinion but she has declined. - f/u 3 months with echo 2. Presyncope - No further dizziness since staring on midodrine 5 mg TID.  - Event monitor ok - Encouraged salt and fluid intake.  3. 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 S/p Adjuvant chemotherapy followed by left mastectomy and LN dissection 2/18 and XRT. Now complete. - - No change to current plan.   4. Tobacco use - Has stopped.  5. Pulmonary Nodules - saw Dr Vaughan Browner. CTwith scattered nodules. Thought to be non specific inflammation.  - Plan for CT 12/2017. (last done 12/2016)   6. CAD - s/p NSTEM 1/19/ LHC 03/2017  Severe  3v CAD unchanged from previous. No obvious culprit lesion for NSTEMI - No s/s ischemia    - continue ASA/statin - Continue prn NTG.  7. Orthostatic hypotension - resolved with midodrine   [name], MD  3:23 PM

## 2018-09-18 NOTE — Telephone Encounter (Signed)
Closed

## 2018-10-09 DIAGNOSIS — Z1231 Encounter for screening mammogram for malignant neoplasm of breast: Secondary | ICD-10-CM | POA: Diagnosis not present

## 2018-10-23 ENCOUNTER — Other Ambulatory Visit (HOSPITAL_COMMUNITY): Payer: Self-pay

## 2018-10-23 MED ORDER — DIGOXIN 125 MCG PO TABS: 0.0625 mg | ORAL_TABLET | Freq: Every day | ORAL | 3 refills | Status: DC

## 2018-10-31 ENCOUNTER — Other Ambulatory Visit (HOSPITAL_COMMUNITY): Payer: Self-pay | Admitting: *Deleted

## 2018-10-31 MED ORDER — DIGOXIN 125 MCG PO TABS: 0.0625 mg | ORAL_TABLET | Freq: Every day | ORAL | 3 refills | Status: DC

## 2018-11-20 ENCOUNTER — Other Ambulatory Visit (HOSPITAL_COMMUNITY): Payer: Self-pay | Admitting: *Deleted

## 2018-12-13 ENCOUNTER — Encounter (HOSPITAL_COMMUNITY): Payer: Self-pay | Admitting: Internal Medicine

## 2018-12-13 ENCOUNTER — Other Ambulatory Visit: Payer: Self-pay

## 2018-12-13 ENCOUNTER — Ambulatory Visit (HOSPITAL_COMMUNITY)
Admission: RE | Admit: 2018-12-13 | Discharge: 2018-12-13 | Disposition: A | Discharge status: Home / Self Care | Payer: Medicare Other | Source: Ambulatory Visit | Attending: Internal Medicine | Admitting: Internal Medicine

## 2018-12-13 VITALS — BP 130/81 | HR 72 | Wt 104.0 lb

## 2018-12-13 DIAGNOSIS — Z853 Personal history of malignant neoplasm of breast: Secondary | ICD-10-CM | POA: Diagnosis not present

## 2018-12-13 DIAGNOSIS — Z79899 Other long term (current) drug therapy: Secondary | ICD-10-CM | POA: Diagnosis not present

## 2018-12-13 DIAGNOSIS — Z9012 Acquired absence of left breast and nipple: Secondary | ICD-10-CM | POA: Diagnosis not present

## 2018-12-13 DIAGNOSIS — I5022 Chronic systolic (congestive) heart failure: Secondary | ICD-10-CM | POA: Diagnosis not present

## 2018-12-13 DIAGNOSIS — Z9221 Personal history of antineoplastic chemotherapy: Secondary | ICD-10-CM | POA: Insufficient documentation

## 2018-12-13 DIAGNOSIS — R918 Other nonspecific abnormal finding of lung field: Secondary | ICD-10-CM | POA: Insufficient documentation

## 2018-12-13 DIAGNOSIS — Z7982 Long term (current) use of aspirin: Secondary | ICD-10-CM | POA: Insufficient documentation

## 2018-12-13 DIAGNOSIS — F5101 Primary insomnia: Secondary | ICD-10-CM

## 2018-12-13 DIAGNOSIS — Z885 Allergy status to narcotic agent status: Secondary | ICD-10-CM | POA: Diagnosis not present

## 2018-12-13 DIAGNOSIS — I2510 Atherosclerotic heart disease of native coronary artery without angina pectoris: Secondary | ICD-10-CM | POA: Insufficient documentation

## 2018-12-13 DIAGNOSIS — Z720 Tobacco use: Secondary | ICD-10-CM | POA: Diagnosis not present

## 2018-12-13 DIAGNOSIS — G4700 Insomnia, unspecified: Secondary | ICD-10-CM | POA: Insufficient documentation

## 2018-12-13 DIAGNOSIS — F17210 Nicotine dependence, cigarettes, uncomplicated: Secondary | ICD-10-CM | POA: Diagnosis not present

## 2018-12-13 MED ORDER — TRAZODONE HCL 50 MG PO TABS: 50.0000 mg | ORAL_TABLET | Freq: Every day | ORAL | 0 refills | Status: DC

## 2018-12-13 NOTE — Progress Notes (Signed)
Advanced Heart Failure Clinic Note    Referring Physician: Gwenlyn Found  Primary Care: Dr. Garlon Hatchet  Primary Cardiologist: Gwenlyn Found   HPI:  Robin Rose is a 63 y.o. female  with h/o tobacco abuse, breast cancer (s/p chemo and left mastectomy) and recently diagnosed systolic HF.    In 8/17 she was diagnosed with triple-negative breast cancer  with lymph node involvement. She had a clinically staged T4b, N1, M0 cancer which was stage IIIB. Her tumor was ER negative, PR negative and Her-2/neu negative. Her Ki67 was 30%. She underwent neoadjuvant chemotherapy including adriamycin (6 cycles)  followed by left mastectomy in 2/18 and XRT (completed 4/18).   Her EF 11/05/15 was 55% and by MUGA 04/06/16 was 69%. She was admitted to [facility] 08/02/16  with orthopnea and heart failure. She was diuresed 8 pounds. Her EF was 20-25% by echo on 08/03/16. RV was said to be normal. She saw Dr. Gwenlyn Found on 08/19/16. She was started on low-dose carvedilol. Lisinopril and lasix stopped by Dr. Garlon Hatchet on 08/15/16 due to low BP.   Admitted 1/1 with chest pain and shortness of breath. Had LHC/RHC unchanged 3 vessel disease, Placed on milrinone but later weaned off. Weight was 90 pounds.   She presents today for regular follow up. She remains on midodrine. She feels good. Able to do all ADLs without problem. No CP or SOB. No edema, orthopnea or PND. Says she is not sleeping well. Still smoking a few cigarettes per day.  Echo (08/15/18) EF ~25%   Cardiac studies:  CPX 11/18 FVC 2.01 (66%)    FEV1 1.53 (64%)     FEV1/FVC 76 (96%)     MVV 63 (70%)   Resting HR: 88 Peak HR: 126  (79% age predicted max HR)  BP rest: 124/76 BP peak: 158/70 Peak VO2: 16.8 (67% predicted peak VO2) VE/VCO2 slope: 48 OUES: 0.71 Peak RER: 1.07 Ventilatory Threshold: 12.7 (50% predicted and 76% measured peak VO2) VE/MVV: 51% O2pulse: 7  (100% predicted O2pulse)   ECHO 11/2016 EF 20-25% grade I DD.   RHC/LHC 03/16/2017    Mid LAD-1 lesion is 40% stenosed.  Mid LAD-2 lesion is 95% stenosed.  Ost 2nd Diag to 2nd Diag lesion is 50% stenosed.  Ost Cx to Prox Cx lesion is 60% stenosed.  1st Mrg lesion is 100% stenosed.  Prox RCA lesion is 90% stenosed.  Mid RCA lesion is 30% stenosed.  Post Atrio lesion is 100% stenosed.  Dist RCA lesion is 100% stenosed.  Mid Cx lesion is 95% stenosed.  Dist Cx lesion is 60% stenosed.  Findings: Ao = 142/80 (105) LV = 158/25 RA = 1 RV = 29/2 PA = 30/19 (20) PCW = 20 Fick cardiac output/index = 2.4/1.7 PVR = 5.0 WU SVR = 3452  FA sat = 97% PA sat = 57%, 58% Assessment: 1. Severe 3v CAD unchanged from previous. No obvious culprit lesion for NSTEMI 2. Severe ischemic CM with EF 15% and low output physiology   RHC/LHC 11/07/2016   Prox RCA lesion, 90 %stenosed.  Post Atrio lesion, 100 %stenosed.  Mid RCA lesion, 30 %stenosed.  Ost Cx to Prox Cx lesion, 80 %stenosed.  1st Mrg lesion, 100 %stenosed.  Dist Cx lesion, 90 %stenosed.  Dist LAD lesion, 80 %stenosed.  Mid LAD-2 lesion, 70 %stenosed.  Mid LAD-1 lesion, 40 %stenosed.  Ost 2nd Diag to 2nd Diag lesion, 99 %stenosed.  Ao = 135/72 (97) LV = 135/15 RA = 1 RV = 28/3 PA =  29/8 (17) PCW = 5 Fick cardiac output/index = 2.6/1.8 Thermo CO/CI = 7.0/5.0 PVR = 4.8 WU FA sat = 92% PA sat = 58%, 60%   PMHx:  . Allergic rhinitis 08/15/2016  . Chronic systolic heart failure ([place]) 08/15/2016  . Malignant neoplasm of overlapping sites of left female breast ([place]) 10/19/2015      Current Outpatient Medications  Medication Sig Dispense Refill  . acetaminophen (TYLENOL) 500 MG tablet Take 1,000 mg by mouth daily as needed for moderate pain or headache.     Marland Kitchen aspirin 81 MG chewable tablet Chew 1 tablet (81 mg total) by mouth daily. 90 tablet 4  . CORLANOR 5 MG TABS tablet TAKE 1 TABLET (5 MG TOTAL) BY MOUTH 2 (TWO) TIMES DAILY WITH A MEAL. 60 tablet 6  . digoxin (LANOXIN) 0.125 MG tablet  Take 0.5 tablets (0.0625 mg total) by mouth daily. 45 tablet 3  . diphenhydrAMINE (BENADRYL) 25 mg capsule Take 25 mg by mouth every 6 (six) hours as needed for allergies.    . furosemide (LASIX) 20 MG tablet Take 20 mg by mouth 2 (two) times daily as needed for fluid or edema.     . midodrine (PROAMATINE) 5 MG tablet TAKE 1 TABLET BY MOUTH 3 TIMES DAILY WITH MEALS. 90 tablet 6  . nitroGLYCERIN (NITROSTAT) 0.4 MG SL tablet PLACE 1 TABLET (0.4 MG TOTAL) UNDER THE TONGUE EVERY 5 (FIVE) MINUTES AS NEEDED FOR CHEST PAIN. 25 tablet 3  . ondansetron (ZOFRAN) 4 MG tablet Take 4 mg by mouth every 4 (four) hours as needed for nausea/vomiting.  2  . oxymetazoline (AFRIN) 0.05 % nasal spray Place 1 spray into both nostrils at bedtime as needed for congestion.    . potassium chloride (K-DUR,KLOR-CON) 10 MEQ tablet Take 40 mEq by mouth daily as needed (swelling). With first dose of furosemide    . Probiotic Product (PROBIOTIC ADVANCED PO) Take 1 capsule by mouth daily.      No current facility-administered medications for this encounter.     Allergies  Allergen Reactions  . Chantix [Varenicline] Nausea And Vomiting  . Codeine Other (See Comments)    GI Upset, headaches        Social History   Socioeconomic History  . Marital status: Divorced    Spouse name: Not on file  . Number of children: Not on file  . Years of education: Not on file  . Highest education level: Not on file  Occupational History  . Not on file  Social Needs  . Financial resource strain: Not on file  . Food insecurity    Worry: Not on file    Inability: Not on file  . Transportation needs    Medical: Not on file    Non-medical: Not on file  Tobacco Use  . Smoking status: Former Smoker    Packs/day: 1.00    Years: 38.00    Pack years: 38.00    Types: Cigarettes    Quit date: 11/23/2016    Years since quitting: 2.0  . Smokeless tobacco: Never Used  Substance and Sexual Activity  . Alcohol use: No    Frequency:  Never  . Drug use: No  . Sexual activity: Not Currently  Lifestyle  . Physical activity    Days per week: Not on file    Minutes per session: Not on file  . Stress: Not on file  Relationships  . Social connections    Talks on phone: Not on file  Gets together: Not on file    Attends religious service: Not on file    Active member of club or organization: Not on file    Attends meetings of clubs or organizations: Not on file    Relationship status: Not on file  . Intimate partner violence    Fear of current or ex partner: Not on file    Emotionally abused: Not on file    Physically abused: Not on file    Forced sexual activity: Not on file  Other Topics Concern  . Not on file  Social History Narrative  . Not on file   Family History   . Heart disease Mother - + CAD with CABG at age 29 . Kidney cancer Mother  . Heart disease Father  - CABG and AVR in 49s . Breast cancer Paternal Aunt  . Breast cancer Cousin  . Colon cancer Neg Hx    - 3 sisters - no CAD   Vitals:   12/13/18 1105  BP: 130/81  Pulse: 72  SpO2: 97%  Weight: 47.2 kg (104 lb)   Wt Readings from Last 3 Encounters:  12/13/18 47.2 kg (104 lb)  08/15/18 46.8 kg (103 lb 2 oz)  01/31/18 45.1 kg (99 lb 6.4 oz)    PHYSICAL EXAM: General:  Very thin . No resp difficulty HEENT: normal Neck: supple. no JVD. Carotids 2+ bilat; + bruits. No lymphadenopathy or thryomegaly appreciated. Cor: PMI nondisplaced. Regular rate & rhythm. 2/6 AS Lungs: clear with decreased BS Abdomen: soft, nontender, nondistended. No hepatosplenomegaly. No bruits or masses. Good bowel sounds. Extremities: no cyanosis, clubbing, rash, edema Neuro: alert & orientedx3, cranial nerves grossly intact. moves all 4 extremities w/o difficulty. Affect pleasant   ASSESSMENT & PLAN: 1. Chronic systolic HF - 10/5275 .  ECHO EF 20-25%.  - 08/15/18  Echo EF 20-25% - LHC with severe 3 vessel disease. Poor candidate for CABG or PCI due to diffuse  CAD - No bb with low output.  - Off losartan due to hypotension  - Off lasix and spiro due to volume depletion - On digoxin  - Continue corlanor 5 mg BID.  - Overall doing better. Stable NYHA III.  - Volume status looks good off lasix - CPX reveals moderate to severe HF limitation with very high VEVCO2 slope. Spirometry not too bad. We had long talk about her situation. Not transplant candidate with severe PAD and breast CA. - Failed milrinone attempt in 1/19 due to worsening tachycardia - Not candidate for advanced therapies and has failed inotropes. We offered her an evaluation at [facility] for 2nd opinion but she has declined. - f/u 3 months with echo 2.Orthostatic hypotension - resolved with midodrine 3. 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 S/p Adjuvant chemotherapy followed by left mastectomy and LN dissection 2/18 and XRT. Now complete. - - No change to current plan.   4. Tobacco use - Continues to smoke a few cigarettes per day 5. Pulmonary Nodules - saw Dr Vaughan Browner. CTwith scattered nodules. Thought to be non specific inflammation.  - Plan for CT 12/2017. (last done 12/2016)   6. CAD - s/p NSTEM 1/19/ LHC 03/2017  Severe 3v CAD unchanged from previous. No obvious culprit lesion for NSTEMI - No s/s ischemia - continue ASA/statin - Continue prn NTG.  7. Insomnia -- tray trazodone 69m qhs prn  Overall doing well given severe HF and CAD. Continue current therapy.    DGlori Bickers MD  11:21 AM

## 2018-12-13 NOTE — Patient Instructions (Signed)
START Trazodone 50mg  (1 tab) nightly  Your physician recommends that you schedule a follow-up appointment in: 6 months with Dr Haroldine Laws.  You will get a call to schedule this appointment.   At the [facility], you and your health needs are our priority. As part of our continuing mission to provide you with exceptional heart care, we have created designated Provider Care Teams. These Care Teams include your primary Cardiologist (physician) and Advanced Practice Providers (APPs- Physician Assistants and Nurse Practitioners) who all work together to provide you with the care you need, when you need it.   You may see any of the following providers on your designated Care Team at your next follow up: Marland Kitchen Dr [name] . Dr Loralie Champagne . Darrick Grinder, NP   Please be sure to bring in all your medications bottles to every appointment.

## 2018-12-17 ENCOUNTER — Telehealth (HOSPITAL_COMMUNITY): Payer: Self-pay | Admitting: *Deleted

## 2018-12-17 NOTE — Telephone Encounter (Signed)
Pt aware and agreeable with plan.  

## 2018-12-17 NOTE — Telephone Encounter (Signed)
Pt called stating Dr.Bensimhon had prescribed her trazadone to help her sleep but its making her blood pressure very low the next morning. Pt said she gets very dizzy when she stands up the morning after taking trazadone. Pt stated this morning while sitting her bp was 130/80 when she stood up it dropped to 92/62. Pt requests return call from [place]  Routed to [place] for advice

## 2018-12-17 NOTE — Telephone Encounter (Signed)
Would stop trazodone and ask her PCP if they have any other suggestions for a sleep aid which may be gentler.

## 2018-12-28 DIAGNOSIS — Z23 Encounter for immunization: Secondary | ICD-10-CM | POA: Diagnosis not present

## 2019-01-29 DIAGNOSIS — I2510 Atherosclerotic heart disease of native coronary artery without angina pectoris: Secondary | ICD-10-CM | POA: Diagnosis not present

## 2019-01-29 DIAGNOSIS — I509 Heart failure, unspecified: Secondary | ICD-10-CM | POA: Diagnosis not present

## 2019-01-29 DIAGNOSIS — C50812 Malignant neoplasm of overlapping sites of left female breast: Secondary | ICD-10-CM | POA: Diagnosis not present

## 2019-02-12 ENCOUNTER — Other Ambulatory Visit (HOSPITAL_COMMUNITY): Payer: Self-pay

## 2019-02-12 MED ORDER — IVABRADINE HCL 5 MG PO TABS: ORAL_TABLET | ORAL | 6 refills | Status: DC

## 2019-02-13 ENCOUNTER — Encounter (HOSPITAL_COMMUNITY): Payer: Medicare Other | Admitting: Internal Medicine

## 2019-04-28 ENCOUNTER — Other Ambulatory Visit (HOSPITAL_COMMUNITY): Payer: Self-pay | Admitting: Adult Health

## 2019-05-30 ENCOUNTER — Telehealth (HOSPITAL_COMMUNITY): Payer: Self-pay | Admitting: Pharmacist

## 2019-05-30 NOTE — Telephone Encounter (Signed)
Advanced Heart Failure Patient Advocate Encounter  Prior Authorization for Corlanor has been approved.    PA# [id number] Effective dates: 04/30/2019 through 05/29/2020  Audry Riles, PharmD, BCPS, BCCP, CPP Heart Failure Clinic Pharmacist [phone]

## 2019-07-04 ENCOUNTER — Ambulatory Visit (HOSPITAL_COMMUNITY)
Admission: RE | Admit: 2019-07-04 | Discharge: 2019-07-04 | Disposition: A | Discharge status: Home / Self Care | Payer: Medicare Other | Source: Ambulatory Visit | Attending: Internal Medicine | Admitting: Internal Medicine

## 2019-07-04 ENCOUNTER — Encounter (HOSPITAL_COMMUNITY): Payer: Self-pay | Admitting: Internal Medicine

## 2019-07-04 ENCOUNTER — Other Ambulatory Visit: Payer: Self-pay

## 2019-07-04 VITALS — BP 132/80 | HR 83 | Wt 106.2 lb

## 2019-07-04 DIAGNOSIS — Z79899 Other long term (current) drug therapy: Secondary | ICD-10-CM | POA: Diagnosis not present

## 2019-07-04 DIAGNOSIS — Z803 Family history of malignant neoplasm of breast: Secondary | ICD-10-CM | POA: Insufficient documentation

## 2019-07-04 DIAGNOSIS — Z720 Tobacco use: Secondary | ICD-10-CM | POA: Diagnosis not present

## 2019-07-04 DIAGNOSIS — F17210 Nicotine dependence, cigarettes, uncomplicated: Secondary | ICD-10-CM | POA: Insufficient documentation

## 2019-07-04 DIAGNOSIS — I255 Ischemic cardiomyopathy: Secondary | ICD-10-CM | POA: Insufficient documentation

## 2019-07-04 DIAGNOSIS — I951 Orthostatic hypotension: Secondary | ICD-10-CM | POA: Insufficient documentation

## 2019-07-04 DIAGNOSIS — R918 Other nonspecific abnormal finding of lung field: Secondary | ICD-10-CM | POA: Insufficient documentation

## 2019-07-04 DIAGNOSIS — Z9221 Personal history of antineoplastic chemotherapy: Secondary | ICD-10-CM | POA: Insufficient documentation

## 2019-07-04 DIAGNOSIS — Z853 Personal history of malignant neoplasm of breast: Secondary | ICD-10-CM | POA: Insufficient documentation

## 2019-07-04 DIAGNOSIS — I5022 Chronic systolic (congestive) heart failure: Secondary | ICD-10-CM | POA: Insufficient documentation

## 2019-07-04 DIAGNOSIS — I2510 Atherosclerotic heart disease of native coronary artery without angina pectoris: Secondary | ICD-10-CM | POA: Diagnosis not present

## 2019-07-04 DIAGNOSIS — Z7982 Long term (current) use of aspirin: Secondary | ICD-10-CM | POA: Insufficient documentation

## 2019-07-04 DIAGNOSIS — Z8249 Family history of ischemic heart disease and other diseases of the circulatory system: Secondary | ICD-10-CM | POA: Insufficient documentation

## 2019-07-04 DIAGNOSIS — Z9012 Acquired absence of left breast and nipple: Secondary | ICD-10-CM | POA: Diagnosis not present

## 2019-07-04 MED ORDER — NITROGLYCERIN 0.4 MG SL SUBL: 0.4000 mg | SUBLINGUAL_TABLET | SUBLINGUAL | 3 refills | Status: DC | PRN

## 2019-07-04 NOTE — Patient Instructions (Signed)
Your physician recommends that you schedule a follow-up appointment in: 3 months with echocardiogram  If you have any questions or concerns before your next appointment please send [place] a message through [place] or call our office at [phone].  At the [facility], you and your health needs are our priority. As part of our continuing mission to provide you with exceptional heart care, we have created designated Provider Care Teams. These Care Teams include your primary Cardiologist (physician) and Advanced Practice Providers (APPs- Physician Assistants and Nurse Practitioners) who all work together to provide you with the care you need, when you need it.   You may see any of the following providers on your designated Care Team at your next follow up: Marland Kitchen Dr [name] . Dr Loralie Champagne . Darrick Grinder, NP . Lyda Jester, PA . Audry Riles, PharmD   Please be sure to bring in all your medications bottles to every appointment.

## 2019-07-04 NOTE — Progress Notes (Signed)
Advanced Heart Failure Clinic Note    Referring Physician: Gwenlyn Found  Primary Care: Dr. Garlon Hatchet  Primary Cardiologist: Gwenlyn Found   HPI: Robin Rose is a 64 y.o. female  with h/o tobacco abuse, breast cancer (s/p chemo and left mastectomy) and recently diagnosed systolic HF.    In 8/17 she was diagnosed with triple-negative breast cancer  with lymph node involvement. She had a clinically staged T4b, N1, M0 cancer which was stage IIIB. Her tumor was ER negative, PR negative and Her-2/neu negative. Her Ki67 was 30%. She underwent neoadjuvant chemotherapy including adriamycin (6 cycles)  followed by left mastectomy in 2/18 and XRT (completed 4/18).   Her EF 11/05/15 was 55% and by MUGA 04/06/16 was 69%. She was admitted to [facility] 08/02/16  with orthopnea and heart failure. She was diuresed 8 pounds. Her EF was 20-25% by echo on 08/03/16. RV was said to be normal. She saw Dr. Gwenlyn Found on 08/19/16. She was started on low-dose carvedilol. Lisinopril and lasix stopped by Dr. Garlon Hatchet on 08/15/16 due to low BP.   Admitted 1/1 with chest pain and shortness of breath. Had LHC/RHC unchanged 3 vessel disease, Placed on milrinone but later weaned off. Weight was 90 pounds.   Today she returns for HF follow up.Overall feeling fine. Not very active and rarely leaves her home. SOB with moderate exertion. Denies PND/Orthopnea. No chest pain. Appetite ok. No fever or chills. Weight at home 100-102 pounds. Continues to smoke 1/2 PPD. Taking all medications.   Echo (08/15/18) EF ~25%   Cardiac studies:  CPX 11/18 FVC 2.01 (66%)    FEV1 1.53 (64%)     FEV1/FVC 76 (96%)     MVV 63 (70%)   Resting HR: 88 Peak HR: 126  (79% age predicted max HR)  BP rest: 124/76 BP peak: 158/70 Peak VO2: 16.8 (67% predicted peak VO2) VE/VCO2 slope: 48 OUES: 0.71 Peak RER: 1.07 Ventilatory Threshold: 12.7 (50% predicted and 76% measured peak VO2) VE/MVV: 51% O2pulse: 7  (100% predicted O2pulse)   ECHO 11/2016  EF 20-25% grade I DD.   RHC/LHC 03/16/2017   Mid LAD-1 lesion is 40% stenosed.  Mid LAD-2 lesion is 95% stenosed.  Ost 2nd Diag to 2nd Diag lesion is 50% stenosed.  Ost Cx to Prox Cx lesion is 60% stenosed.  1st Mrg lesion is 100% stenosed.  Prox RCA lesion is 90% stenosed.  Mid RCA lesion is 30% stenosed.  Post Atrio lesion is 100% stenosed.  Dist RCA lesion is 100% stenosed.  Mid Cx lesion is 95% stenosed.  Dist Cx lesion is 60% stenosed.  Findings: Ao = 142/80 (105) LV = 158/25 RA = 1 RV = 29/2 PA = 30/19 (20) PCW = 20 Fick cardiac output/index = 2.4/1.7 PVR = 5.0 WU SVR = 3452  FA sat = 97% PA sat = 57%, 58% Assessment: 1. Severe 3v CAD unchanged from previous. No obvious culprit lesion for NSTEMI 2. Severe ischemic CM with EF 15% and low output physiology   RHC/LHC 11/07/2016   Prox RCA lesion, 90 %stenosed.  Post Atrio lesion, 100 %stenosed.  Mid RCA lesion, 30 %stenosed.  Ost Cx to Prox Cx lesion, 80 %stenosed.  1st Mrg lesion, 100 %stenosed.  Dist Cx lesion, 90 %stenosed.  Dist LAD lesion, 80 %stenosed.  Mid LAD-2 lesion, 70 %stenosed.  Mid LAD-1 lesion, 40 %stenosed.  Ost 2nd Diag to 2nd Diag lesion, 99 %stenosed.  Ao = 135/72 (97) LV = 135/15 RA = 1 RV =  28/3 PA = 29/8 (17) PCW = 5 Fick cardiac output/index = 2.6/1.8 Thermo CO/CI = 7.0/5.0 PVR = 4.8 WU FA sat = 92% PA sat = 58%, 60%   PMHx:  . Allergic rhinitis 08/15/2016  . Chronic systolic heart failure ([place]) 08/15/2016  . Malignant neoplasm of overlapping sites of left female breast ([place]) 10/19/2015      Current Outpatient Medications  Medication Sig Dispense Refill  . acetaminophen (TYLENOL) 500 MG tablet Take 1,000 mg by mouth daily as needed for moderate pain or headache.     Marland Kitchen aspirin 81 MG chewable tablet Chew 1 tablet (81 mg total) by mouth daily. 90 tablet 4  . digoxin (LANOXIN) 0.125 MG tablet Take 0.5 tablets (0.0625 mg total) by mouth daily. 45 tablet 3    . diphenhydrAMINE (BENADRYL) 25 mg capsule Take 25 mg by mouth every 6 (six) hours as needed for allergies.    . furosemide (LASIX) 20 MG tablet Take 20 mg by mouth 2 (two) times daily as needed for fluid or edema.     . ivabradine (CORLANOR) 5 MG TABS tablet TAKE 1 TABLET (5 MG TOTAL) BY MOUTH 2 (TWO) TIMES DAILY WITH A MEAL. 60 tablet 6  . midodrine (PROAMATINE) 5 MG tablet TAKE 1 TABLET BY MOUTH 3 TIMES DAILY WITH MEALS. 90 tablet 6  . ondansetron (ZOFRAN) 4 MG tablet Take 4 mg by mouth every 4 (four) hours as needed for nausea/vomiting.  2  . oxymetazoline (AFRIN) 0.05 % nasal spray Place 1 spray into both nostrils at bedtime as needed for congestion.    . potassium chloride (K-DUR,KLOR-CON) 10 MEQ tablet Take 40 mEq by mouth daily as needed (swelling). With first dose of furosemide    . Probiotic Product (PROBIOTIC ADVANCED PO) Take 1 capsule by mouth daily.     . nitroGLYCERIN (NITROSTAT) 0.4 MG SL tablet Place 1 tablet (0.4 mg total) under the tongue every 5 (five) minutes as needed for chest pain. 25 tablet 3   No current facility-administered medications for this encounter.    Allergies  Allergen Reactions  . Chantix [Varenicline] Nausea And Vomiting  . Codeine Other (See Comments)    GI Upset, headaches        Social History   Socioeconomic History  . Marital status: Divorced    Spouse name: Not on file  . Number of children: Not on file  . Years of education: Not on file  . Highest education level: Not on file  Occupational History  . Not on file  Tobacco Use  . Smoking status: Light Tobacco Smoker    Years: 38.00    Types: Cigarettes  . Smokeless tobacco: Never Used  Substance and Sexual Activity  . Alcohol use: No  . Drug use: No  . Sexual activity: Not Currently  Other Topics Concern  . Not on file  Social History Narrative  . Not on file   Social Determinants of Health   Financial Resource Strain:   . Difficulty of Paying Living Expenses:   Food  Insecurity:   . Worried About Charity fundraiser in the Last Year:   . Arboriculturist in the Last Year:   Transportation Needs:   . Film/video editor (Medical):   Marland Kitchen Lack of Transportation (Non-Medical):   Physical Activity:   . Days of Exercise per Week:   . Minutes of Exercise per Session:   Stress:   . Feeling of Stress :   Social Connections:   .  Frequency of Communication with Friends and Family:   . Frequency of Social Gatherings with Friends and Family:   . Attends Religious Services:   . Active Member of Clubs or Organizations:   . Attends Archivist Meetings:   Marland Kitchen Marital Status:   Intimate Partner Violence:   . Fear of Current or Ex-Partner:   . Emotionally Abused:   Marland Kitchen Physically Abused:   . Sexually Abused:    Family History   . Heart disease Mother - + CAD with CABG at age 81 . Kidney cancer Mother  . Heart disease Father  - CABG and AVR in 30s . Breast cancer Paternal Aunt  . Breast cancer Cousin  . Colon cancer Neg Hx    - 3 sisters - no CAD   Vitals:   07/04/19 1146  BP: 132/80  Pulse: 83  SpO2: 97%  Weight: 48.2 kg (106 lb 3.2 oz)   Wt Readings from Last 3 Encounters:  07/04/19 48.2 kg (106 lb 3.2 oz)  12/13/18 47.2 kg (104 lb)  08/15/18 46.8 kg (103 lb 2 oz)    PHYSICAL EXAM: General:  Well appearing. No resp difficulty HEENT: normal Neck: supple. no JVD. Carotids 2+ bilat; no bruits. No lymphadenopathy or thryomegaly appreciated. Cor: PMI nondisplaced. Regular rate & rhythm. No rubs, gallops or murmurs. Lungs: clear Abdomen: soft, nontender, nondistended. No hepatosplenomegaly. No bruits or masses. Good bowel sounds. Extremities: no cyanosis, clubbing, rash, edema Neuro: alert & orientedx3, cranial nerves grossly intact. moves all 4 extremities w/o difficulty. Affect pleasant   ASSESSMENT & PLAN: 1. Chronic systolic HF - 04/7251 .  ECHO EF 20-25%.  - 08/15/18  Echo EF 20-25% - LHC with severe 3 vessel disease. Poor candidate  for CABG or PCI due to diffuse CAD -NYHA II-III. Volume status stable. Does not need lasix.  - No bb with low output.  - Off losartan due to hypotension  - Off lasix and spiro due to volume depletion - On digoxin  - Continue corlanor 5 mg BID.  - CPX reveals moderate to severe HF limitation with very high VEVCO2 slope. Spirometry not too bad. We had long talk about her situation. Not transplant candidate with severe PAD and breast CA. - Failed milrinone attempt in 1/19 due to worsening tachycardia - Not candidate for advanced therapies and has failed inotropes. We offered her an evaluation at [facility] for 2nd opinion but she has declined. 2.Orthostatic hypotension -Stable.  3. 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 S/p Adjuvant chemotherapy followed by left mastectomy and LN dissection 2/18 and XRT. Now complete. - - No change to current plan.   4. Tobacco use - Discussed smoking cessation.  5. Pulmonary Nodules - saw Dr Vaughan Browner. CTwith scattered nodules. Thought to be non specific inflammation.  - Plan for CT 12/2017. (last done 12/2016)   6. CAD - s/p NSTEM 1/19/ LHC 03/2017  Severe 3v CAD unchanged from previous. No obvious culprit lesion for NSTEMI - No chest pain.  - continue ASA/statin - Continue prn NTG.  7. Insomnia -- tray trazodone 16m qhs prn  Follow up with in 3 months.    ADarrick Grinder NP  12:35 PM

## 2019-07-29 DIAGNOSIS — Z853 Personal history of malignant neoplasm of breast: Secondary | ICD-10-CM | POA: Diagnosis not present

## 2019-07-29 DIAGNOSIS — I890 Lymphedema, not elsewhere classified: Secondary | ICD-10-CM | POA: Diagnosis not present

## 2019-07-29 DIAGNOSIS — I2510 Atherosclerotic heart disease of native coronary artery without angina pectoris: Secondary | ICD-10-CM | POA: Diagnosis not present

## 2019-07-29 DIAGNOSIS — C50812 Malignant neoplasm of overlapping sites of left female breast: Secondary | ICD-10-CM | POA: Diagnosis not present

## 2019-09-27 ENCOUNTER — Other Ambulatory Visit (HOSPITAL_COMMUNITY): Payer: Self-pay | Admitting: Internal Medicine

## 2019-10-03 ENCOUNTER — Ambulatory Visit (HOSPITAL_COMMUNITY)
Admission: RE | Admit: 2019-10-03 | Discharge: 2019-10-03 | Disposition: A | Discharge status: Home / Self Care | Payer: Medicare Other | Source: Ambulatory Visit | Attending: Internal Medicine | Admitting: Internal Medicine

## 2019-10-03 ENCOUNTER — Ambulatory Visit (HOSPITAL_BASED_OUTPATIENT_CLINIC_OR_DEPARTMENT_OTHER)
Admission: RE | Admit: 2019-10-03 | Discharge: 2019-10-03 | Disposition: A | Discharge status: Home / Self Care | Payer: Medicare Other | Source: Ambulatory Visit | Attending: Internal Medicine | Admitting: Internal Medicine

## 2019-10-03 ENCOUNTER — Other Ambulatory Visit: Payer: Self-pay

## 2019-10-03 ENCOUNTER — Encounter (HOSPITAL_COMMUNITY): Payer: Self-pay | Admitting: Internal Medicine

## 2019-10-03 VITALS — BP 138/76 | HR 102 | Wt 107.0 lb

## 2019-10-03 DIAGNOSIS — Z720 Tobacco use: Secondary | ICD-10-CM | POA: Diagnosis not present

## 2019-10-03 DIAGNOSIS — I5022 Chronic systolic (congestive) heart failure: Secondary | ICD-10-CM | POA: Diagnosis not present

## 2019-10-03 DIAGNOSIS — I255 Ischemic cardiomyopathy: Secondary | ICD-10-CM | POA: Diagnosis not present

## 2019-10-03 DIAGNOSIS — Z803 Family history of malignant neoplasm of breast: Secondary | ICD-10-CM | POA: Diagnosis not present

## 2019-10-03 DIAGNOSIS — Z79899 Other long term (current) drug therapy: Secondary | ICD-10-CM | POA: Diagnosis not present

## 2019-10-03 DIAGNOSIS — I951 Orthostatic hypotension: Secondary | ICD-10-CM

## 2019-10-03 DIAGNOSIS — F17210 Nicotine dependence, cigarettes, uncomplicated: Secondary | ICD-10-CM | POA: Insufficient documentation

## 2019-10-03 DIAGNOSIS — Z853 Personal history of malignant neoplasm of breast: Secondary | ICD-10-CM | POA: Diagnosis not present

## 2019-10-03 DIAGNOSIS — R000 Tachycardia, unspecified: Secondary | ICD-10-CM | POA: Insufficient documentation

## 2019-10-03 DIAGNOSIS — Z7982 Long term (current) use of aspirin: Secondary | ICD-10-CM | POA: Diagnosis not present

## 2019-10-03 DIAGNOSIS — J309 Allergic rhinitis, unspecified: Secondary | ICD-10-CM | POA: Diagnosis not present

## 2019-10-03 DIAGNOSIS — Z885 Allergy status to narcotic agent status: Secondary | ICD-10-CM | POA: Diagnosis not present

## 2019-10-03 DIAGNOSIS — I2510 Atherosclerotic heart disease of native coronary artery without angina pectoris: Secondary | ICD-10-CM | POA: Diagnosis not present

## 2019-10-03 DIAGNOSIS — I252 Old myocardial infarction: Secondary | ICD-10-CM | POA: Diagnosis not present

## 2019-10-03 DIAGNOSIS — Z9012 Acquired absence of left breast and nipple: Secondary | ICD-10-CM | POA: Insufficient documentation

## 2019-10-03 DIAGNOSIS — I739 Peripheral vascular disease, unspecified: Secondary | ICD-10-CM | POA: Insufficient documentation

## 2019-10-03 DIAGNOSIS — Z8249 Family history of ischemic heart disease and other diseases of the circulatory system: Secondary | ICD-10-CM | POA: Diagnosis not present

## 2019-10-03 DIAGNOSIS — Z9221 Personal history of antineoplastic chemotherapy: Secondary | ICD-10-CM | POA: Diagnosis not present

## 2019-10-03 DIAGNOSIS — Z888 Allergy status to other drugs, medicaments and biological substances status: Secondary | ICD-10-CM | POA: Diagnosis not present

## 2019-10-03 DIAGNOSIS — R918 Other nonspecific abnormal finding of lung field: Secondary | ICD-10-CM | POA: Insufficient documentation

## 2019-10-03 LAB — BASIC METABOLIC PANEL
Anion gap: 11 (ref 5–15)
BUN: 8 mg/dL (ref 8–23)
CO2: 23 mmol/L (ref 22–32)
Calcium: 9.3 mg/dL (ref 8.9–10.3)
Chloride: 104 mmol/L (ref 98–111)
Creatinine, Ser: 0.68 mg/dL (ref 0.44–1.00)
GFR calc Af Amer: >60 mL/min (ref >60)
GFR calc non Af Amer: >60 mL/min (ref >60)
Glucose, Bld: 97 mg/dL (ref 70–99)
Potassium: 3.8 mmol/L (ref 3.5–5.1)
Sodium: 138 mmol/L (ref 135–145)

## 2019-10-03 LAB — ECHOCARDIOGRAM COMPLETE
Area-P 1/2: 4.8 cm2
Calc EF: 27.4 %
S' Lateral: 4.9 cm
Single Plane A2C EF: 23.1 %
Single Plane A4C EF: 21.4 %

## 2019-10-03 LAB — CBC
HCT: 48 % — ABNORMAL HIGH (ref 36.0–46.0)
Hemoglobin: 16.2 g/dL — ABNORMAL HIGH (ref 12.0–15.0)
MCH: 33.6 pg (ref 26.0–34.0)
MCHC: 33.8 g/dL (ref 30.0–36.0)
MCV: 99.6 fL (ref 80.0–100.0)
Platelets: 258 10*3/uL (ref 150–400)
RBC: 4.82 MIL/uL (ref 3.87–5.11)
RDW: 11.8 % (ref 11.5–15.5)
WBC: 7.7 10*3/uL (ref 4.0–10.5)
nRBC: 0 % (ref 0.0–0.2)

## 2019-10-03 LAB — BRAIN NATRIURETIC PEPTIDE: B Natriuretic Peptide: 367.5 pg/mL — ABNORMAL HIGH (ref 0.0–100.0)

## 2019-10-03 LAB — DIGOXIN LEVEL: Digoxin Level: 0.3 ng/mL — ABNORMAL LOW (ref 0.8–2.0)

## 2019-10-03 MED ORDER — IVABRADINE HCL 5 MG PO TABS: ORAL_TABLET | ORAL | 6 refills | Status: DC

## 2019-10-03 MED ORDER — MIDODRINE HCL 5 MG PO TABS: 2.5000 mg | ORAL_TABLET | Freq: Three times a day (TID) | ORAL | 6 refills | Status: DC

## 2019-10-03 NOTE — Progress Notes (Signed)
Advanced Heart Failure Clinic Note    Referring Physician: Gwenlyn Found  Primary Care: Dr. Garlon Hatchet  Primary Cardiologist: Gwenlyn Found   HPI: Robin Rose is a 64 y.o. female  with h/o tobacco abuse, breast cancer (s/p chemo and left mastectomy) and recently diagnosed systolic HF.    In 8/17 she was diagnosed with triple-negative breast cancer  with lymph node involvement. She had a clinically staged T4b, N1, M0 cancer which was stage IIIB. Her tumor was ER negative, PR negative and Her-2/neu negative. Her Ki67 was 30%. She underwent neoadjuvant chemotherapy including adriamycin (6 cycles)  followed by left mastectomy in 2/18 and XRT (completed 4/18).   Her EF 11/05/15 was 55% and by MUGA 04/06/16 was 69%. She was admitted to [facility] 08/02/16  with orthopnea and heart failure. She was diuresed 8 pounds. Her EF was 20-25% by echo on 08/03/16. RV was said to be normal. She saw Dr. Gwenlyn Found on 08/19/16. She was started on low-dose carvedilol. Lisinopril and lasix stopped by Dr. Garlon Hatchet on 08/15/16 due to low BP.   Admitted 1/1 with chest pain and shortness of breath. Had LHC/RHC unchanged 3 vessel disease, Placed on milrinone but later weaned off. Weight was 90 pounds.   Today she returns for HF follow up. Feels ok. Able to do her activities slowly. No CP or edema. Having trouble getting Corlanor at times because [facility] runs out. SBP 120-130 at home     Echo (08/15/18) EF ~25%   Cardiac studies:  CPX 11/18 FVC 2.01 (66%)    FEV1 1.53 (64%)     FEV1/FVC 76 (96%)     MVV 63 (70%)   Resting HR: 88 Peak HR: 126  (79% age predicted max HR)  BP rest: 124/76 BP peak: 158/70 Peak VO2: 16.8 (67% predicted peak VO2) VE/VCO2 slope: 48 OUES: 0.71 Peak RER: 1.07 Ventilatory Threshold: 12.7 (50% predicted and 76% measured peak VO2) VE/MVV: 51% O2pulse: 7  (100% predicted O2pulse)   ECHO 11/2016 EF 20-25% grade I DD.   RHC/LHC 03/16/2017   Mid LAD-1 lesion is 40% stenosed.  Mid  LAD-2 lesion is 95% stenosed.  Ost 2nd Diag to 2nd Diag lesion is 50% stenosed.  Ost Cx to Prox Cx lesion is 60% stenosed.  1st Mrg lesion is 100% stenosed.  Prox RCA lesion is 90% stenosed.  Mid RCA lesion is 30% stenosed.  Post Atrio lesion is 100% stenosed.  Dist RCA lesion is 100% stenosed.  Mid Cx lesion is 95% stenosed.  Dist Cx lesion is 60% stenosed.  Findings: Ao = 142/80 (105) LV = 158/25 RA = 1 RV = 29/2 PA = 30/19 (20) PCW = 20 Fick cardiac output/index = 2.4/1.7 PVR = 5.0 WU SVR = 3452  FA sat = 97% PA sat = 57%, 58% Assessment: 1. Severe 3v CAD unchanged from previous. No obvious culprit lesion for NSTEMI 2. Severe ischemic CM with EF 15% and low output physiology   RHC/LHC 11/07/2016   Prox RCA lesion, 90 %stenosed.  Post Atrio lesion, 100 %stenosed.  Mid RCA lesion, 30 %stenosed.  Ost Cx to Prox Cx lesion, 80 %stenosed.  1st Mrg lesion, 100 %stenosed.  Dist Cx lesion, 90 %stenosed.  Dist LAD lesion, 80 %stenosed.  Mid LAD-2 lesion, 70 %stenosed.  Mid LAD-1 lesion, 40 %stenosed.  Ost 2nd Diag to 2nd Diag lesion, 99 %stenosed.  Ao = 135/72 (97) LV = 135/15 RA = 1 RV = 28/3 PA = 29/8 (17) PCW = 5 Fick  cardiac output/index = 2.6/1.8 Thermo CO/CI = 7.0/5.0 PVR = 4.8 WU FA sat = 92% PA sat = 58%, 60%   PMHx:  . Allergic rhinitis 08/15/2016  . Chronic systolic heart failure ([place]) 08/15/2016  . Malignant neoplasm of overlapping sites of left female breast ([place]) 10/19/2015      Current Outpatient Medications  Medication Sig Dispense Refill  . acetaminophen (TYLENOL) 500 MG tablet Take 1,000 mg by mouth daily as needed for moderate pain or headache.     Marland Kitchen aspirin 81 MG chewable tablet Chew 1 tablet (81 mg total) by mouth daily. 90 tablet 4  . digoxin (LANOXIN) 0.125 MG tablet Take 0.5 tablets (0.0625 mg total) by mouth daily. 45 tablet 3  . diphenhydrAMINE (BENADRYL) 25 mg capsule Take 25 mg by mouth every 6 (six) hours as  needed for allergies.    . furosemide (LASIX) 20 MG tablet Take 20 mg by mouth 2 (two) times daily as needed for fluid or edema.     . midodrine (PROAMATINE) 5 MG tablet TAKE 1 TABLET BY MOUTH 3 TIMES DAILY WITH MEALS. 90 tablet 6  . ondansetron (ZOFRAN) 4 MG tablet Take 4 mg by mouth every 4 (four) hours as needed for nausea/vomiting.  2  . oxymetazoline (AFRIN) 0.05 % nasal spray Place 1 spray into both nostrils at bedtime as needed for congestion.    . potassium chloride (K-DUR,KLOR-CON) 10 MEQ tablet Take 40 mEq by mouth daily as needed (swelling). With first dose of furosemide    . Probiotic Product (PROBIOTIC ADVANCED PO) Take 1 capsule by mouth daily.     Robin Rose 5 MG TABS tablet TAKE 1 TABLET (5 MG TOTAL) BY MOUTH 2 (TWO) TIMES DAILY WITH A MEAL. (Patient not taking: Reported on 10/03/2019) 60 tablet 6  . nitroGLYCERIN (NITROSTAT) 0.4 MG SL tablet Place 1 tablet (0.4 mg total) under the tongue every 5 (five) minutes as needed for chest pain. 25 tablet 3   No current facility-administered medications for this encounter.    Allergies  Allergen Reactions  . Chantix [Varenicline] Nausea And Vomiting  . Codeine Other (See Comments)    GI Upset, headaches        Social History   Socioeconomic History  . Marital status: Divorced    Spouse name: Not on file  . Number of children: Not on file  . Years of education: Not on file  . Highest education level: Not on file  Occupational History  . Not on file  Tobacco Use  . Smoking status: Light Tobacco Smoker    Years: 38.00    Types: Cigarettes  . Smokeless tobacco: Never Used  Vaping Use  . Vaping Use: Never used  Substance and Sexual Activity  . Alcohol use: No  . Drug use: No  . Sexual activity: Not Currently  Other Topics Concern  . Not on file  Social History Narrative  . Not on file   Social Determinants of Health   Financial Resource Strain:   . Difficulty of Paying Living Expenses:   Food Insecurity:   .  Worried About Charity fundraiser in the Last Year:   . Arboriculturist in the Last Year:   Transportation Needs:   . Film/video editor (Medical):   Marland Kitchen Lack of Transportation (Non-Medical):   Physical Activity:   . Days of Exercise per Week:   . Minutes of Exercise per Session:   Stress:   . Feeling of Stress :  Social Connections:   . Frequency of Communication with Friends and Family:   . Frequency of Social Gatherings with Friends and Family:   . Attends Religious Services:   . Active Member of Clubs or Organizations:   . Attends Archivist Meetings:   Marland Kitchen Marital Status:   Intimate Partner Violence:   . Fear of Current or Ex-Partner:   . Emotionally Abused:   Marland Kitchen Physically Abused:   . Sexually Abused:    Family History   . Heart disease Mother - + CAD with CABG at age 72 . Kidney cancer Mother  . Heart disease Father  - CABG and AVR in 76s . Breast cancer Paternal Aunt  . Breast cancer Cousin  . Colon cancer Neg Hx    - 3 sisters - no CAD   Vitals:   10/03/19 1200  BP: (!) 138/76  Pulse: 102  SpO2: 98%  Weight: 48.5 kg (107 lb)   Wt Readings from Last 3 Encounters:  10/03/19 48.5 kg (107 lb)  07/04/19 48.2 kg (106 lb 3.2 oz)  12/13/18 47.2 kg (104 lb)    PHYSICAL EXAM: General:  Thin Well appearing. No resp difficulty HEENT: normal Neck: supple. no JVD. Carotids 2+ bilat; no bruits. No lymphadenopathy or thryomegaly appreciated. Cor: PMI nondisplaced. Tachy reguklar. No rubs, gallops or murmurs. Lungs: clear Abdomen: soft, nontender, nondistended. No hepatosplenomegaly. No bruits or masses. Good bowel sounds. Extremities: no cyanosis, clubbing, rash, edema Neuro: alert & orientedx3, cranial nerves grossly intact. moves all 4 extremities w/o difficulty. Affect pleasant  ECG: Sinus 96 LVH with repol. Personally reviewed  ASSESSMENT & PLAN: 1. Chronic systolic HF - 09/5914 .  ECHO EF 20-25%.  - 08/15/18  Echo EF 20-25% - Echo today 10/03/19 EF  20-25% RV ok  - LHC with severe 3 vessel disease. Poor candidate for CABG or PCI due to diffuse CAD - Overall improved recently. NYHA II-III. Volume status stable. Does not need lasix.  - No bb with low output.  - Has been off losartan due to hypotension and off lasix/spiro due to volume depletion. BP now improved will rechallenge with low-dose losartan - On digoxin  -  Tachycardia has returned, Need to maker sure she can get Corlanor.  - CPX reveals moderate to severe HF limitation with very high VEVCO2 slope. Spirometry not too bad. We had long talk about her situation. Not transplant candidate with severe PAD and breast CA.  - Failed milrinone attempt in 1/19 due to worsening tachycardia - Not candidate for advanced therapies and has failed inotropes. We offered her an evaluation at [facility] for 2nd opinion but she has declined. 2.Orthostatic hypotension -Stable/resolved 3. 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 S/p Adjuvant chemotherapy followed by left mastectomy and LN dissection 2/18 and XRT. Now complete. - - No change to current plan.   4. Tobacco use - Discussed smoking cessation  5. Pulmonary Nodules - saw Dr Vaughan Browner. CTwith scattered nodules. Thought to be non specific inflammation.   6. CAD - s/p NSTEM 1/19/ LHC 03/2017  Severe 3v CAD unchanged from previous. No obvious culprit lesion for NSTEMI - No s/s angina - continue ASA/statin - Continue prn NTG.     [name], MD  12:56 PM

## 2019-10-03 NOTE — Patient Instructions (Signed)
Decrease Midodrine to 2.5 mg Three times a day   Labs done today, your results will be available in MyChart, we will contact you for abnormal readings.  Your pharmacy states they now have your Corlanor in stock and you can pick it up today, we have also provided you samples  Please call our office in November to schedule your follow up appointment  If you have any questions or concerns before your next appointment please send [place] a message through [employer] or call our office at [phone].    TO LEAVE A MESSAGE FOR THE NURSE SELECT OPTION 2, PLEASE LEAVE A MESSAGE INCLUDING: . YOUR NAME . DATE OF BIRTH . CALL BACK NUMBER . REASON FOR CALL**this is important as we prioritize the call backs  [place] AS LONG AS YOU CALL BEFORE 4:00 PM  At the [facility], you and your health needs are our priority. As part of our continuing mission to provide you with exceptional heart care, we have created designated Provider Care Teams. These Care Teams include your primary Cardiologist (physician) and Advanced Practice Providers (APPs- Physician Assistants and Nurse Practitioners) who all work together to provide you with the care you need, when you need it.   You may see any of the following providers on your designated Care Team at your next follow up: Marland Kitchen Dr [name] . Dr Loralie Champagne . Darrick Grinder, NP . Lyda Jester, PA . Audry Riles, PharmD   Please be sure to bring in all your medications bottles to every appointment.

## 2019-10-03 NOTE — Progress Notes (Signed)
Echocardiogram 2D Echocardiogram has been performed.  Oneal Deputy Yash Cacciola 10/03/2019, 11:52 AM

## 2019-10-04 ENCOUNTER — Telehealth (HOSPITAL_COMMUNITY): Payer: Self-pay | Admitting: Pharmacy Technician

## 2019-10-04 NOTE — Telephone Encounter (Signed)
Spoke with patient. Her co-pay for Corlanor is $185.66 for a 30 day supply. Since she is a Medicare participant, I have started a [employer] patient assistance application for her.  Will email her the application. Once received, I will fax the application in.

## 2019-10-04 NOTE — Telephone Encounter (Signed)
Patient was having a hard time signing the emailed version of the application. Will mail off the application to her.  Will follow up.

## 2019-10-10 DIAGNOSIS — Z1231 Encounter for screening mammogram for malignant neoplasm of breast: Secondary | ICD-10-CM | POA: Diagnosis not present

## 2019-10-14 ENCOUNTER — Telehealth (HOSPITAL_COMMUNITY): Payer: Self-pay | Admitting: *Deleted

## 2019-10-14 NOTE — Telephone Encounter (Signed)
Pt called stating she's been having issues with the left side of her neck she said it feels like a cramp that starts at the back of her lower jaw.  Pt said during her mammogram on 7/26 the nurse has to position patient and kept moving patients head to the left. Pt said she told the RN she was going to pass out and she did. Pt is concerned and wants Dr.Bensimhon to be aware.   Routed to [place]

## 2019-10-15 NOTE — Telephone Encounter (Signed)
Please let me know if it persists.

## 2019-10-16 NOTE — Telephone Encounter (Signed)
Sent application in via fax.  Provided two bottles of Corlanor 5mg  samples (2 week supply).  LOT: [id number] EXP: 07/25  Will follow up with [employer] on application.

## 2019-10-16 NOTE — Telephone Encounter (Signed)
Spoke w/pt, she states the L side of her neck has been having this "cramping" feeling for the past couple of months and she forgot to mention it at her OV 7/22. She reports feeling dizzy off/on a few times since she passed out on 7/26, she states she checks her BP at home and it has been good, she feels she is drinking enough water and should not be dehydrated. Advised I would send to Dr Haroldine Laws to review, she is aware he is out of town

## 2019-10-17 NOTE — Telephone Encounter (Signed)
Pt aware and agreeable.  

## 2019-10-17 NOTE — Telephone Encounter (Signed)
I doubt this is cardiac. If continues would advise her to go to PCP or urgent care to see if it needs imaging,

## 2019-10-22 ENCOUNTER — Other Ambulatory Visit (HOSPITAL_COMMUNITY): Payer: Self-pay | Admitting: Internal Medicine

## 2019-10-22 DIAGNOSIS — C773 Secondary and unspecified malignant neoplasm of axilla and upper limb lymph nodes: Secondary | ICD-10-CM | POA: Diagnosis not present

## 2019-10-22 DIAGNOSIS — C50812 Malignant neoplasm of overlapping sites of left female breast: Secondary | ICD-10-CM | POA: Diagnosis not present

## 2019-10-22 DIAGNOSIS — Z171 Estrogen receptor negative status [ER-]: Secondary | ICD-10-CM | POA: Diagnosis not present

## 2019-10-30 NOTE — Telephone Encounter (Signed)
Patient called in stating that she received a message from [facility] stating that they were missing a page on her application (provider portion). I went ahead and sent that again.  Also, provided two weeks of Corlanor samples. We should have a determination by then.   LOT: [id number] EXP: 09/2023  Will follow up.

## 2019-11-04 NOTE — Telephone Encounter (Signed)
[employer] has denied the patient's application at this time, they want her to apply for and be denied LIS. If denied, the denial would need to be sent in to [facility] for redetermination. Sent [place] (CSW), a message requesting help applying for LIS.

## 2019-11-05 ENCOUNTER — Telehealth (HOSPITAL_COMMUNITY): Payer: Self-pay | Admitting: Licensed Clinical Social Worker

## 2019-11-05 NOTE — Telephone Encounter (Signed)
CSW consulted by patient advocate to help pt with Extra Help/LIS program application as [employer] is requiring pt apply before they offer assistance.  CSW called pt and helped to complete application over the phone.  Application submitted and should hear back in 3-4 weeks from [facility]- informed pt to call patient advocate when she received letter informing her of her eligibility  Will continue to follow and assist as needed  Jorge Ny, [place] Worker [facility] Desk#: [phone] Cell#: [phone]

## 2019-11-06 NOTE — Telephone Encounter (Signed)
Spoke with patient this morning. She is concerned that she will run out of Corlanor before she gets a determination for her LIS application.  Provided a months worth of samples for her. The samples will be at the check in desk for her to pick up.  LOT: [id number] EXP: 07/25

## 2019-11-21 NOTE — Telephone Encounter (Signed)
Patient called stating that she was denied for LIS. She is going to mail the letter to the office for me to send in on her behalf.  Will follow up.

## 2019-11-26 ENCOUNTER — Telehealth (HOSPITAL_COMMUNITY): Payer: Self-pay | Admitting: Licensed Clinical Social Worker

## 2019-11-26 NOTE — Telephone Encounter (Addendum)
CSW called pt to check in regarding Extra Help program application status- pt reports she had received a denial from the program and mailed it back into clinic attn to pharmacy patient advocate- once received we will turn in to complete [employer] patient assistance application.  Jorge Ny, LCSW Clinical Social Worker [facility] Desk#: [phone] Cell#: [phone]

## 2019-12-07 DIAGNOSIS — I700 Atherosclerosis of aorta: Secondary | ICD-10-CM | POA: Diagnosis not present

## 2019-12-07 DIAGNOSIS — N3091 Cystitis, unspecified with hematuria: Secondary | ICD-10-CM | POA: Diagnosis not present

## 2019-12-07 DIAGNOSIS — N132 Hydronephrosis with renal and ureteral calculous obstruction: Secondary | ICD-10-CM | POA: Diagnosis not present

## 2019-12-07 DIAGNOSIS — Z90710 Acquired absence of both cervix and uterus: Secondary | ICD-10-CM | POA: Diagnosis not present

## 2019-12-07 DIAGNOSIS — K5730 Diverticulosis of large intestine without perforation or abscess without bleeding: Secondary | ICD-10-CM | POA: Diagnosis not present

## 2019-12-07 DIAGNOSIS — N200 Calculus of kidney: Secondary | ICD-10-CM | POA: Diagnosis not present

## 2019-12-10 NOTE — Telephone Encounter (Signed)
Patient called today to check the status of her assistance application. I have yet to receive anything in the mail from her at this point. I advised her that it may just be delayed. There is a possibility that it may have been lost in the mail as well.  The patient called and requested for Social Security to send her another denial letter.   Samples provided for as well.

## 2019-12-13 NOTE — Telephone Encounter (Signed)
Sent in denial letter from [facility] via fax.  Will follow up.

## 2019-12-18 NOTE — Telephone Encounter (Signed)
[facility] Patient Advocate Encounter   Patient was approved to receive Cor[place] from [facility].  Patient ID: [id number] Effective dates: 12/17/19 through 03/13/20  Spoke with the patient and provided approval along with the phone number.  Charlann Boxer, CPhT

## 2020-01-01 IMAGING — DX DG CHEST 2V
2 series · 2 of 2 positions shown · non-contrast
Comparison: None.

CLINICAL DATA: Chest pain x2 hours ago and last evening. History of
breast cancer, chemotherapy and radiation. Former smoker.

EXAM:
CHEST  2 VIEW

[w chest lat]
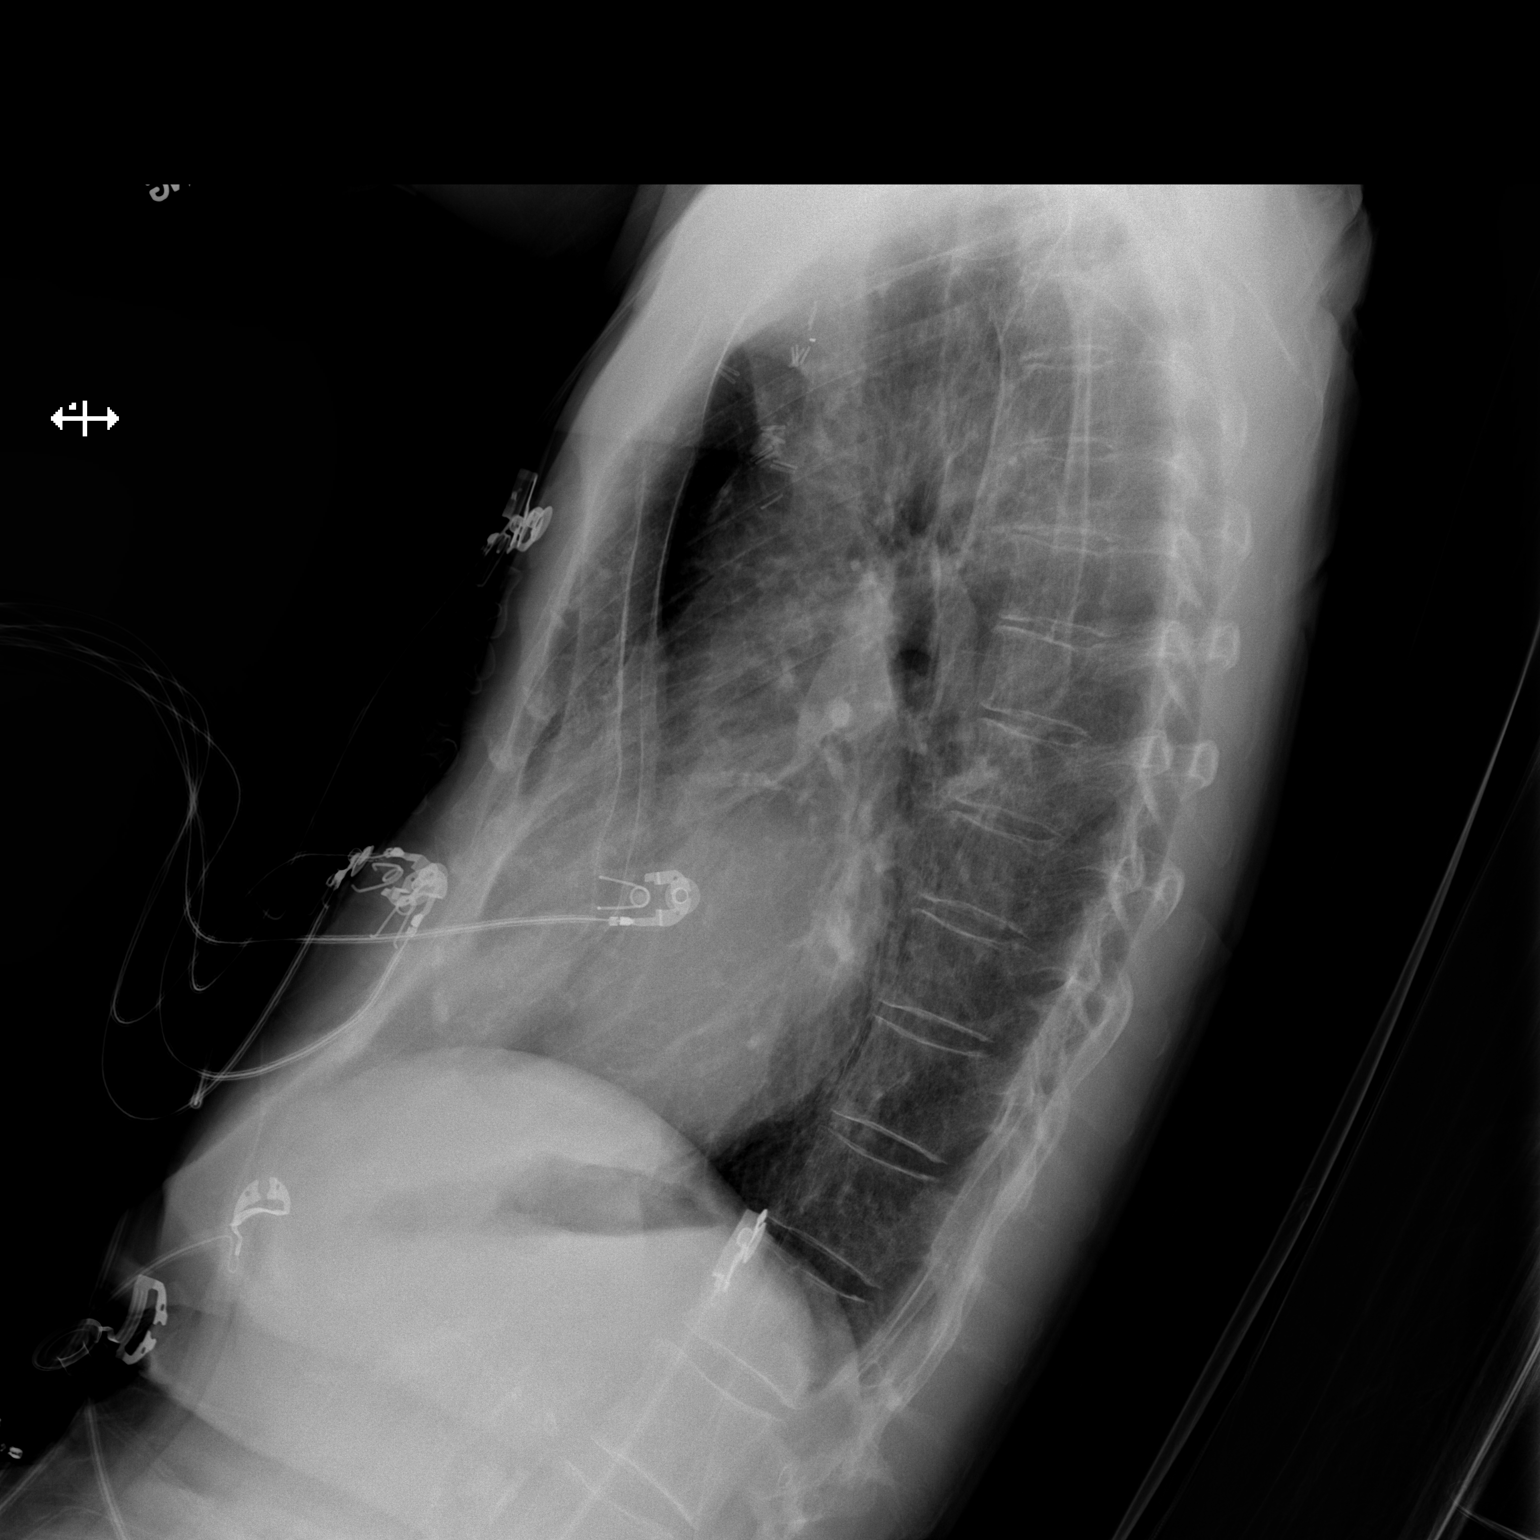

[x chest ap]
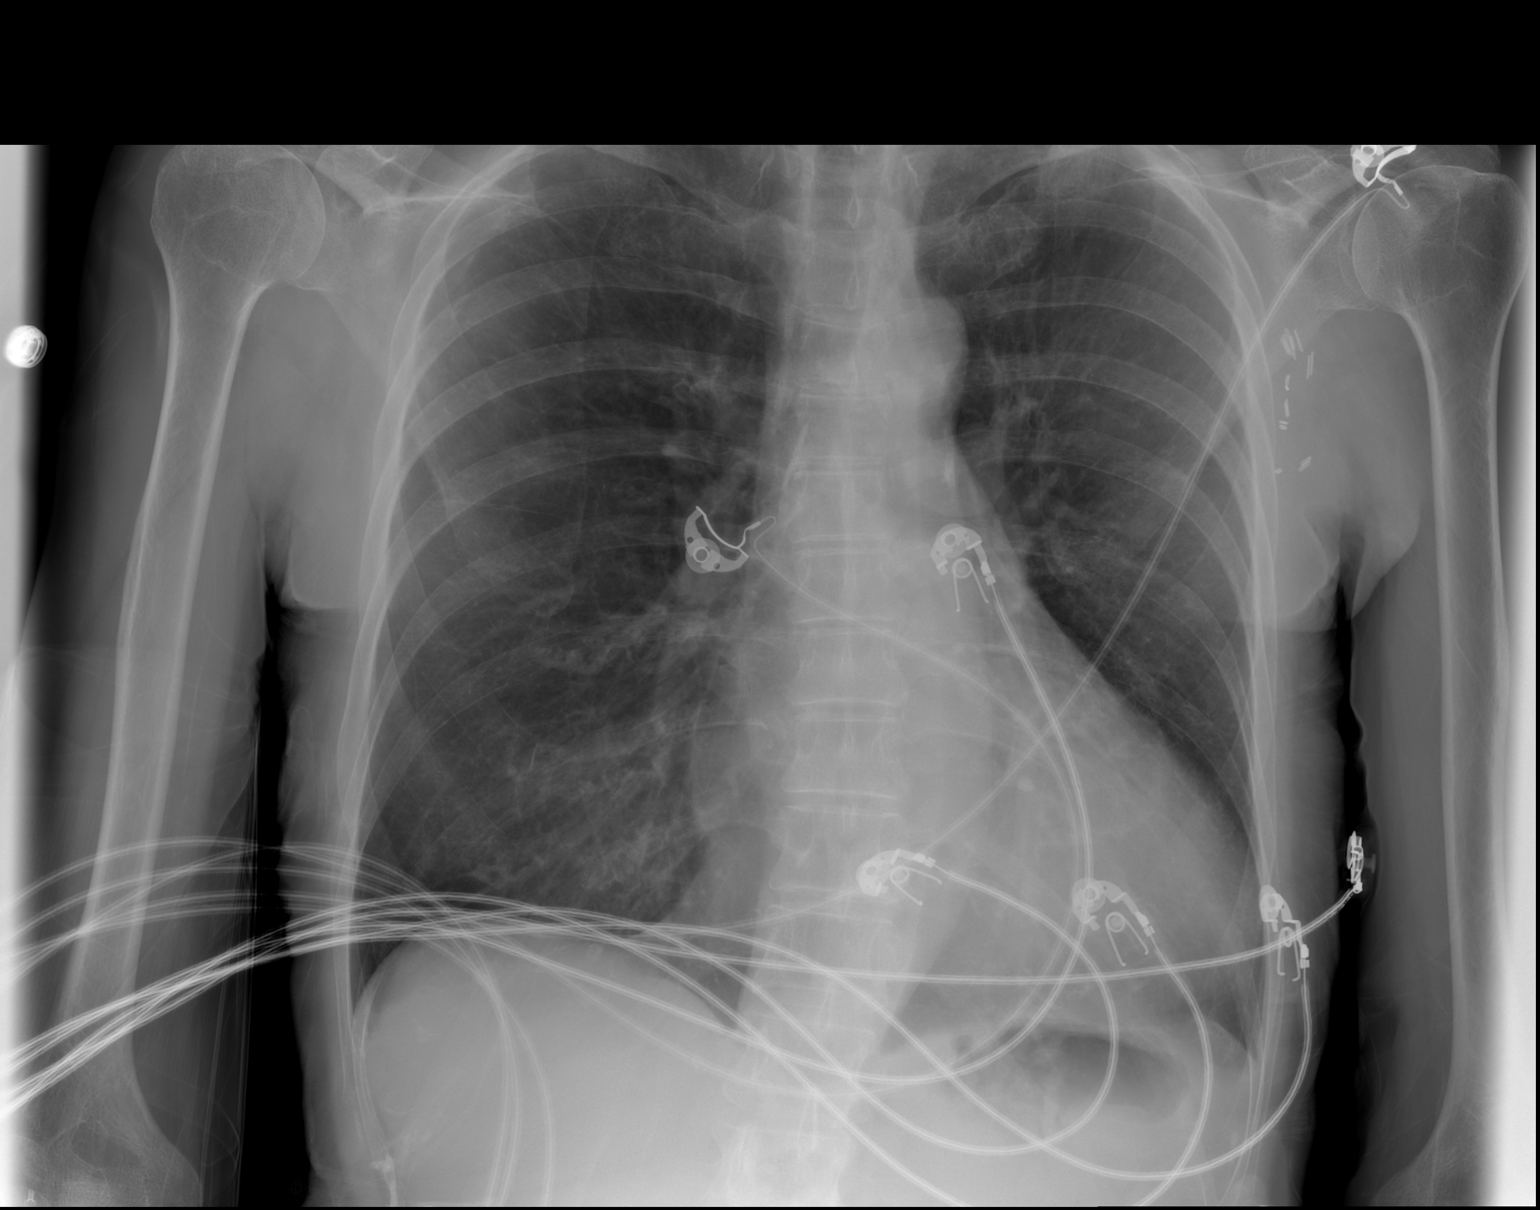

[2 of 2 positions shown; findings below may reference images not displayed]

FINDINGS: Pectus excavatum likely contributing its to the hazy appearance of
right middle lobe distribution. Pneumonia is not entirely excluded
but believed less likely given the pectus deformity. There is
cardiomegaly with minimal aortic atherosclerosis. No overt pulmonary
edema, effusion or pneumothorax. Left axillary lymph node dissection
clips are noted. No acute nor suspicious osseous abnormalities.
IMPRESSION: 1. No active cardiopulmonary disease.
2. Pectus excavatum likely contributes to the hazy pulmonary opacity
adjacent to the right heart border. Pneumonia although not entirely
excluded is believed less likely as a result.
3. Aortic atherosclerosis.

## 2020-01-16 ENCOUNTER — Other Ambulatory Visit: Payer: Self-pay | Admitting: Hematology and Oncology

## 2020-01-16 DIAGNOSIS — C50812 Malignant neoplasm of overlapping sites of left female breast: Secondary | ICD-10-CM

## 2020-01-18 DIAGNOSIS — Z23 Encounter for immunization: Secondary | ICD-10-CM | POA: Diagnosis not present

## 2020-01-29 ENCOUNTER — Telehealth: Payer: Self-pay | Admitting: Oncology

## 2020-01-29 ENCOUNTER — Inpatient Hospital Stay: Payer: Medicare Other | Attending: Hematology and Oncology

## 2020-01-29 ENCOUNTER — Other Ambulatory Visit: Payer: Self-pay | Admitting: Hematology and Oncology

## 2020-01-29 ENCOUNTER — Inpatient Hospital Stay (INDEPENDENT_AMBULATORY_CARE_PROVIDER_SITE_OTHER): Payer: Medicare Other | Admitting: Hematology and Oncology

## 2020-01-29 ENCOUNTER — Other Ambulatory Visit: Payer: Self-pay

## 2020-01-29 ENCOUNTER — Encounter: Payer: Self-pay | Admitting: Hematology and Oncology

## 2020-01-29 DIAGNOSIS — C50812 Malignant neoplasm of overlapping sites of left female breast: Secondary | ICD-10-CM | POA: Insufficient documentation

## 2020-01-29 DIAGNOSIS — Z9012 Acquired absence of left breast and nipple: Secondary | ICD-10-CM | POA: Diagnosis not present

## 2020-01-29 DIAGNOSIS — Z923 Personal history of irradiation: Secondary | ICD-10-CM | POA: Diagnosis not present

## 2020-01-29 DIAGNOSIS — Z79899 Other long term (current) drug therapy: Secondary | ICD-10-CM | POA: Insufficient documentation

## 2020-01-29 DIAGNOSIS — I252 Old myocardial infarction: Secondary | ICD-10-CM | POA: Insufficient documentation

## 2020-01-29 DIAGNOSIS — Z171 Estrogen receptor negative status [ER-]: Secondary | ICD-10-CM

## 2020-01-29 DIAGNOSIS — C50512 Malignant neoplasm of lower-outer quadrant of left female breast: Secondary | ICD-10-CM | POA: Diagnosis not present

## 2020-01-29 DIAGNOSIS — C773 Secondary and unspecified malignant neoplasm of axilla and upper limb lymph nodes: Secondary | ICD-10-CM | POA: Insufficient documentation

## 2020-01-29 DIAGNOSIS — I2510 Atherosclerotic heart disease of native coronary artery without angina pectoris: Secondary | ICD-10-CM | POA: Insufficient documentation

## 2020-01-29 DIAGNOSIS — I509 Heart failure, unspecified: Secondary | ICD-10-CM | POA: Insufficient documentation

## 2020-01-29 DIAGNOSIS — E876 Hypokalemia: Secondary | ICD-10-CM

## 2020-01-29 LAB — CBC WITH DIFFERENTIAL (CANCER CENTER ONLY)
Abs Immature Granulocytes: 0.02 10*3/uL (ref 0.00–0.07)
Basophils Absolute: 0.1 10*3/uL (ref 0.0–0.1)
Basophils Relative: 1 %
Eosinophils Absolute: 0.1 10*3/uL (ref 0.0–0.5)
Eosinophils Relative: 1 %
HCT: 47.8 % — ABNORMAL HIGH (ref 36.0–46.0)
Hemoglobin: 16.3 g/dL — ABNORMAL HIGH (ref 12.0–15.0)
Immature Granulocytes: 0 %
Lymphocytes Relative: 25 %
Lymphs Abs: 2.3 10*3/uL (ref 0.7–4.0)
MCH: 33.9 pg (ref 26.0–34.0)
MCHC: 34.1 g/dL (ref 30.0–36.0)
MCV: 99.4 fL (ref 80.0–100.0)
Monocytes Absolute: 0.6 10*3/uL (ref 0.1–1.0)
Monocytes Relative: 6 %
Neutro Abs: 6 10*3/uL (ref 1.7–7.7)
Neutrophils Relative %: 67 %
Platelet Count: 249 10*3/uL (ref 150–400)
RBC: 4.81 MIL/uL (ref 3.87–5.11)
RDW: 12.4 % (ref 11.5–15.5)
WBC Count: 9.1 10*3/uL (ref 4.0–10.5)
nRBC: 0 % (ref 0.0–0.2)

## 2020-01-29 LAB — CMP (CANCER CENTER ONLY)
ALT: 20 U/L (ref 0–44)
AST: 19 U/L (ref 15–41)
Albumin: 4.7 g/dL (ref 3.5–5.0)
Alkaline Phosphatase: 56 U/L (ref 38–126)
Anion gap: 6 (ref 5–15)
BUN: 10 mg/dL (ref 8–23)
CO2: 32 mmol/L (ref 22–32)
Calcium: 9.2 mg/dL (ref 8.9–10.3)
Chloride: 99 mmol/L (ref 98–111)
Creatinine: 0.66 mg/dL (ref 0.44–1.00)
GFR, Estimated: >60 mL/min (ref >60)
Glucose, Bld: 109 mg/dL — ABNORMAL HIGH (ref 70–99)
Potassium: 3.1 mmol/L — ABNORMAL LOW (ref 3.5–5.1)
Sodium: 137 mmol/L (ref 135–145)
Total Bilirubin: 0.5 mg/dL (ref 0.3–1.2)
Total Protein: 7.3 g/dL (ref 6.5–8.1)

## 2020-01-29 MED ORDER — POTASSIUM CHLORIDE ER 10 MEQ PO TBCR: 10.0000 meq | EXTENDED_RELEASE_TABLET | Freq: Two times a day (BID) | ORAL | 0 refills | Status: DC

## 2020-01-29 NOTE — Telephone Encounter (Signed)
Per 11/17 los appt given to patient

## 2020-01-29 NOTE — Progress Notes (Signed)
[facility]  [address] [place],  [place]  [address] [phone]  Clinic Day:  01/29/2020  Referring physician: [name], MD   CHIEF COMPLAINT:  CC: Follow-up of stage IIIB triple negative breast cancer.  Current Treatment:    Observation.   HISTORY OF PRESENT ILLNESS:  Robin Rose is a 64 y.o. female with a history of clinical stage IIIB (T4b N1 M0) triple negative left breast cancer diagnosed in August 2017.  She felt a large area of firmness in the left lower outer quadrant for approximately 6 weeks and noticed nipple inversion.  She thought it was a muscle spasm.  The area was also pruritic.  She was seen by Dr. Henrene Pastor in July, who scheduled a diagnostic mammogram.  This revealed 3 lesions in the left breast, one at 1 o'clock measuring 1.4 cm, another at 2 o'clock measuring 1.9 cm and a 3rd at 12 o'clock measuring 4.4 cm behind the nipple.  Ultrasound confirmed these lesions and she was also found to have another lesion at 6:30 o'clock,  measuring 3 cm, as well as multiple pathologically enlarged nodes of the left axilla.  She had multiple biopsies in early August.  Pathology revealed the lesion at 2 o'clock to be a grade 3 invasive ductal carcinoma with ductal carcinoma in situ in multiple cores, and the lesion at 6:30 o'clock to be a grade 3, invasive ductal carcinoma with ductal carcinoma in situ in multiple cores.  Biopsy of a left axillary lymph node revealed high-grade metastatic carcinoma.  Estrogen and progesterone receptors were negative and her 2 Neu negative.  Ki 67 was 30%.  Due to her personal and family history of breast cancer, she underwent testing for hereditary breast and ovarian cancer with the [facility] Cancer Panel test.  This did not reveal any clinically significant mutation or variants of uncertain significance.  She received neoadjuvant TAC chemotherapy, along with Neulasta to prevent complications of  prolonged neutropenia.  She had significant toxicities secondary to chemotherapy including severe nausea, vomiting and diarrhea.  She was given fluids, antiemetics, and IV potassium. She was also given Zyprexa 6m at bedtime for the nausea.  She was then found to have C. difficile infection, which caused delay in her second cycle of chemotherapy.  She completed 6 cycles of chemotherapy.  She then underwent left mastectomy in February 2018.  Pathology revealed multicentric invasive ductal carcinoma, grade 3, with foci of ductal carcinoma in situ.  The largest focus of invasive carcinoma measured approximately 10 mm, and 2/5 lymph nodes had isolated tumor cells.  Therefore, her disease was downstaged to a pathologic stage IA (ypT1b ypN0).   She received adjuvant radiation to the left chest wall and axilla, completed in April 2018.  Prior to starting radiation, she was felt to have pleural effusion on her radiation simulation CT, so she underwent CT chest and abdomen.  This revealed trace bilateral pleural effusions, a postop seroma in the left axilla, and age advanced 3 vessel coronary artery disease, but no evidence of recurrent or metastatic disease.  She had chronic nausea since surgery.  She was referred to occupational therapy for lymphedema of the left upper extremity and was given a compression sleeve to wear.  She had physical therapy for reconditioning.  She was seen in May 2018 with increasing dyspnea.  CT angiogram was negative for pulmonary emboli or recurrent cancer,  but she did have a mild pleural effusion.  Within 2 days,  she was admitted to the hospital with respiratory distress and transferred to the intensive care unit.  She was found to be in congestive heart failure with bilateral pleural effusions and placed on diuretics, as well as digoxin and lisinopril.  Echocardiogram showed impaired left ventricular function with an ejection fraction between 20-25% and left atrial dilatation with mild mitral  regurgitation.  MUGA scan in January 2018 had shown a slight decrease of her EF from 64% to 57%.  We discussed the fact that this may have been a combination of the radiation and chemotherapy.  However, she was found to have severe coronary artery disease.   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  She refuses MRI scans.  Her port was removed in November 2018.  On March 14, 2017, she had an acute myocardial infarction, but recovered fairly well. She continues to follow with [place] every 6 months and Dr. Noberto Retort yearly.  INTERVAL HISTORY:  Robin Rose is seen in the Oncology clinic for follow up of her triple negative breast cancer.  She denies any changes in her right breast or left mastectomy site. She denies fever, chills or night sweats.  She states her energy level fluctuates up and down. She continues to report chronic intermittent nausea without vomiting.  She denies diarrhea, constipation or abdominal pain.  She has occasional cough, which she attributes to acid reflux when she does not take her medication. She denies pain. Her appetite is good. Her weight has decreased 2 pounds over last 6 months. She states she has not had to take furosemide for about 2 years.  She is not on any other diuretic.  Right screening mammogram in July did not reveal any evidence of malignancy.  She saw Dr. Noberto Retort after that.  She states she has had three doses of the COVID-19 vaccine, with her booster about 2 weeks ago.  She plans to get her flu shot soon.  REVIEW OF SYSTEMS:  Review of Systems  Constitutional: Negative for appetite change, chills, fatigue and fever.  HENT:   Negative for lump/mass, mouth sores and sore throat.   Eyes: Negative for eye problems and icterus.  Respiratory: Negative for cough, shortness of breath and wheezing.   Cardiovascular: Negative  for chest pain, leg swelling and palpitations.  Gastrointestinal: Positive for nausea (Chronic, intermittent, mild). Negative for abdominal pain, constipation, diarrhea and vomiting.  Endocrine: Negative for hot flashes.  Genitourinary: Negative for difficulty urinating.   Musculoskeletal: Negative for arthralgias, back pain and myalgias.  Skin: Negative for itching and rash.  Neurological: Negative for dizziness and headaches.  Hematological: Negative for adenopathy. Does not bruise/bleed easily.  Psychiatric/Behavioral: Negative for depression. The patient is not nervous/anxious.      VITALS:  Blood pressure (!) 139/58, pulse 94, temperature 98.3 F (36.8 C), temperature source Oral, resp. rate 16, height 5' 2.5" (1.588 m), weight 105 lb 12.8 oz (48 kg), SpO2 97 %.  Wt Readings from Last 3 Encounters:  01/29/20 105 lb 12.8 oz (48 kg)  10/03/19 107 lb (48.5 kg)  07/04/19 106 lb 3.2 oz (48.2 kg)    Body mass index is 19.04 kg/m.  Performance status (ECOG): 0 - Asymptomatic  PHYSICAL EXAM:  Physical Exam Constitutional:      General: She is not in acute distress.    Appearance: Normal appearance.  HENT:  Mouth/Throat:     Mouth: Mucous membranes are moist.     Pharynx: Oropharynx is clear.  Eyes:     General: No scleral icterus.    Conjunctiva/sclera: Conjunctivae normal.     Pupils: Pupils are equal, round, and reactive to light.  Cardiovascular:     Rate and Rhythm: Normal rate and regular rhythm.     Heart sounds: Murmur (3/6 systolic murmur) heard.  No friction rub. No gallop.   Pulmonary:     Effort: No respiratory distress.     Breath sounds: Normal breath sounds. No stridor. No wheezing, rhonchi or rales.  Chest:     Chest wall: No mass.     Breasts:        Right: Normal. No inverted nipple, mass, nipple discharge, skin change or tenderness.        Left: Absent.     Comments: Left mastectomy site is without evidence of recurrence. Abdominal:     General:  There is no distension.     Palpations: Abdomen is soft. There is no mass.     Tenderness: There is no abdominal tenderness. There is no guarding.     Hernia: No hernia is present.  Musculoskeletal:        General: No swelling.     Cervical back: Neck supple.     Right lower leg: No edema.     Left lower leg: No edema.  Skin:    General: Skin is warm and dry.     Findings: No rash.  Neurological:     General: No focal deficit present.     Mental Status: She is alert and oriented to person, place, and time. Mental status is at baseline.  Psychiatric:        Mood and Affect: Mood normal.        Behavior: Behavior normal.   Lymph nodes:   There is no cervical, clavicular, axillary or inguinal lymphadenopathy.   LABS:   CBC Latest Ref Rng & Units 01/29/2020 10/03/2019 03/16/2017  WBC 4.0 - 10.5 K/uL 9.1 7.7 6.0  Hemoglobin 12.0 - 15.0 g/dL 16.3(H) 16.2(H) 14.2  Hematocrit 36 - 46 % 47.8(H) 48.0(H) 40.9  Platelets 150 - 400 K/uL 249 258 225   CMP Latest Ref Rng & Units 01/29/2020 10/03/2019 10/04/2017  Glucose 70 - 99 mg/dL 109(H) 97 94  BUN 8 - 23 mg/dL _0 Creatinine 0.44 - 1.00 mg/dL 0.66 0.68 0.66  Sodium 135 - 145 mmol/L 137 138 136  Potassium 3.5 - 5.1 mmol/L 3.1(L) 3.8 3.8  Chloride 98 - 111 mmol/L 99 104 100  CO2 22 - 32 mmol/L 32 23 24  Calcium 8.9 - 10.3 mg/dL 9.2 9.3 9.4  Total Protein 6.5 - 8.1 g/dL 7.3 - -  Total Bilirubin 0.3 - 1.2 mg/dL 0.5 - -  Alkaline Phos 38 - 126 U/L 56 - -  AST 15 - 41 U/L 19 - -  ALT 0 - 44 U/L 20 - -     No results found for: CEA1 / No results found for: CEA1 No results found for: PSA1 No results found for: ICH798 No results found for: VSY548  No results found for: TOTALPROTELP, ALBUMINELP, A1GS, A2GS, BETS, BETA2SER, GAMS, MSPIKE, SPEI No results found for: TIBC, FERRITIN, IRONPCTSAT No results found for: LDH  STUDIES:  No results found.    HISTORY:   Past Medical History:  Diagnosis Date  . Breast cancer, left ([place])  2018  . CHF (  congestive heart failure) ([place])   . Coronary artery disease   . Family history of adverse reaction to anesthesia    "siblings, parents get PONV too" (03/15/2017)  . NSTEMI (non-ST elevated myocardial infarction) ([place]) 03/14/2017  . PONV (postoperative nausea and vomiting)     Past Surgical History:  Procedure Laterality Date  . BREAST BIOPSY Left 10/2015  . CARDIAC CATHETERIZATION    . GANGLION CYST EXCISION Left 1990s  . IR FLUORO GUIDE CV LINE RIGHT  03/17/2017  . IR US GUIDE VASC ACCESS RIGHT  03/17/2017  . MASTECTOMY Left 04/20/2016  . PORTA CATH INSERTION Right 10/2015  . PORTA CATH REMOVAL Right ~ 01/2017  . RIGHT HEART CATH N/A 02/13/2017   Procedure: RIGHT HEART CATH;  Surgeon: Jolaine Artist, MD;  Location: [facility];  Service: Cardiovascular;  Laterality: N/A;  . RIGHT/LEFT HEART CATH AND CORONARY ANGIOGRAPHY N/A 11/07/2016   Procedure: RIGHT/LEFT HEART CATH AND CORONARY ANGIOGRAPHY;  Surgeon: Jolaine Artist, MD;  Location: [facility];  Service: Cardiovascular;  Laterality: N/A;  . RIGHT/LEFT HEART CATH AND CORONARY ANGIOGRAPHY N/A 03/16/2017   Procedure: RIGHT/LEFT HEART CATH AND CORONARY ANGIOGRAPHY;  Surgeon: Jolaine Artist, MD;  Location: [facility];  Service: Cardiovascular;  Laterality: N/A;  . TUBAL LIGATION  1982  . ULTRASOUND GUIDANCE FOR VASCULAR ACCESS  03/16/2017   Procedure: Ultrasound Guidance For Vascular Access;  Surgeon: Jolaine Artist, MD;  Location: [facility];  Service: Cardiovascular;;  . VAGINAL HYSTERECTOMY  1988    History reviewed. No pertinent family history.  Social History:  reports that she has been smoking cigarettes. She has smoked for the past 38.00 years. She has never used smokeless tobacco. She reports that she does not drink alcohol and does not use drugs.The patient is alone today.  Allergies:  Allergies  Allergen Reactions  . Chantix [Varenicline] Nausea And Vomiting  . Codeine  Other (See Comments)    GI Upset, headaches      Current Medications: Current Outpatient Medications  Medication Sig Dispense Refill  . acetaminophen (TYLENOL) 500 MG tablet Take 1,000 mg by mouth daily as needed for moderate pain or headache.     Marland Kitchen aspirin 81 MG chewable tablet Chew 1 tablet (81 mg total) by mouth daily. 90 tablet 4  . digoxin (LANOXIN) 0.125 MG tablet TAKE 1/2 TABLETS (0.0625 MG TOTAL) BY MOUTH DAILY. 45 tablet 3  . diphenhydrAMINE (BENADRYL) 25 mg capsule Take 25 mg by mouth every 6 (six) hours as needed for allergies.    . furosemide (LASIX) 20 MG tablet Take 20 mg by mouth 2 (two) times daily as needed for fluid or edema.     . ivabradine (CORLANOR) 5 MG TABS tablet TAKE 1 TABLET (5 MG TOTAL) BY MOUTH 2 (TWO) TIMES DAILY WITH A MEAL. 60 tablet 6  . midodrine (PROAMATINE) 5 MG tablet Take 0.5 tablets (2.5 mg total) by mouth 3 (three) times daily with meals. 90 tablet 6  . ondansetron (ZOFRAN) 4 MG tablet Take 4 mg by mouth every 4 (four) hours as needed for nausea/vomiting.  2  . oxymetazoline (AFRIN) 0.05 % nasal spray Place 1 spray into both nostrils at bedtime as needed for congestion.    . Probiotic Product (PROBIOTIC ADVANCED PO) Take 1 capsule by mouth daily.     . nitroGLYCERIN (NITROSTAT) 0.4 MG SL tablet Place 1 tablet (0.4 mg total) under the tongue every 5 (five) minutes as needed for chest  pain. 25 tablet 3  . potassium chloride (KLOR-CON) 10 MEQ tablet Take 1 tablet (10 mEq total) by mouth 2 (two) times daily. 60 tablet 0   No current facility-administered medications for this visit.     ASSESSMENT & PLAN:   Assessment/Plan: 1. Stage IIIB triple negative left breast cancer treated with aggressive neoadjuvant chemotherapy, followed by mastectomy and postmastectomy radiation.  She remains without evidence of recurrence.  She will continue observation. 2. Significant coronary artery disease and congestive heart failure, as well as history of myocardial  infarction.  She is doing well at this time.  3. Mild elevation of the hemoglobin and hematocrit, intermittently since July 2019, which is stable.  Dr. Hinton Rao recommends continued monitoring. 4. Hypokalemia.  She is not currently taking any potassium supplement.  She has difficulty swallowing large pills, so will give her potassium chloride 20 mEq twice daily at this time.  I will plan to repeat a basic metabolic profile in 3-4 weeks.  As above, I will place her on potassium supplement and repeat labs in 3-4 weeks.  We will then plan to see her back in 6 months with a CBC and comprehensive metabolic profile.  The patient understands the plans discussed today and is in agreement with them.  She knows to contact our office if she develops concerns prior to her next appointment.   Marvia Pickles, PA-C

## 2020-02-04 ENCOUNTER — Other Ambulatory Visit: Payer: Self-pay | Admitting: Hematology and Oncology

## 2020-02-04 DIAGNOSIS — Z171 Estrogen receptor negative status [ER-]: Secondary | ICD-10-CM

## 2020-02-04 DIAGNOSIS — C50812 Malignant neoplasm of overlapping sites of left female breast: Secondary | ICD-10-CM

## 2020-02-12 DIAGNOSIS — Z23 Encounter for immunization: Secondary | ICD-10-CM | POA: Diagnosis not present

## 2020-02-19 ENCOUNTER — Other Ambulatory Visit: Payer: Self-pay | Admitting: Hematology and Oncology

## 2020-02-19 ENCOUNTER — Inpatient Hospital Stay: Payer: Medicare Other | Attending: Hematology and Oncology

## 2020-02-19 ENCOUNTER — Other Ambulatory Visit: Payer: Self-pay

## 2020-02-19 DIAGNOSIS — D649 Anemia, unspecified: Secondary | ICD-10-CM | POA: Diagnosis not present

## 2020-02-19 DIAGNOSIS — Z0001 Encounter for general adult medical examination with abnormal findings: Secondary | ICD-10-CM | POA: Diagnosis not present

## 2020-02-19 LAB — CBC: RBC: 5.01 (ref 3.87–5.11)

## 2020-02-19 LAB — CBC AND DIFFERENTIAL
HCT: 50 — AB (ref 36–46)
Hemoglobin: 17.2 — AB (ref 12.0–16.0)
Neutrophils Absolute: 6.23
Platelets: 220 (ref 150–399)
WBC: 9.3

## 2020-02-19 LAB — BASIC METABOLIC PANEL
BUN: 11 (ref 4–21)
CO2: 26 — AB (ref 13–22)
Chloride: 101 (ref 99–108)
Creatinine: 0.6 (ref 0.5–1.1)
Glucose: 114
Potassium: 4 (ref 3.4–5.3)
Sodium: 137 (ref 137–147)

## 2020-02-19 LAB — COMPREHENSIVE METABOLIC PANEL
Albumin: 5.1 — AB (ref 3.5–5.0)
Calcium: 10 (ref 8.7–10.7)

## 2020-02-19 LAB — HEPATIC FUNCTION PANEL
ALT: 20 (ref 7–35)
AST: 27 (ref 13–35)
Alkaline Phosphatase: 68 (ref 25–125)
Bilirubin, Total: 0.6

## 2020-03-19 NOTE — Progress Notes (Addendum)
Advanced Heart Failure Clinic Note    Referring Physician: Gwenlyn Found  Primary Care: Dr. Garlon Hatchet  Primary Cardiologist: Gwenlyn Found   HPI: Robin Rose is a 65 y.o. female with h/o tobacco abuse, breast cancer (s/p chemo and left mastectomy) and systolic HF.    In 8/17 she was diagnosed with triple-negative breast cancer with lymph node involvement. She underwent neoadjuvant chemotherapy including adriamycin (6 cycles)  followed by left mastectomy in 2/18 and XRT (completed 4/18).   Her EF 11/05/15 was 55% and by MUGA 04/06/16 was 69%. She was admitted to [facility] 5/18  with acute HF. EF was 20-25% by echo on 08/03/16.  Cath showed severe 3vCAD - not candidate for CABG due to diffuse CAD and size.  Failed milrinone attempt in 1/19 due to worsening tachycardia. Has been manger medically.   Echo 10/03/19 EF 20-25% RV ok   Here for f/u with her sister. Not overly active. Has groceries delivered due to Covid. Gets SOB/fatigued with any activity. No change. BP stable on midodrine 2.5 tid. No dizziness or presyncope. HR improved with ivabradine. Continue with occasional pain in left neck. No clear triggers. Can be transient or last a while. No associated symptoms.   Echo (08/15/18) EF ~25%   Cardiac studies:  CPX 11/18 FVC 2.01 (66%)    FEV1 1.53 (64%)     FEV1/FVC 76 (96%)     MVV 63 (70%)   Resting HR: 88 Peak HR: 126  (79% age predicted max HR)  BP rest: 124/76 BP peak: 158/70 Peak VO2: 16.8 (67% predicted peak VO2) VE/VCO2 slope: 48 OUES: 0.71 Peak RER: 1.07 Ventilatory Threshold: 12.7 (50% predicted and 76% measured peak VO2) VE/MVV: 51% O2pulse: 7  (100% predicted O2pulse)   RHC/LHC 03/16/2017   Mid LAD-1 lesion is 40% stenosed.  Mid LAD-2 lesion is 95% stenosed.  Ost 2nd Diag to 2nd Diag lesion is 50% stenosed.  Ost Cx to Prox Cx lesion is 60% stenosed.  1st Mrg lesion is 100% stenosed.  Prox RCA lesion is 90% stenosed.  Mid RCA lesion is  30% stenosed.  Post Atrio lesion is 100% stenosed.  Dist RCA lesion is 100% stenosed.  Mid Cx lesion is 95% stenosed.  Dist Cx lesion is 60% stenosed.  Findings: Ao = 142/80 (105) LV = 158/25 RA = 1 RV = 29/2 PA = 30/19 (20) PCW = 20 Fick cardiac output/index = 2.4/1.7 PVR = 5.0 WU SVR = 3452  FA sat = 97% PA sat = 57%, 58% Assessment: 1. Severe 3v CAD unchanged from previous. No obvious culprit lesion for NSTEMI 2. Severe ischemic CM with EF 15% and low output physiology    PMHx:  . Allergic rhinitis 08/15/2016  . Chronic systolic heart failure ([place]) 08/15/2016  . Malignant neoplasm of overlapping sites of left female breast ([place]) 10/19/2015      Current Outpatient Medications  Medication Sig Dispense Refill  . acetaminophen (TYLENOL) 500 MG tablet Take 1,000 mg by mouth daily as needed for moderate pain or headache.     Marland Kitchen aspirin 81 MG chewable tablet Chew 1 tablet (81 mg total) by mouth daily. 90 tablet 4  . digoxin (LANOXIN) 0.125 MG tablet TAKE 1/2 TABLETS (0.0625 MG TOTAL) BY MOUTH DAILY. 45 tablet 3  . diphenhydrAMINE (BENADRYL) 25 mg capsule Take 25 mg by mouth every 6 (six) hours as needed for allergies.    . furosemide (LASIX) 20 MG tablet Take 20 mg by mouth 2 (two) times daily  as needed for fluid or edema.     . ivabradine (CORLANOR) 5 MG TABS tablet TAKE 1 TABLET (5 MG TOTAL) BY MOUTH 2 (TWO) TIMES DAILY WITH A MEAL. 60 tablet 6  . midodrine (PROAMATINE) 5 MG tablet Take 0.5 tablets (2.5 mg total) by mouth 3 (three) times daily with meals. 90 tablet 6  . nitroGLYCERIN (NITROSTAT) 0.4 MG SL tablet Place 1 tablet (0.4 mg total) under the tongue every 5 (five) minutes as needed for chest pain. 25 tablet 3  . ondansetron (ZOFRAN) 4 MG tablet Take 4 mg by mouth every 4 (four) hours as needed for nausea/vomiting.  2  . oxymetazoline (AFRIN) 0.05 % nasal spray Place 1 spray into both nostrils at bedtime as needed for congestion.    . potassium chloride  (KLOR-CON) 10 MEQ tablet Take 1 tablet (10 mEq total) by mouth 2 (two) times daily. 60 tablet 0  . Probiotic Product (PROBIOTIC ADVANCED PO) Take 1 capsule by mouth daily.      No current facility-administered medications for this encounter.    Allergies  Allergen Reactions  . Chantix [Varenicline] Nausea And Vomiting  . Codeine Other (See Comments)    GI Upset, headaches        Social History   Socioeconomic History  . Marital status: Divorced    Spouse name: Not on file  . Number of children: Not on file  . Years of education: Not on file  . Highest education level: Not on file  Occupational History  . Not on file  Tobacco Use  . Smoking status: Light Tobacco Smoker    Years: 38.00    Types: Cigarettes  . Smokeless tobacco: Never Used  Vaping Use  . Vaping Use: Never used  Substance and Sexual Activity  . Alcohol use: No  . Drug use: No  . Sexual activity: Not Currently  Other Topics Concern  . Not on file  Social History Narrative  . Not on file   Social Determinants of Health   Financial Resource Strain: Not on file  Food Insecurity: Not on file  Transportation Needs: Not on file  Physical Activity: Not on file  Stress: Not on file  Social Connections: Not on file  Intimate Partner Violence: Not on file   Family History   . Heart disease Mother - + CAD with CABG at age 65 . Kidney cancer Mother  . Heart disease Father  - CABG and AVR in 43s . Breast cancer Paternal Aunt  . Breast cancer Cousin  . Colon cancer Neg Hx    - 3 sisters - no CAD   Vitals:   03/20/20 1152  BP: 120/80  Pulse: 90  SpO2: 97%  Weight: 48 kg (105 lb 12.8 oz)   Wt Readings from Last 3 Encounters:  01/29/20 48 kg (105 lb 12.8 oz)  10/03/19 48.5 kg (107 lb)  07/04/19 48.2 kg (106 lb 3.2 oz)    PHYSICAL EXAM: General:  Thin Well appearing. No resp difficulty HEENT: normal Neck: supple. no JVD. Carotids 2+ bilat; no bruits. No lymphadenopathy or thryomegaly  appreciated. Cor: PMI nondisplaced. Regular rate & rhythm. No rubs, gallops or murmurs. Lungs: decreased throughout Abdomen: soft, nontender, nondistended. No hepatosplenomegaly. No bruits or masses. Good bowel sounds. Extremities: no cyanosis, clubbing, rash, edema Neuro: alert & orientedx3, cranial nerves grossly intact. moves all 4 extremities w/o difficulty. Affect pleasant   ASSESSMENT & PLAN: 1. Chronic systolic HF - [id number] .  ECHO EF 20-25%.  -  08/15/18  Echo EF 20-25% - Echo 10/03/19 EF 20-25% RV ok  - LHC with severe 3 vessel disease. Poor candidate for CABG or PCI due to diffuse CAD -Stable NYHA III-IIB Volume status ok. Off lasix. - No bb with low output.  - Has been off losartan due to hypotension (failed rechallenge) and off lasix/spiro due to volume depletion.  - Continue digoxin  - Increase ivabradine to 7.5 bit - She does not want to try SGLT2i at this point. Can revisit in future.  - CPX reveals moderate to severe HF limitation with very high VEVCO2 slope. Spirometry not too bad - Failed milrinone attempt in 1/19 due to worsening tachycardia - Not candidate for advanced therapies and has failed inotropes. We offered her an evaluation at [facility] for 2nd opinion but she has declined.  2.Orthostatic hypotension -Stable/resolved 3. 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 S/p Adjuvant chemotherapy followed by left mastectomy and LN dissection 2/18 and XRT. Now complete. - No change to current plan.   4. Tobacco use - Discussed need for smoking cessation  5. Pulmonary Nodules - saw Dr Isaiah Serge. CTwith scattered nodules. Thought to be non specific inflammation.   6. CAD - s/p NSTEM 1/19/ LHC 03/2017  Severe 3v CAD unchanged from previous. No obvious culprit lesion for NSTEMI - Having intermittent left neck pain. Not clearly anginal but asked her to let me know if this progresses.  - continue ASA/statin - Continue prn NTG.     Arvilla Meres, MD  9:11 PM

## 2020-03-20 ENCOUNTER — Other Ambulatory Visit: Payer: Self-pay

## 2020-03-20 ENCOUNTER — Ambulatory Visit (HOSPITAL_COMMUNITY)
Admission: RE | Admit: 2020-03-20 | Discharge: 2020-03-20 | Disposition: A | Discharge status: Home / Self Care | Payer: Medicare Other | Source: Ambulatory Visit | Attending: Internal Medicine | Admitting: Internal Medicine

## 2020-03-20 ENCOUNTER — Encounter (HOSPITAL_COMMUNITY): Payer: Self-pay | Admitting: Internal Medicine

## 2020-03-20 VITALS — BP 120/80 | HR 90 | Wt 105.8 lb

## 2020-03-20 DIAGNOSIS — Z800 Family history of malignant neoplasm of digestive organs: Secondary | ICD-10-CM | POA: Diagnosis not present

## 2020-03-20 DIAGNOSIS — R918 Other nonspecific abnormal finding of lung field: Secondary | ICD-10-CM | POA: Insufficient documentation

## 2020-03-20 DIAGNOSIS — I2510 Atherosclerotic heart disease of native coronary artery without angina pectoris: Secondary | ICD-10-CM | POA: Insufficient documentation

## 2020-03-20 DIAGNOSIS — I255 Ischemic cardiomyopathy: Secondary | ICD-10-CM | POA: Insufficient documentation

## 2020-03-20 DIAGNOSIS — Z9221 Personal history of antineoplastic chemotherapy: Secondary | ICD-10-CM | POA: Diagnosis not present

## 2020-03-20 DIAGNOSIS — Z8249 Family history of ischemic heart disease and other diseases of the circulatory system: Secondary | ICD-10-CM | POA: Insufficient documentation

## 2020-03-20 DIAGNOSIS — Z720 Tobacco use: Secondary | ICD-10-CM

## 2020-03-20 DIAGNOSIS — Z8051 Family history of malignant neoplasm of kidney: Secondary | ICD-10-CM | POA: Diagnosis not present

## 2020-03-20 DIAGNOSIS — Z803 Family history of malignant neoplasm of breast: Secondary | ICD-10-CM | POA: Diagnosis not present

## 2020-03-20 DIAGNOSIS — F17210 Nicotine dependence, cigarettes, uncomplicated: Secondary | ICD-10-CM | POA: Insufficient documentation

## 2020-03-20 DIAGNOSIS — Z7982 Long term (current) use of aspirin: Secondary | ICD-10-CM | POA: Diagnosis not present

## 2020-03-20 DIAGNOSIS — Z885 Allergy status to narcotic agent status: Secondary | ICD-10-CM | POA: Diagnosis not present

## 2020-03-20 DIAGNOSIS — I951 Orthostatic hypotension: Secondary | ICD-10-CM | POA: Insufficient documentation

## 2020-03-20 DIAGNOSIS — Z853 Personal history of malignant neoplasm of breast: Secondary | ICD-10-CM | POA: Insufficient documentation

## 2020-03-20 DIAGNOSIS — I5022 Chronic systolic (congestive) heart failure: Secondary | ICD-10-CM | POA: Diagnosis not present

## 2020-03-20 DIAGNOSIS — I428 Other cardiomyopathies: Secondary | ICD-10-CM | POA: Diagnosis not present

## 2020-03-20 DIAGNOSIS — Z888 Allergy status to other drugs, medicaments and biological substances status: Secondary | ICD-10-CM | POA: Diagnosis not present

## 2020-03-20 DIAGNOSIS — Z79899 Other long term (current) drug therapy: Secondary | ICD-10-CM | POA: Diagnosis not present

## 2020-03-20 LAB — BASIC METABOLIC PANEL
Anion gap: 12 (ref 5–15)
BUN: 11 mg/dL (ref 8–23)
CO2: 24 mmol/L (ref 22–32)
Calcium: 9.3 mg/dL (ref 8.9–10.3)
Chloride: 101 mmol/L (ref 98–111)
Creatinine, Ser: 0.7 mg/dL (ref 0.44–1.00)
GFR, Estimated: >60 mL/min (ref >60)
Glucose, Bld: 83 mg/dL (ref 70–99)
Potassium: 3.9 mmol/L (ref 3.5–5.1)
Sodium: 137 mmol/L (ref 135–145)

## 2020-03-20 LAB — BRAIN NATRIURETIC PEPTIDE: B Natriuretic Peptide: 350.5 pg/mL — ABNORMAL HIGH (ref 0.0–100.0)

## 2020-03-20 LAB — DIGOXIN LEVEL: Digoxin Level: 0.3 ng/mL — ABNORMAL LOW (ref 0.8–2.0)

## 2020-03-20 MED ORDER — IVABRADINE HCL 7.5 MG PO TABS: 7.5000 mg | ORAL_TABLET | Freq: Two times a day (BID) | ORAL | 3 refills | Status: DC

## 2020-03-20 MED ORDER — IVABRADINE HCL 7.5 MG PO TABS: 7.5000 mg | ORAL_TABLET | Freq: Two times a day (BID) | ORAL | 6 refills | Status: DC

## 2020-03-20 NOTE — Patient Instructions (Signed)
Increase Corlanor to 7.5 mg Twice daily   Labs done today, your results will be available in MyChart, we will contact you for abnormal readings.  Please call our office in June 2022 to schedule your follow up appointment  If you have any questions or concerns before your next appointment please send [place] a message through [place] or call our office at [phone].    TO LEAVE A MESSAGE FOR THE NURSE SELECT OPTION 2, PLEASE LEAVE A MESSAGE INCLUDING: . YOUR NAME . DATE OF BIRTH . CALL BACK NUMBER . REASON FOR CALL**this is important as we prioritize the call backs  Jay AS LONG AS YOU CALL BEFORE 4:00 PM  At the [facility], you and your health needs are our priority. As part of our continuing mission to provide you with exceptional heart care, we have created designated Provider Care Teams. These Care Teams include your primary Cardiologist (physician) and Advanced Practice Providers (APPs- Physician Assistants and Nurse Practitioners) who all work together to provide you with the care you need, when you need it.   You may see any of the following providers on your designated Care Team at your next follow up: Marland Kitchen Dr [name] . Dr Loralie Champagne . Darrick Grinder, NP . Lyda Jester, PA . Audry Riles, PharmD   Please be sure to bring in all your medications bottles to every appointment.

## 2020-04-08 ENCOUNTER — Telehealth (HOSPITAL_COMMUNITY): Payer: Self-pay | Admitting: Pharmacist

## 2020-04-08 ENCOUNTER — Telehealth (HOSPITAL_COMMUNITY): Payer: Self-pay | Admitting: Pharmacy Technician

## 2020-04-08 NOTE — Telephone Encounter (Signed)
Spoke with patient today regarding [employer] assistance. Emailed her the application to sign and send back.  Will fax in once all signatures are obtained.

## 2020-04-08 NOTE — Telephone Encounter (Signed)
Sent in Manufacturer's Assistance application to [employer] for Corlanor.    Application pending, will continue to follow.   Jacey Eckerson, PharmD, BCPS, BCCP, CPP Heart Failure Clinic Pharmacist [phone]  

## 2020-04-17 NOTE — Telephone Encounter (Signed)
Advanced Heart Failure Patient Advocate Encounter   Patient was approved to receive Corlanor from [facility]  Patient ID: [id number] Effective dates: 07/15/20 through 03/13/21  Spoke with patient.   Charlann Boxer, CPhT

## 2020-04-24 ENCOUNTER — Telehealth: Payer: Self-pay

## 2020-04-24 NOTE — Telephone Encounter (Signed)
Patient states the when she moves her left arm a certain way, she has excructiating pain at her mastectomy site.  This has been going on intermittently for a month.  Has gotten worse over the last week. [phone].

## 2020-04-27 ENCOUNTER — Other Ambulatory Visit: Payer: Self-pay

## 2020-04-27 ENCOUNTER — Telehealth: Payer: Self-pay | Admitting: Oncology

## 2020-04-27 ENCOUNTER — Encounter: Payer: Self-pay | Admitting: Oncology

## 2020-04-27 ENCOUNTER — Other Ambulatory Visit: Payer: Self-pay | Admitting: Hematology and Oncology

## 2020-04-27 ENCOUNTER — Other Ambulatory Visit: Payer: Self-pay | Admitting: Oncology

## 2020-04-27 ENCOUNTER — Inpatient Hospital Stay: Payer: Medicare Other | Attending: Hematology and Oncology | Admitting: Oncology

## 2020-04-27 ENCOUNTER — Inpatient Hospital Stay: Payer: Medicare Other

## 2020-04-27 VITALS — BP 130/75 | HR 86 | Temp 98.2°F | Resp 18 | Ht 62.5 in | Wt 108.3 lb

## 2020-04-27 DIAGNOSIS — C50812 Malignant neoplasm of overlapping sites of left female breast: Secondary | ICD-10-CM

## 2020-04-27 DIAGNOSIS — Z171 Estrogen receptor negative status [ER-]: Secondary | ICD-10-CM

## 2020-04-27 LAB — BASIC METABOLIC PANEL
BUN: 10 (ref 4–21)
CO2: 27 — AB (ref 13–22)
Chloride: 101 (ref 99–108)
Creatinine: 0.7 (ref 0.5–1.1)
Potassium: 3.9 (ref 3.4–5.3)
Sodium: 135 — AB (ref 137–147)

## 2020-04-27 LAB — COMPREHENSIVE METABOLIC PANEL: Calcium: 9.3 (ref 8.7–10.7)

## 2020-04-27 NOTE — Telephone Encounter (Signed)
Per 2/14 los next appt sched and given to patient 

## 2020-04-27 NOTE — Telephone Encounter (Signed)
Per 2/14 Staff Msg, patient is experiencing Chest Pain - Patient scheduled for today at 2:00 pm

## 2020-04-27 NOTE — Progress Notes (Signed)
[place]  [address] [place],  [place]  [address] [phone]  Clinic Day:  04/27/2020  Referring physician: [name], MD  This document serves as a record of services personally performed by [name], MD. It was created on their behalf by Curry,Lauren E, a trained medical scribe. The creation of this record is based on the scribe's personal observations and the provider's statements to them.  CHIEF COMPLAINT:  CC: Clinical stage IIIB triple negative left breast cancer  Current Treatment:  Observation   HISTORY OF PRESENT ILLNESS:  Robin Rose is a 65 y.o. female with history of clinical stage IIIB (T4b N1 M0) triple negative left breast cancer diagnosed in August 2017.  She felt a large area of firmness in the left lower outer quadrant for approximately 6 weeks and noticed nipple inversion.  She thought it was a muscle spasm.  The area was also pruritic.  She was seen by Dr. Henrene Pastor in July, who scheduled a diagnostic mammogram.  This revealed 3 lesions in the left breast, one at 1 o'clock measuring 1.4 cm, another at 2 o'clock measuring 1.9 cm and a 3rd at 12 o'clock measuring 4.4 cm behind the nipple.  Ultrasound confirmed these lesions and she was also found to have another lesion at 6:30 o'clock,  measuring 3 cm, as well as multiple pathologically enlarged nodes of the left axilla.  She had multiple biopsies in early August.  Pathology revealed the lesion at 2 o'clock to be a grade 3 invasive ductal carcinoma with ductal carcinoma in situ in multiple cores, and the lesion at 6:30 o'clock to be a grade 3, invasive ductal carcinoma with ductal carcinoma in situ in multiple cores.  Biopsy of a left axillary lymph node revealed high-grade metastatic carcinoma.  Estrogen and progesterone receptors were negative and her 2 Neu negative.  Ki 67 was 30%.  Due to her personal and family history of breast cancer, she underwent testing  for hereditary breast and ovarian cancer with the [facility] Cancer Panel test.  This did not reveal any clinically significant mutation or variants of uncertain significance.  She received neoadjuvant TAC chemotherapy, along with Neulasta to prevent complications of prolonged neutropenia.  She had significant toxicities secondary to chemotherapy including severe nausea, vomiting and diarrhea.  She was given fluids, antiemetics, and IV potassium. She was also given Zyprexa $RemoveBefor'5mg'JaalBvLTDoIq$  at bedtime for the nausea.  She was then found to have C. difficile infection, which caused delay in her second cycle of chemotherapy.  She completed 6 cycles of chemotherapy.  She then underwent left mastectomy in February 2018.  Pathology revealed multicentric invasive ductal carcinoma, grade 3, with foci of ductal carcinoma in situ.  The largest focus of invasive carcinoma measured approximately 10 mm, and 2/5 lymph nodes had isolated tumor cells.  Therefore, her disease was downstaged to a pathologic stage IA (ypT1b ypN0).   She received adjuvant radiation to the left chest wall and axilla, completed in April 2018.  Prior to starting radiation, she was felt to have pleural effusion on her radiation simulation CT, so she underwent CT chest and abdomen.  This revealed trace bilateral pleural effusions, a postop seroma in the left axilla, and age advanced 3 vessel coronary artery disease, but no evidence of recurrent or metastatic disease.  She had chronic nausea since surgery.  She was referred to occupational therapy for lymphedema of the left upper extremity and was given a compression sleeve to  wear.  She had physical therapy for reconditioning.  She was seen in May 2018 with increasing dyspnea.  CT angiogram was negative for pulmonary emboli or recurrent cancer,  but she did have a mild pleural effusion.  Within 2 days, she was admitted to the hospital with respiratory distress and transferred to the intensive care unit.  She was  found to be in congestive heart failure with bilateral pleural effusions and placed on diuretics, as well as digoxin and lisinopril.  Echocardiogram showed impaired left ventricular function with an ejection fraction between 20-25% and left atrial dilatation with mild mitral regurgitation.  MUGA scan in January 2018 had shown a slight decrease of her EF from 64% to 57%.  We discussed the fact that this may have been a combination of the radiation and chemotherapy.  However, she was found to have severe coronary artery disease.   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  She refuses MRI scans.  Her port was removed in November 2018.  On March 14, 2017, she had an acute myocardial infarction, but recovered fairly well. She continues to follow with [place] every 6 months and Dr. Noberto Retort yearly in the summer.  Right screening mammogram in July did not reveal any evidence of malignancy.   INTERVAL HISTORY:  Phyllistine has been added onto the schedule today due to pain of her mastectomy site, from the left axilla and running down her left flank.  She described this as a muscle spasm and is intense at a 10/10 when it occurs, but she is not in any pain now.  She denies any repetitive motions or recent injury.  We will investigate further with imaging.  Her  appetite is good, and she has lost 2 and 1/2 pounds since her last visit.  She denies fever, chills or other signs of infection.  She denies nausea, vomiting, bowel issues, or abdominal pain.  She denies sore throat, cough, dyspnea, or chest pain.  REVIEW OF SYSTEMS:  Review of Systems  Constitutional: Negative.   HENT:  Negative.   Eyes: Negative.   Respiratory: Negative.   Cardiovascular: Negative.   Gastrointestinal: Negative.   Endocrine: Negative.   Genitourinary: Negative.    Musculoskeletal: Positive  for myalgias (of the left mastectomy from the axilla running down the left flank).  Skin: Negative.   Neurological: Negative.   Hematological: Negative.   Psychiatric/Behavioral: Negative.      VITALS:  Blood pressure 130/75, pulse 86, temperature 98.2 F (36.8 C), resp. rate 18, height 5' 2.5" (1.588 m), weight 108 lb 4.8 oz (49.1 kg), SpO2 97 %.  Wt Readings from Last 3 Encounters:  04/27/20 108 lb 4.8 oz (49.1 kg)  03/20/20 105 lb 12.8 oz (48 kg)  01/29/20 105 lb 12.8 oz (48 kg)    Body mass index is 19.49 kg/m.  Performance status (ECOG): 1 - Symptomatic but completely ambulatory  PHYSICAL EXAM:  Physical Exam Constitutional:      General: She is not in acute distress.    Appearance: Normal appearance. She is normal weight.  HENT:     Head: Normocephalic and atraumatic.  Eyes:     General: No scleral icterus.    Extraocular Movements: Extraocular movements intact.     Conjunctiva/sclera: Conjunctivae normal.     Pupils: Pupils are equal, round, and reactive to light.  Cardiovascular:     Rate and Rhythm: Normal rate and regular rhythm.     Pulses: Normal pulses.     Heart sounds: Normal heart sounds. No murmur heard. No friction rub. No gallop.   Pulmonary:     Effort: Pulmonary effort is normal. No respiratory distress.     Breath sounds: Normal breath sounds.  Chest:     Comments: She has mild soft tissue swelling of the axillary tail of the left breast.  She has tendons in the left axilla that are very tight and there is a lot of scar tissue.  Left mastectomy is negative for evidence of recurrence.  She has tenderness of the left lateral ribcage on firm palpation.  Right breast is negative. Abdominal:     General: Bowel sounds are normal. There is no distension.     Palpations: Abdomen is soft. There is no mass.     Tenderness: There is no abdominal tenderness.  Musculoskeletal:        General: Normal range of motion.     Cervical back: Normal range of motion  and neck supple.     Right lower leg: No edema.     Left lower leg: No edema.  Lymphadenopathy:     Cervical: No cervical adenopathy.  Skin:    General: Skin is warm and dry.  Neurological:     General: No focal deficit present.     Mental Status: She is alert and oriented to person, place, and time. Mental status is at baseline.  Psychiatric:        Mood and Affect: Mood normal.        Behavior: Behavior normal.        Thought Content: Thought content normal.        Judgment: Judgment normal.    LABS:   CBC Latest Ref Rng & Units 02/19/2020 01/29/2020 10/03/2019  WBC - 9.3 9.1 7.7  Hemoglobin 12.0 - 16.0 17.2(A) 16.3(H) 16.2(H)  Hematocrit 36 - 46 50(A) 47.8(H) 48.0(H)  Platelets 150 - 399 220 249 258   CMP Latest Ref Rng & Units 03/20/2020 02/19/2020 01/29/2020  Glucose 70 - 99 mg/dL 83 - 109(H)  BUN 8 - 23 mg/dL 11 11 10   Creatinine 0.44 - 1.00 mg/dL 0.70 0.6 0.66  Sodium 135 - 145 mmol/L 137 137 137  Potassium 3.5 - 5.1 mmol/L 3.9 4.0 3.1(L)  Chloride 98 - 111 mmol/L 101 101 99  CO2 22 - 32 mmol/L 24 26(A) 32  Calcium 8.9 - 10.3 mg/dL 9.3 10.0 9.2  Total Protein 6.5 - 8.1 g/dL - - 7.3  Total Bilirubin 0.3 - 1.2 mg/dL - - 0.5  Alkaline Phos 25 - 125 - 68 56  AST 13 - 35 - 27 19  ALT 7 - 35 - 20 20    STUDIES:  No results found.   Allergies:  Allergies  Allergen Reactions  . Chantix [Varenicline] Nausea And Vomiting  . Codeine Other (See Comments)    GI Upset, headaches      Current Medications: Current Outpatient Medications  Medication Sig Dispense Refill  . acetaminophen (TYLENOL) 500 MG tablet Take 1,000 mg by mouth daily as needed for moderate pain or headache.     Marland Kitchen aspirin 81 MG chewable tablet Chew 1 tablet (81 mg total) by mouth daily. 90 tablet 4  . digoxin (LANOXIN) 0.125 MG tablet TAKE 1/2 TABLETS (0.0625 MG TOTAL) BY MOUTH DAILY. 45 tablet 3  . diphenhydrAMINE (BENADRYL) 25 mg capsule  Take 25 mg by mouth every 6 (six) hours as needed for allergies.     . furosemide (LASIX) 20 MG tablet Take 20 mg by mouth 2 (two) times daily as needed for fluid or edema.     . ivabradine (CORLANOR) 7.5 MG TABS tablet Take 1 tablet (7.5 mg total) by mouth 2 (two) times daily with a meal. 180 tablet 3  . midodrine (PROAMATINE) 5 MG tablet Take 0.5 tablets (2.5 mg total) by mouth 3 (three) times daily with meals. 90 tablet 6  . nitroGLYCERIN (NITROSTAT) 0.4 MG SL tablet Place 1 tablet (0.4 mg total) under the tongue every 5 (five) minutes as needed for chest pain. 25 tablet 3  . ondansetron (ZOFRAN) 4 MG tablet Take 4 mg by mouth every 4 (four) hours as needed for nausea/vomiting.  2  . oxymetazoline (AFRIN) 0.05 % nasal spray Place 1 spray into both nostrils at bedtime as needed for congestion.    . potassium chloride (KLOR-CON) 10 MEQ tablet Take 1 tablet (10 mEq total) by mouth 2 (two) times daily. (Patient taking differently: Take 10 mEq by mouth 2 (two) times daily. As needed when taking furosemide) 60 tablet 0  . Probiotic Product (PROBIOTIC ADVANCED PO) Take 1 capsule by mouth daily.      No current facility-administered medications for this visit.     ASSESSMENT & PLAN:   Assessment:   1. Stage IIIB triple negative left breast cancer treated with aggressive neoadjuvant chemotherapy, followed by mastectomy and postmastectomy radiation.  She remains without evidence of recurrence.  She will continue observation.  2. Significant coronary artery disease and congestive heart failure, as well as history of myocardial infarction.  She is doing well at this time.   3. Mild elevation of the hemoglobin and hematocrit, intermittently since July 2019, which is stable and we will continue to monitor.  4. Hypokalemia.  She was given potassium chloride 20 mEq twice daily.  Her potassium is normal at 3.9 today at this dose.  5.  Significant pain and spasms of the left mastectomy/axilla.  We will obtain CT chest as she does have tenderness of the left ribcage on  palpation.  I recommend that she consider physical therapy and we will make the appropriate referral.  We both agree with avoiding muscle relaxants for now.  Plan: She recently developed intermittent pain of the left mastectomy/axilla which radiates down her left flank.  I am concerned as she has tenderness of the left ribcage on palpation and so we will obtain CT chest imaging for further evaluation.  We will obtain a BMP today.  I do not feel that it is likely that this is recurrence of her cancer, but she did have a high risk triple negative lesion.  I do recommend that she consider physical therapy and we will make the appropriate referral.  Otherwise, we will have her keep her appointment in May as scheduled.  The patient understands the plans discussed today and is in agreement with them.  She knows to contact our office if she develops concerns prior to her next appointment.   I provided 15 minutes of face-to-face time during this this encounter and > 50% was spent counseling as documented under my assessment and plan.    Derwood Kaplan, MD [facility] AT [facility] [address] [place] [address] Dept: [phone] Dept Fax: [phone]   I, Rita Ohara, am acting as scribe for Derwood Kaplan,  MD  I have reviewed this report as typed by the medical scribe, and it is complete and accurate.  Hermina Barters

## 2020-04-28 DIAGNOSIS — Z853 Personal history of malignant neoplasm of breast: Secondary | ICD-10-CM | POA: Diagnosis not present

## 2020-04-28 DIAGNOSIS — I2510 Atherosclerotic heart disease of native coronary artery without angina pectoris: Secondary | ICD-10-CM | POA: Diagnosis not present

## 2020-04-28 DIAGNOSIS — J432 Centrilobular emphysema: Secondary | ICD-10-CM | POA: Diagnosis not present

## 2020-04-28 DIAGNOSIS — J181 Lobar pneumonia, unspecified organism: Secondary | ICD-10-CM | POA: Diagnosis not present

## 2020-04-28 DIAGNOSIS — C50812 Malignant neoplasm of overlapping sites of left female breast: Secondary | ICD-10-CM | POA: Diagnosis not present

## 2020-04-28 DIAGNOSIS — R229 Localized swelling, mass and lump, unspecified: Secondary | ICD-10-CM | POA: Diagnosis not present

## 2020-05-01 ENCOUNTER — Telehealth: Payer: Self-pay

## 2020-05-01 NOTE — Telephone Encounter (Signed)
Patient notified

## 2020-05-01 NOTE — Telephone Encounter (Signed)
-----   Message from Derwood Kaplan, MD sent at 04/29/2020  1:18 PM EST ----- Regarding: call pt Tell her CT shows no sign of cancer or other problems.  I do see a band of scar tissue along the lung lining in that area caused by the radiation and I think that is what she is feeling

## 2020-05-05 DIAGNOSIS — R0789 Other chest pain: Secondary | ICD-10-CM | POA: Diagnosis not present

## 2020-05-05 DIAGNOSIS — M6281 Muscle weakness (generalized): Secondary | ICD-10-CM | POA: Diagnosis not present

## 2020-05-05 DIAGNOSIS — C50812 Malignant neoplasm of overlapping sites of left female breast: Secondary | ICD-10-CM | POA: Diagnosis not present

## 2020-05-05 DIAGNOSIS — M25512 Pain in left shoulder: Secondary | ICD-10-CM | POA: Diagnosis not present

## 2020-05-05 DIAGNOSIS — M25612 Stiffness of left shoulder, not elsewhere classified: Secondary | ICD-10-CM | POA: Diagnosis not present

## 2020-05-12 DIAGNOSIS — R0789 Other chest pain: Secondary | ICD-10-CM | POA: Diagnosis not present

## 2020-05-12 DIAGNOSIS — M25612 Stiffness of left shoulder, not elsewhere classified: Secondary | ICD-10-CM | POA: Diagnosis not present

## 2020-05-12 DIAGNOSIS — M25512 Pain in left shoulder: Secondary | ICD-10-CM | POA: Diagnosis not present

## 2020-05-12 DIAGNOSIS — M6281 Muscle weakness (generalized): Secondary | ICD-10-CM | POA: Diagnosis not present

## 2020-05-12 DIAGNOSIS — C50812 Malignant neoplasm of overlapping sites of left female breast: Secondary | ICD-10-CM | POA: Diagnosis not present

## 2020-05-19 DIAGNOSIS — C50812 Malignant neoplasm of overlapping sites of left female breast: Secondary | ICD-10-CM | POA: Diagnosis not present

## 2020-05-19 DIAGNOSIS — M25512 Pain in left shoulder: Secondary | ICD-10-CM | POA: Diagnosis not present

## 2020-05-19 DIAGNOSIS — R0789 Other chest pain: Secondary | ICD-10-CM | POA: Diagnosis not present

## 2020-05-19 DIAGNOSIS — M6281 Muscle weakness (generalized): Secondary | ICD-10-CM | POA: Diagnosis not present

## 2020-05-19 DIAGNOSIS — M25612 Stiffness of left shoulder, not elsewhere classified: Secondary | ICD-10-CM | POA: Diagnosis not present

## 2020-05-21 ENCOUNTER — Other Ambulatory Visit (HOSPITAL_COMMUNITY): Payer: Self-pay | Admitting: *Deleted

## 2020-05-21 MED ORDER — FUROSEMIDE 20 MG PO TABS
ORAL_TABLET | ORAL | 3 refills | Status: DC
Start: 2020-05-21 — End: 2020-05-29

## 2020-05-27 ENCOUNTER — Telehealth (HOSPITAL_COMMUNITY): Payer: Self-pay | Admitting: Cardiology

## 2020-05-27 NOTE — Telephone Encounter (Signed)
   Needs to take an extra 20 mg lasix today. Then tomorrow will need to take lasix 40 mg daily.   Check BMET next week.   Sarra Rachels NP-C  4:32 PM

## 2020-05-27 NOTE — Telephone Encounter (Signed)
Patient called with concerns regarding increaed SOB Reports she is unable to lie flat at night Currently taking  PRN lasix 20 mg daily (despite instructions 20 mg BID-reports she cannot take BIS as that causes increased dizziness and drunk feeling)  Weight normally 107-110 Weight today 108  Denies recent medication changes and or lifestyle changes Denies CP or swelling  Please advise

## 2020-05-27 NOTE — Telephone Encounter (Signed)
Pt aware and voiced understanding 

## 2020-05-29 ENCOUNTER — Encounter (HOSPITAL_COMMUNITY): Payer: Self-pay

## 2020-05-29 ENCOUNTER — Other Ambulatory Visit: Payer: Self-pay

## 2020-05-29 ENCOUNTER — Ambulatory Visit (HOSPITAL_COMMUNITY)
Admission: RE | Admit: 2020-05-29 | Discharge: 2020-05-29 | Disposition: A | Discharge status: Home / Self Care | Payer: Medicare Other | Source: Ambulatory Visit | Attending: Cardiology | Admitting: Cardiology

## 2020-05-29 ENCOUNTER — Telehealth (HOSPITAL_COMMUNITY): Payer: Self-pay | Admitting: *Deleted

## 2020-05-29 VITALS — BP 124/78 | HR 95 | Wt 106.6 lb

## 2020-05-29 DIAGNOSIS — I700 Atherosclerosis of aorta: Secondary | ICD-10-CM | POA: Diagnosis not present

## 2020-05-29 DIAGNOSIS — R0600 Dyspnea, unspecified: Secondary | ICD-10-CM | POA: Diagnosis not present

## 2020-05-29 DIAGNOSIS — Q676 Pectus excavatum: Secondary | ICD-10-CM | POA: Diagnosis not present

## 2020-05-29 DIAGNOSIS — I444 Left anterior fascicular block: Secondary | ICD-10-CM | POA: Insufficient documentation

## 2020-05-29 DIAGNOSIS — R0602 Shortness of breath: Secondary | ICD-10-CM | POA: Diagnosis not present

## 2020-05-29 LAB — BASIC METABOLIC PANEL
Anion gap: 8 (ref 5–15)
BUN: 11 mg/dL (ref 8–23)
CO2: 30 mmol/L (ref 22–32)
Calcium: 9.6 mg/dL (ref 8.9–10.3)
Chloride: 99 mmol/L (ref 98–111)
Creatinine, Ser: 0.76 mg/dL (ref 0.44–1.00)
GFR, Estimated: >60 mL/min (ref >60)
Glucose, Bld: 99 mg/dL (ref 70–99)
Potassium: 4.1 mmol/L (ref 3.5–5.1)
Sodium: 137 mmol/L (ref 135–145)

## 2020-05-29 LAB — TROPONIN I (HIGH SENSITIVITY): Troponin I (High Sensitivity): 14 ng/L (ref <18)

## 2020-05-29 NOTE — Progress Notes (Signed)
ReDS Vest / Clip - 05/29/20 1300      ReDS Vest / Clip   Station Marker A    Ruler Value 24    ReDS Value Range Low volume    ReDS Actual Value 24

## 2020-05-29 NOTE — Telephone Encounter (Signed)
Spoke w/pt, she reports she has had increased SOB for the past week, it occurs with exertion and at rest sometimes. She states she started taking her Lasix 20 mg Daily and took extra yesterday but it has not really helped her SOB.  Per Dr Haroldine Laws bring pt in for RN visit w/ReDS clip, EKG, and chest xray.   Pt agreeable and visit schedule

## 2020-05-29 NOTE — Telephone Encounter (Signed)
received 2nd vm from pt requesting a call back from [facility]. I returned pts call yesterday and she said she would like to speak with [facility] directly.

## 2020-05-29 NOTE — Progress Notes (Signed)
Patient seen as a nurse visit for increasing SOB for approximately a week . Patient denies any chest pain. No L/E noted. Reds clip 24%. EKG done. CXR done.

## 2020-05-29 NOTE — Progress Notes (Signed)
Pt reports increased SOB since last Wed, she has been taking Lasix 20 mg Daily since that time, she took 40 mg yesterday and was extremely dizzy all day. Pt denies CP/palps/edema. Discussed w/Dr Bensimhon, labs ordered. Advised pt continue Lasix 20 mg Daily over the weekend and we will touch base on Monday to see how she is feeling. If symptoms worsen over weekend pt advised to report to ED.

## 2020-06-02 ENCOUNTER — Other Ambulatory Visit (HOSPITAL_COMMUNITY): Payer: Self-pay | Admitting: *Deleted

## 2020-06-02 NOTE — Progress Notes (Signed)
Advanced Heart Failure Clinic Note    Referring Physician: Gwenlyn Found  Primary Care: Dr. Garlon Hatchet  Primary Cardiologist: Gwenlyn Found   HPI: Robin Rose is a 65 y.o. female with h/o tobacco abuse, breast cancer (s/p chemo and left mastectomy) and systolic HF.    In 8/17 she was diagnosed with triple-negative breast cancer with lymph node involvement. She underwent neoadjuvant chemotherapy including adriamycin (6 cycles)  followed by left mastectomy in 2/18 and XRT (completed 4/18).   Her EF 11/05/15 was 55% and by MUGA 04/06/16 was 69%. She was admitted to [facility] 5/18  with acute HF. EF was 20-25% by echo on 08/03/16.  Cath showed severe 3vCAD - not candidate for CABG due to diffuse CAD and size.  Failed milrinone attempt in 1/19 due to worsening tachycardia. Has been manger medically.   Echo 10/03/19 EF 20-25% RV ok   Seen last week for an RN visit due to increasing SOB despite taking lasix 20mg  daily. ReDS ok (24%), ECG, CXR and labs ok.   Here for f/u with her sister. Still with episodes of SOB but some better. Can come at any time and then goes away. Sometimes dizzy. BP ok. No CP. No edema, orthopnea or PND. No f/c. Overall more fatigued in general. Hard to do ADLs,   ReDS 25%   Echo (08/15/18) EF ~25%   Cardiac studies:  CPX 11/18 FVC 2.01 (66%)    FEV1 1.53 (64%)     FEV1/FVC 76 (96%)     MVV 63 (70%)   Resting HR: 88 Peak HR: 126  (79% age predicted max HR)  BP rest: 124/76 BP peak: 158/70 Peak VO2: 16.8 (67% predicted peak VO2) VE/VCO2 slope: 48 OUES: 0.71 Peak RER: 1.07 Ventilatory Threshold: 12.7 (50% predicted and 76% measured peak VO2) VE/MVV: 51% O2pulse: 7  (100% predicted O2pulse)   RHC/LHC 03/16/2017   Mid LAD-1 lesion is 40% stenosed.  Mid LAD-2 lesion is 95% stenosed.  Ost 2nd Diag to 2nd Diag lesion is 50% stenosed.  Ost Cx to Prox Cx lesion is 60% stenosed.  1st Mrg lesion is 100% stenosed.  Prox RCA lesion is 90%  stenosed.  Mid RCA lesion is 30% stenosed.  Post Atrio lesion is 100% stenosed.  Dist RCA lesion is 100% stenosed.  Mid Cx lesion is 95% stenosed.  Dist Cx lesion is 60% stenosed.  Findings: Ao = 142/80 (105) LV = 158/25 RA = 1 RV = 29/2 PA = 30/19 (20) PCW = 20 Fick cardiac output/index = 2.4/1.7 PVR = 5.0 WU SVR = 3452  FA sat = 97% PA sat = 57%, 58% Assessment: 1. Severe 3v CAD unchanged from previous. No obvious culprit lesion for NSTEMI 2. Severe ischemic CM with EF 15% and low output physiology    PMHx:  . Allergic rhinitis 08/15/2016  . Chronic systolic heart failure ([place]) 08/15/2016  . Malignant neoplasm of overlapping sites of left female breast ([place]) 10/19/2015      Current Outpatient Medications  Medication Sig Dispense Refill  . acetaminophen (TYLENOL) 500 MG tablet Take 1,000 mg by mouth daily as needed for moderate pain or headache.     Marland Kitchen aspirin 81 MG chewable tablet Chew 1 tablet (81 mg total) by mouth daily. 90 tablet 4  . digoxin (LANOXIN) 0.125 MG tablet TAKE 1/2 TABLETS (0.0625 MG TOTAL) BY MOUTH DAILY. 45 tablet 3  . diphenhydrAMINE (BENADRYL) 25 mg capsule Take 25 mg by mouth every 6 (six) hours as needed for  allergies.    . furosemide (LASIX) 20 MG tablet Take 20 mg by mouth daily.    . ivabradine (CORLANOR) 7.5 MG TABS tablet Take 1 tablet (7.5 mg total) by mouth 2 (two) times daily with a meal. 180 tablet 3  . midodrine (PROAMATINE) 5 MG tablet Take 0.5 tablets (2.5 mg total) by mouth 3 (three) times daily with meals. 90 tablet 6  . nitroGLYCERIN (NITROSTAT) 0.4 MG SL tablet Place 1 tablet (0.4 mg total) under the tongue every 5 (five) minutes as needed for chest pain. 25 tablet 3  . ondansetron (ZOFRAN) 4 MG tablet Take 4 mg by mouth every 4 (four) hours as needed for nausea/vomiting.  2  . oxymetazoline (AFRIN) 0.05 % nasal spray Place 1 spray into both nostrils at bedtime as needed for congestion.    . potassium chloride (KLOR-CON) 10  MEQ tablet Take 10 mEq by mouth 2 (two) times daily.    . Probiotic Product (PROBIOTIC ADVANCED PO) Take 1 capsule by mouth daily.      No current facility-administered medications for this encounter.    Allergies  Allergen Reactions  . Chantix [Varenicline] Nausea And Vomiting  . Codeine Other (See Comments)    GI Upset, headaches        Social History   Socioeconomic History  . Marital status: Divorced    Spouse name: Not on file  . Number of children: Not on file  . Years of education: Not on file  . Highest education level: Not on file  Occupational History  . Not on file  Tobacco Use  . Smoking status: Light Tobacco Smoker    Years: 38.00    Types: Cigarettes  . Smokeless tobacco: Never Used  Vaping Use  . Vaping Use: Never used  Substance and Sexual Activity  . Alcohol use: No  . Drug use: No  . Sexual activity: Not Currently  Other Topics Concern  . Not on file  Social History Narrative  . Not on file   Social Determinants of Health   Financial Resource Strain: Not on file  Food Insecurity: Not on file  Transportation Needs: Not on file  Physical Activity: Not on file  Stress: Not on file  Social Connections: Not on file  Intimate Partner Violence: Not on file   Family History   . Heart disease Mother - + CAD with CABG at age 39 . Kidney cancer Mother  . Heart disease Father  - CABG and AVR in 32s . Breast cancer Paternal Aunt  . Breast cancer Cousin  . Colon cancer Neg Hx    - 3 sisters - no CAD   There were no vitals filed for this visit. Wt Readings from Last 3 Encounters:  05/29/20 48.4 kg (106 lb 9.6 oz)  04/27/20 49.1 kg (108 lb 4.8 oz)  03/20/20 48 kg (105 lb 12.8 oz)    PHYSICAL EXAM: General:  Thin Well appearing. No resp difficulty HEENT: normal Neck: supple. no JVD. Carotids 2+ bilat; no bruits. No lymphadenopathy or thryomegaly appreciated. Cor: PMI nondisplaced. Regular rate & rhythm. No rubs, gallops or murmurs. Lungs:  decreased throughout Abdomen: soft, nontender, nondistended. No hepatosplenomegaly. No bruits or masses. Good bowel sounds. Extremities: no cyanosis, clubbing, rash, edema Neuro: alert & orientedx3, cranial nerves grossly intact. moves all 4 extremities w/o difficulty. Affect pleasant   ASSESSMENT & PLAN:  1. Chronic systolic HF - 10/9209 .  ECHO EF 20-25%.  - 08/15/18  Echo EF 20-25% - Echo 10/03/19  EF 20-25% RV ok  - LHC with severe 3 vessel disease. Poor candidate for CABG or PCI due to diffuse CAD - She is worse over the last week or two.NYHA IIIB-IV. Etiology unclear. hstrop was negative. No evidence of congestion on exam or by ReDS. No other symtpoms suggestive of COVID-19 infection - We will repeat labs today. I have suggested RHC to exclude low output HF (cold and dry) but not sure it will change management at this point as she did not tolerate milrinone in past  - she wants to defer for now. Repeat echo.  - No bb with low output.  - Has been off losartan due to hypotension (failed rechallenge) and off lasix/spiro due to volume depletion.  - Continue digoxin  - Contiue ivabradine 7.5 bit - She does not want to try SGLT2i at this point. Can revisit in future.  - CPX reveals moderate to severe HF limitation with very high VEVCO2 slope. Spirometry not too bad - Failed milrinone attempt in 1/19 due to worsening tachycardia - Not candidate for advanced therapies and has failed inotropes. We offered her an evaluation at [facility] for 2nd opinion but she has declined.  2.Orthostatic hypotension -Stable/resolved  3. 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 S/p Adjuvant chemotherapy followed by left mastectomy and LN dissection 2/18 and XRT. Now complete. - No change   4. Tobacco use - Previously discussed need for smoking cessation   5. Pulmonary Nodules - saw Dr Vaughan Browner. CTwith scattered nodules. Thought to be non specific inflammation .   6. CAD - s/p NSTEM 1/19/ LHC 03/2017  Severe 3v CAD  unchanged from previous. No obvious culprit lesion for NSTEMI - no evidence of ACS - continue ASA/statin - Continue prn NTG.     [name], MD  11:24 PM

## 2020-06-03 ENCOUNTER — Other Ambulatory Visit: Payer: Self-pay

## 2020-06-03 ENCOUNTER — Ambulatory Visit (HOSPITAL_COMMUNITY)
Admission: RE | Admit: 2020-06-03 | Discharge: 2020-06-03 | Disposition: A | Discharge status: Home / Self Care | Payer: Medicare Other | Source: Ambulatory Visit | Attending: Internal Medicine | Admitting: Internal Medicine

## 2020-06-03 ENCOUNTER — Ambulatory Visit (HOSPITAL_BASED_OUTPATIENT_CLINIC_OR_DEPARTMENT_OTHER)
Admission: RE | Admit: 2020-06-03 | Discharge: 2020-06-03 | Disposition: A | Discharge status: Home / Self Care | Payer: Medicare Other | Source: Ambulatory Visit | Attending: Internal Medicine | Admitting: Internal Medicine

## 2020-06-03 DIAGNOSIS — I252 Old myocardial infarction: Secondary | ICD-10-CM | POA: Diagnosis not present

## 2020-06-03 DIAGNOSIS — I2510 Atherosclerotic heart disease of native coronary artery without angina pectoris: Secondary | ICD-10-CM

## 2020-06-03 DIAGNOSIS — I5022 Chronic systolic (congestive) heart failure: Secondary | ICD-10-CM | POA: Diagnosis not present

## 2020-06-03 LAB — COMPREHENSIVE METABOLIC PANEL
ALT: 17 U/L (ref 0–44)
AST: 19 U/L (ref 15–41)
Albumin: 4.1 g/dL (ref 3.5–5.0)
Alkaline Phosphatase: 50 U/L (ref 38–126)
Anion gap: 8 (ref 5–15)
BUN: 9 mg/dL (ref 8–23)
CO2: 28 mmol/L (ref 22–32)
Calcium: 9.4 mg/dL (ref 8.9–10.3)
Chloride: 100 mmol/L (ref 98–111)
Creatinine, Ser: 0.7 mg/dL (ref 0.44–1.00)
GFR, Estimated: >60 mL/min (ref >60)
Glucose, Bld: 112 mg/dL — ABNORMAL HIGH (ref 70–99)
Potassium: 3.7 mmol/L (ref 3.5–5.1)
Sodium: 136 mmol/L (ref 135–145)
Total Bilirubin: 0.5 mg/dL (ref 0.3–1.2)
Total Protein: 6.6 g/dL (ref 6.5–8.1)

## 2020-06-03 LAB — CBC
HCT: 48.4 % — ABNORMAL HIGH (ref 36.0–46.0)
Hemoglobin: 16.7 g/dL — ABNORMAL HIGH (ref 12.0–15.0)
MCH: 33.6 pg (ref 26.0–34.0)
MCHC: 34.5 g/dL (ref 30.0–36.0)
MCV: 97.4 fL (ref 80.0–100.0)
Platelets: 227 10*3/uL (ref 150–400)
RBC: 4.97 MIL/uL (ref 3.87–5.11)
RDW: 11.9 % (ref 11.5–15.5)
WBC: 8.4 10*3/uL (ref 4.0–10.5)
nRBC: 0 % (ref 0.0–0.2)

## 2020-06-03 LAB — T4, FREE: Free T4: 3.84 ng/dL — ABNORMAL HIGH (ref 0.61–1.12)

## 2020-06-03 LAB — TSH: TSH: 1.621 u[IU]/mL (ref 0.350–4.500)

## 2020-06-03 LAB — ECHOCARDIOGRAM COMPLETE
Area-P 1/2: 5.02 cm2
Calc EF: 33.3 %
S' Lateral: 4.6 cm
Single Plane A2C EF: 36.7 %
Single Plane A4C EF: 26.9 %

## 2020-06-03 MED ORDER — ONDANSETRON HCL 4 MG PO TABS: 4.0000 mg | ORAL_TABLET | ORAL | 2 refills | Status: AC | PRN

## 2020-06-03 MED ORDER — POTASSIUM CHLORIDE CRYS ER 10 MEQ PO TBCR: 10.0000 meq | EXTENDED_RELEASE_TABLET | Freq: Two times a day (BID) | ORAL | 3 refills | Status: DC

## 2020-06-03 MED ORDER — NITROGLYCERIN 0.4 MG SL SUBL: 0.4000 mg | SUBLINGUAL_TABLET | SUBLINGUAL | 3 refills | Status: AC | PRN

## 2020-06-03 NOTE — Progress Notes (Signed)
ReDS Vest / Clip - 06/03/20 0900      ReDS Vest / Clip   Station Marker A    Ruler Value 23    ReDS Value Range Low volume    ReDS Actual Value 25    Anatomical Comments sitting

## 2020-06-03 NOTE — Patient Instructions (Addendum)
Echo done today. We will contact you with the results.   Labs done today. We will contact you only if your labs are abnormal.  No medication changes were made. Please continue all current medications as prescribed.  Your physician recommends that you schedule a follow-up appointment in: 1 month   If you have any questions or concerns before your next appointment please send [place] a message through [place] or call our office at [phone].    TO LEAVE A MESSAGE FOR THE NURSE SELECT OPTION 2, PLEASE LEAVE A MESSAGE INCLUDING: . YOUR NAME . DATE OF BIRTH . CALL BACK NUMBER . REASON FOR CALL**this is important as we prioritize the call backs  YOU WILL RECEIVE A CALL BACK THE SAME DAY AS LONG AS YOU CALL BEFORE 4:00 PM   Do the following things EVERYDAY: 1) Weigh yourself in the morning before breakfast. Write it down and keep it in a log. 2) Take your medicines as prescribed 3) Eat low salt foods--Limit salt (sodium) to 2000 mg per day.  4) Stay as active as you can everyday 5) Limit all fluids for the day to less than 2 liters   At the [facility], you and your health needs are our priority. As part of our continuing mission to provide you with exceptional heart care, we have created designated Provider Care Teams. These Care Teams include your primary Cardiologist (physician) and Advanced Practice Providers (APPs- Physician Assistants and Nurse Practitioners) who all work together to provide you with the care you need, when you need it.   You may see any of the following providers on your designated Care Team at your next follow up: Marland Kitchen Dr [name] . Dr Loralie Champagne . Darrick Grinder, NP . Lyda Jester, PA . Audry Riles, PharmD   Please be sure to bring in all your medications bottles to every appointment.

## 2020-06-03 NOTE — Progress Notes (Signed)
  Echocardiogram 2D Echocardiogram has been performed.  Jennette Dubin 06/03/2020, 10:52 AM

## 2020-06-04 LAB — T3, FREE: T3, Free: 3.9 pg/mL (ref 2.0–4.4)

## 2020-07-05 NOTE — Progress Notes (Signed)
Advanced Heart Failure Clinic Note    Referring Physician: Gwenlyn Found  Primary Care: Dr. Garlon Hatchet  Primary Cardiologist: Gwenlyn Found   HPI: Robin Rose is a 65 y.o. female with h/o tobacco abuse, breast cancer (s/p chemo and left mastectomy) and systolic HF.    In 8/17 she was diagnosed with triple-negative breast cancer with lymph node involvement. She underwent neoadjuvant chemotherapy including adriamycin (6 cycles)  followed by left mastectomy in 2/18 and XRT (completed 4/18).   Her EF 11/05/15 was 55% and by MUGA 04/06/16 was 69%. She was admitted to [facility] 5/18  with acute HF. EF was 20-25% by echo on 08/03/16.  Cath showed severe 3vCAD - not candidate for CABG due to diffuse CAD and size.  Failed milrinone attempt in 1/19 due to worsening tachycardia. Has been manger medically.   Echo 10/03/19 EF 20-25% RV ok   Seen recently with worsening HF symptoms. ReDS ok (24%), ECG, CXR and labs ok.   Here for f/u with her sister. Says she is starting to feel better. But still having bad days particularly on days when she doesn't sleep well. No CP. Chronic DOE. Taking lasix as needed.    Echo 3/22 EF 25-30% moderate RV HK   Echo (08/15/18) EF ~25%   Cardiac studies:  CPX 11/18 FVC 2.01 (66%)    FEV1 1.53 (64%)     FEV1/FVC 76 (96%)     MVV 63 (70%)   Resting HR: 88 Peak HR: 126  (79% age predicted max HR)  BP rest: 124/76 BP peak: 158/70 Peak VO2: 16.8 (67% predicted peak VO2) VE/VCO2 slope: 48 OUES: 0.71 Peak RER: 1.07 Ventilatory Threshold: 12.7 (50% predicted and 76% measured peak VO2) VE/MVV: 51% O2pulse: 7  (100% predicted O2pulse)   RHC/LHC 03/16/2017   Mid LAD-1 lesion is 40% stenosed.  Mid LAD-2 lesion is 95% stenosed.  Ost 2nd Diag to 2nd Diag lesion is 50% stenosed.  Ost Cx to Prox Cx lesion is 60% stenosed.  1st Mrg lesion is 100% stenosed.  Prox RCA lesion is 90% stenosed.  Mid RCA lesion is 30% stenosed.  Post Atrio lesion  is 100% stenosed.  Dist RCA lesion is 100% stenosed.  Mid Cx lesion is 95% stenosed.  Dist Cx lesion is 60% stenosed.  Findings: Ao = 142/80 (105) LV = 158/25 RA = 1 RV = 29/2 PA = 30/19 (20) PCW = 20 Fick cardiac output/index = 2.4/1.7 PVR = 5.0 WU SVR = 3452  FA sat = 97% PA sat = 57%, 58% Assessment: 1. Severe 3v CAD unchanged from previous. No obvious culprit lesion for NSTEMI 2. Severe ischemic CM with EF 15% and low output physiology    PMHx:  . Allergic rhinitis 08/15/2016  . Chronic systolic heart failure ([place]) 08/15/2016  . Malignant neoplasm of overlapping sites of left female breast ([place]) 10/19/2015      Current Outpatient Medications  Medication Sig Dispense Refill  . acetaminophen (TYLENOL) 500 MG tablet Take 1,000 mg by mouth daily as needed for moderate pain or headache.     Marland Kitchen aspirin 81 MG chewable tablet Chew 1 tablet (81 mg total) by mouth daily. 90 tablet 4  . digoxin (LANOXIN) 0.125 MG tablet TAKE 1/2 TABLETS (0.0625 MG TOTAL) BY MOUTH DAILY. 45 tablet 3  . diphenhydrAMINE (BENADRYL) 25 mg capsule Take 25 mg by mouth every 6 (six) hours as needed for allergies.    . furosemide (LASIX) 20 MG tablet Take 20 mg by mouth  daily.    . ivabradine (CORLANOR) 7.5 MG TABS tablet Take 1 tablet (7.5 mg total) by mouth 2 (two) times daily with a meal. 180 tablet 3  . midodrine (PROAMATINE) 5 MG tablet Take 0.5 tablets (2.5 mg total) by mouth 3 (three) times daily with meals. 90 tablet 6  . nitroGLYCERIN (NITROSTAT) 0.4 MG SL tablet Place 1 tablet (0.4 mg total) under the tongue every 5 (five) minutes as needed for chest pain. 25 tablet 3  . ondansetron (ZOFRAN) 4 MG tablet Take 1 tablet (4 mg total) by mouth every 4 (four) hours as needed. 20 tablet 2  . oxymetazoline (AFRIN) 0.05 % nasal spray Place 1 spray into both nostrils at bedtime as needed for congestion.    . potassium chloride (KLOR-CON) 10 MEQ tablet Take 1 tablet (10 mEq total) by mouth 2 (two) times  daily. 60 tablet 3  . Probiotic Product (PROBIOTIC ADVANCED PO) Take 1 capsule by mouth daily.      No current facility-administered medications for this encounter.    Allergies  Allergen Reactions  . Chantix [Varenicline] Nausea And Vomiting  . Codeine Other (See Comments)    GI Upset, headaches        Social History   Socioeconomic History  . Marital status: Divorced    Spouse name: Not on file  . Number of children: Not on file  . Years of education: Not on file  . Highest education level: Not on file  Occupational History  . Not on file  Tobacco Use  . Smoking status: Light Tobacco Smoker    Years: 38.00    Types: Cigarettes  . Smokeless tobacco: Never Used  Vaping Use  . Vaping Use: Never used  Substance and Sexual Activity  . Alcohol use: No  . Drug use: No  . Sexual activity: Not Currently  Other Topics Concern  . Not on file  Social History Narrative  . Not on file   Social Determinants of Health   Financial Resource Strain: Not on file  Food Insecurity: Not on file  Transportation Needs: Not on file  Physical Activity: Not on file  Stress: Not on file  Social Connections: Not on file  Intimate Partner Violence: Not on file   Family History   . Heart disease Mother - + CAD with CABG at age 66 . Kidney cancer Mother  . Heart disease Father  - CABG and AVR in 40s . Breast cancer Paternal Aunt  . Breast cancer Cousin  . Colon cancer Neg Hx    - 3 sisters - no CAD   Vitals:   07/08/20 0941  BP: 124/78  Pulse: 68  SpO2: 99%  Weight: 48.9 kg (107 lb 12.8 oz)   Wt Readings from Last 3 Encounters:  07/08/20 48.9 kg (107 lb 12.8 oz)  05/29/20 48.4 kg (106 lb 9.6 oz)  04/27/20 49.1 kg (108 lb 4.8 oz)    PHYSICAL EXAM: General:  Thin Well appearing. No resp difficulty HEENT: normal Neck: supple. no JVD. Carotids 2+ bilat; no bruits. No lymphadenopathy or thryomegaly appreciated. Cor: PMI nondisplaced. Regular rate & rhythm. No rubs, gallops or  murmurs. Lungs:  decreased throughout Abdomen: soft, nontender, nondistended. No hepatosplenomegaly. No bruits or masses. Good bowel sounds. Extremities: no cyanosis, clubbing, rash, edema Neuro: alert & orientedx3, cranial nerves grossly intact. moves all 4 extremities w/o difficulty. Affect pleasant   ASSESSMENT & PLAN:  1. Chronic systolic HF - 11/1636 .  ECHO EF 20-25%.  -  08/15/18  Echo EF 20-25% - Echo 10/03/19 EF 20-25% RV ok  - Echo 3/22 EF 25-30% - LHC with severe 3 vessel disease. Poor candidate for CABG or PCI due to diffuse CAD - Slightly better since we last saw her. NYHA IIIB - Volume status ok on lasix 20 daily - No bb with low output.  - Off losartan d/t hypotension (failed rechallenge) and off lasix/spiro due to volume depletion.  - Continue digoxin  - Contiue ivabradine 7.5 bid - Does not want SGLT2i with frequent UTIs - CPX with moderate to severe HF limitation with very high VEVCO2 slope. Spirometry not too bad - Failed milrinone attempt in 1/19 due to worsening tachycardia - Not candidate for advanced therapies and has failed inotropes. We offered her an evaluation at [facility] for 2nd opinion but she has declined.  2.Orthostatic hypotension -Stable/resolved on midodirne  3. 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 S/p Adjuvant chemotherapy followed by left mastectomy and LN dissection 2/18 and XRT. Now complete. - No change   4. Tobacco use - Still smoking a bit. Understands need for cessaton   5. Pulmonary Nodules - saw Dr Vaughan Browner. CTwith scattered nodules. Thought to be non specific inflammation .   6. CAD - s/p NSTEM 1/19/ LHC 03/2017  Severe 3v CAD unchanged from previous. No obvious culprit lesion for NSTEMI - no evidence s/s ischemia - continue ASA/statin - Continue prn NTG.   7. Insomnia - careful trial of trazadone 50 qhs prn     [name], MD  8:04 PM

## 2020-07-08 ENCOUNTER — Ambulatory Visit (HOSPITAL_COMMUNITY)
Admission: RE | Admit: 2020-07-08 | Discharge: 2020-07-08 | Disposition: A | Discharge status: Home / Self Care | Payer: Medicare Other | Source: Ambulatory Visit | Attending: Internal Medicine | Admitting: Internal Medicine

## 2020-07-08 ENCOUNTER — Other Ambulatory Visit: Payer: Self-pay

## 2020-07-08 ENCOUNTER — Encounter (HOSPITAL_COMMUNITY): Payer: Self-pay | Admitting: Internal Medicine

## 2020-07-08 VITALS — BP 124/78 | HR 68 | Wt 107.8 lb

## 2020-07-08 DIAGNOSIS — Z885 Allergy status to narcotic agent status: Secondary | ICD-10-CM | POA: Diagnosis not present

## 2020-07-08 DIAGNOSIS — F17210 Nicotine dependence, cigarettes, uncomplicated: Secondary | ICD-10-CM | POA: Diagnosis not present

## 2020-07-08 DIAGNOSIS — G4700 Insomnia, unspecified: Secondary | ICD-10-CM | POA: Diagnosis not present

## 2020-07-08 DIAGNOSIS — I951 Orthostatic hypotension: Secondary | ICD-10-CM | POA: Diagnosis not present

## 2020-07-08 DIAGNOSIS — F5101 Primary insomnia: Secondary | ICD-10-CM

## 2020-07-08 DIAGNOSIS — Z720 Tobacco use: Secondary | ICD-10-CM

## 2020-07-08 DIAGNOSIS — Z8249 Family history of ischemic heart disease and other diseases of the circulatory system: Secondary | ICD-10-CM | POA: Insufficient documentation

## 2020-07-08 DIAGNOSIS — Z79899 Other long term (current) drug therapy: Secondary | ICD-10-CM | POA: Diagnosis not present

## 2020-07-08 DIAGNOSIS — Z7982 Long term (current) use of aspirin: Secondary | ICD-10-CM | POA: Diagnosis not present

## 2020-07-08 DIAGNOSIS — I5022 Chronic systolic (congestive) heart failure: Secondary | ICD-10-CM | POA: Diagnosis present

## 2020-07-08 DIAGNOSIS — Z853 Personal history of malignant neoplasm of breast: Secondary | ICD-10-CM | POA: Insufficient documentation

## 2020-07-08 DIAGNOSIS — Z9012 Acquired absence of left breast and nipple: Secondary | ICD-10-CM | POA: Diagnosis not present

## 2020-07-08 DIAGNOSIS — I255 Ischemic cardiomyopathy: Secondary | ICD-10-CM | POA: Diagnosis not present

## 2020-07-08 DIAGNOSIS — Z9221 Personal history of antineoplastic chemotherapy: Secondary | ICD-10-CM | POA: Insufficient documentation

## 2020-07-08 DIAGNOSIS — Z803 Family history of malignant neoplasm of breast: Secondary | ICD-10-CM | POA: Insufficient documentation

## 2020-07-08 DIAGNOSIS — I2510 Atherosclerotic heart disease of native coronary artery without angina pectoris: Secondary | ICD-10-CM | POA: Diagnosis not present

## 2020-07-08 DIAGNOSIS — I252 Old myocardial infarction: Secondary | ICD-10-CM | POA: Diagnosis not present

## 2020-07-08 MED ORDER — TRAZODONE HCL 50 MG PO TABS: 50.0000 mg | ORAL_TABLET | Freq: Every evening | ORAL | 6 refills | Status: DC | PRN

## 2020-07-08 NOTE — Patient Instructions (Signed)
Take Trazodone 50 mg at Bedtime as needed  Your physician recommends that you schedule a follow-up appointment in: 4-6 months  If you have any questions or concerns before your next appointment please send [place] a message through [place] or call our office at [phone].    TO LEAVE A MESSAGE FOR THE NURSE SELECT OPTION 2, PLEASE LEAVE A MESSAGE INCLUDING: . YOUR NAME . DATE OF BIRTH . CALL BACK NUMBER . REASON FOR CALL**this is important as we prioritize the call backs  [place] AS LONG AS YOU CALL BEFORE 4:00 PM  At the [facility], you and your health needs are our priority. As part of our continuing mission to provide you with exceptional heart care, we have created designated Provider Care Teams. These Care Teams include your primary Cardiologist (physician) and Advanced Practice Providers (APPs- Physician Assistants and Nurse Practitioners) who all work together to provide you with the care you need, when you need it.   You may see any of the following providers on your designated Care Team at your next follow up: Marland Kitchen Dr [name] . Dr Loralie Champagne . Dr Vickki Muff . Darrick Grinder, NP . Lyda Jester, [place] . Audry Riles, PharmD   Please be sure to bring in all your medications bottles to every appointment.

## 2020-07-27 NOTE — Progress Notes (Signed)
[facility]  [address] [place],  [place]  [address] [phone]  Clinic Day:  07/28/2020  Referring physician: [name], MD   CHIEF COMPLAINT:  CC: A 65 year old female with history of clinical stage IIIB triple negative left breast cancer here for 3 month evaluation  Current Treatment:  Observation   HISTORY OF PRESENT ILLNESS:  Robin Rose is a 65 y.o. female with history of clinical stage IIIB (T4b N1 M0) triple negative left breast cancer diagnosed in August 2017.  She felt a large area of firmness in the left lower outer quadrant for approximately 6 weeks and noticed nipple inversion.  She thought it was a muscle spasm.  The area was also pruritic.  She was seen by Dr. Henrene Pastor in July, who scheduled a diagnostic mammogram.  This revealed 3 lesions in the left breast, one at 1 o'clock measuring 1.4 cm, another at 2 o'clock measuring 1.9 cm and a 3rd at 12 o'clock measuring 4.4 cm behind the nipple.  Ultrasound confirmed these lesions and she was also found to have another lesion at 6:30 o'clock,  measuring 3 cm, as well as multiple pathologically enlarged nodes of the left axilla.  She had multiple biopsies in early August.  Pathology revealed the lesion at 2 o'clock to be a grade 3 invasive ductal carcinoma with ductal carcinoma in situ in multiple cores, and the lesion at 6:30 o'clock to be a grade 3, invasive ductal carcinoma with ductal carcinoma in situ in multiple cores.  Biopsy of a left axillary lymph node revealed high-grade metastatic carcinoma.  Estrogen and progesterone receptors were negative and her 2 Neu negative.  Ki 67 was 30%.  Due to her personal and family history of breast cancer, she underwent testing for hereditary breast and ovarian cancer with the [facility] Cancer Panel test.  This did not reveal any clinically significant mutation or variants of uncertain significance.  She received neoadjuvant TAC  chemotherapy, along with Neulasta to prevent complications of prolonged neutropenia.  She had significant toxicities secondary to chemotherapy including severe nausea, vomiting and diarrhea.  She was given fluids, antiemetics, and IV potassium. She was also given Zyprexa $RemoveBefor'5mg'RjiHcnIJUOIK$  at bedtime for the nausea.  She was then found to have C. difficile infection, which caused delay in her second cycle of chemotherapy.  She completed 6 cycles of chemotherapy.  She then underwent left mastectomy in February 2018.  Pathology revealed multicentric invasive ductal carcinoma, grade 3, with foci of ductal carcinoma in situ.  The largest focus of invasive carcinoma measured approximately 10 mm, and 2/5 lymph nodes had isolated tumor cells.  Therefore, her disease was downstaged to a pathologic stage IA (ypT1b ypN0).   She received adjuvant radiation to the left chest wall and axilla, completed in April 2018.  Prior to starting radiation, she was felt to have pleural effusion on her radiation simulation CT, so she underwent CT chest and abdomen.  This revealed trace bilateral pleural effusions, a postop seroma in the left axilla, and age advanced 3 vessel coronary artery disease, but no evidence of recurrent or metastatic disease.  She had chronic nausea since surgery.  She was referred to occupational therapy for lymphedema of the left upper extremity and was given a compression sleeve to wear.  She had physical therapy for reconditioning.  She was seen in May 2018 with increasing dyspnea.  CT angiogram was negative for pulmonary emboli or recurrent cancer,  but she did  have a mild pleural effusion.  Within 2 days, she was admitted to the hospital with respiratory distress and transferred to the intensive care unit.  She was found to be in congestive heart failure with bilateral pleural effusions and placed on diuretics, as well as digoxin and lisinopril.  Echocardiogram showed impaired left ventricular function with an ejection  fraction between 20-25% and left atrial dilatation with mild mitral regurgitation.  MUGA scan in January 2018 had shown a slight decrease of her EF from 64% to 57%.  We discussed the fact that this may have been a combination of the radiation and chemotherapy.  However, she was found to have severe coronary artery disease.   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  She refuses MRI scans.  Her port was removed in November 2018.  On March 14, 2017, she had an acute myocardial infarction, but recovered fairly well. She continues to follow with [place] every 6 months and Dr. Noberto Retort yearly in the summer.  Right screening mammogram in July did not reveal any evidence of malignancy.   INTERVAL HISTORY:  Robin Rose has been well since her last visit. She continues to have pain at her mastectomy site. CT imaging revealed radiation fibrosis. She was doing physical therapy until she experienced some cardiac changes and is now being followed by cardiology, so she put physical therapy on hold. She does continue some exercises at home. She denies fever, chills, nausea or vomiting. She denies issue with bowel or bladder. She denies shortness of breath, chest pain or cough. Her appetite and energy are good. CBC and CMP are unremarkable today other than a mildly decreased sodium level.  REVIEW OF SYSTEMS:  Review of Systems  Constitutional: Negative for appetite change, chills, diaphoresis, fatigue, fever and unexpected weight change.  HENT:   Negative for hearing loss, lump/mass, mouth sores, nosebleeds, sore throat, tinnitus, trouble swallowing and voice change.   Eyes: Negative for eye problems and icterus.  Respiratory: Negative for chest tightness, cough, hemoptysis, shortness of breath and wheezing.   Cardiovascular: Negative for chest pain, leg swelling and  palpitations.  Gastrointestinal: Negative for abdominal distention, abdominal pain, blood in stool, constipation, diarrhea, nausea, rectal pain and vomiting.  Endocrine: Negative for hot flashes.  Genitourinary: Negative for bladder incontinence, difficulty urinating, dyspareunia, dysuria, frequency, hematuria and nocturia.   Musculoskeletal: Positive for myalgias (of the left mastectomy from the axilla running down the left flank). Negative for arthralgias, back pain, flank pain, gait problem, neck pain and neck stiffness.  Skin: Negative for itching, rash and wound.  Neurological: Negative for dizziness, extremity weakness, gait problem, headaches, light-headedness, numbness, seizures and speech difficulty.  Hematological: Negative for adenopathy. Does not bruise/bleed easily.  Psychiatric/Behavioral: Negative for confusion, decreased concentration, depression, sleep disturbance and suicidal ideas. The patient is not nervous/anxious.      VITALS:  Blood pressure (!) 117/56, pulse 83, temperature 98.2 F (36.8 C), temperature source Oral, resp. rate 18, height 5' 2.5" (1.588 m), weight 108 lb 11.2 oz (49.3 kg), SpO2 97 %.  Wt Readings from Last 3 Encounters:  07/28/20 108 lb 11.2 oz (49.3 kg)  07/08/20 107 lb 12.8 oz (48.9 kg)  05/29/20 106 lb 9.6 oz (48.4 kg)    Body mass index is 19.56 kg/m.  Performance status (ECOG): 1 - Symptomatic but completely ambulatory  PHYSICAL EXAM:  Physical Exam Constitutional:      General: She is not in acute distress.    Appearance: Normal appearance. She is normal weight. She is not ill-appearing, toxic-appearing or diaphoretic.  HENT:     Head: Normocephalic and atraumatic.     Right Ear: Tympanic membrane normal.     Left Ear: Tympanic membrane normal.     Nose: Nose normal. No congestion or rhinorrhea.     Mouth/Throat:     Mouth: Mucous membranes are moist.     Pharynx: Oropharynx is clear. No oropharyngeal exudate or posterior oropharyngeal  erythema.  Eyes:     General: No scleral icterus.       Right eye: No discharge.        Left eye: No discharge.     Extraocular Movements: Extraocular movements intact.     Conjunctiva/sclera: Conjunctivae normal.     Pupils: Pupils are equal, round, and reactive to light.  Neck:     Vascular: No carotid bruit.  Cardiovascular:     Rate and Rhythm: Normal rate and regular rhythm.     Pulses: Normal pulses.     Heart sounds: Normal heart sounds. No murmur heard. No friction rub. No gallop.   Pulmonary:     Effort: Pulmonary effort is normal. No respiratory distress.     Breath sounds: Normal breath sounds. No stridor. No wheezing, rhonchi or rales.  Chest:     Chest wall: No mass, lacerations, deformity, swelling, tenderness, crepitus or edema. There is no dullness to percussion.  Breasts: Breasts are symmetrical.     Right: Normal. No swelling, bleeding, inverted nipple, mass, nipple discharge, skin change, tenderness, axillary adenopathy or supraclavicular adenopathy.     Left: Absent. No axillary adenopathy or supraclavicular adenopathy.      Comments: She has mild soft tissue swelling of the axillary tail of the left breast.  She has tendons in the left axilla that are very tight and there is a lot of scar tissue.  Left mastectomy is negative for evidence of recurrence.  She has tenderness of the left lateral ribcage on firm palpation.  Right breast is negative. Abdominal:     General: Abdomen is flat. Bowel sounds are normal. There is no distension.     Palpations: Abdomen is soft. There is no mass.     Tenderness: There is no abdominal tenderness. There is no right CVA tenderness, left CVA tenderness, guarding or rebound.     Hernia: No hernia is present.  Musculoskeletal:        General: No swelling, tenderness, deformity or signs of injury. Normal range of motion.     Cervical back: Normal range of motion and neck supple. No rigidity or tenderness.     Right lower leg: No  edema.     Left lower leg: No edema.  Lymphadenopathy:     Cervical: No cervical adenopathy.     Upper Body:     Right upper body: No supraclavicular, axillary or pectoral adenopathy.     Left upper body: No supraclavicular, axillary or pectoral adenopathy.  Skin:    General: Skin is warm and dry.     Capillary Refill: Capillary refill takes less than 2 seconds.     Coloration: Skin is not jaundiced or pale.     Findings: No bruising, erythema, lesion or rash.  Neurological:     General: No focal deficit present.     Mental Status: She is alert and oriented to person, place, and  time. Mental status is at baseline.     Cranial Nerves: No cranial nerve deficit.     Sensory: No sensory deficit.     Motor: No weakness.     Coordination: Coordination normal.     Gait: Gait normal.     Deep Tendon Reflexes: Reflexes normal.  Psychiatric:        Mood and Affect: Mood normal.        Behavior: Behavior normal.        Thought Content: Thought content normal.        Judgment: Judgment normal.    LABS:   CBC Latest Ref Rng & Units 07/28/2020 06/03/2020 02/19/2020  WBC - 8.0 8.4 9.3  Hemoglobin 12.0 - 16.0 15.9 16.7(H) 17.2(A)  Hematocrit 36 - 46 47(A) 48.4(H) 50(A)  Platelets 150 - 399 218 227 220   CMP Latest Ref Rng & Units 07/28/2020 06/03/2020 05/29/2020  Glucose 70 - 99 mg/dL - 112(H) 99  BUN 4 - 21 13 9 11   Creatinine 0.5 - 1.1 0.6 0.70 0.76  Sodium 137 - 147 133(A) 136 137  Potassium 3.4 - 5.3 3.9 3.7 4.1  Chloride 99 - 108 101 100 99  CO2 13 - 22 24(A) 28 30  Calcium 8.7 - 10.7 8.8 9.4 9.6  Total Protein 6.5 - 8.1 g/dL - 6.6 -  Total Bilirubin 0.3 - 1.2 mg/dL - 0.5 -  Alkaline Phos 25 - 125 67 50 -  AST 13 - 35 24 19 -  ALT 7 - 35 15 17 -    STUDIES:  Exam(s): [id number] CT/CT CHEST W/ CM CLINICAL DATA:  Left-sided rib pain and tenderness for 1 month, worse for a week, history of left breast cancer and mastectomy  EXAM: CT CHEST WITH  CONTRAST  TECHNIQUE: Multidetector CT imaging of the chest was performed during intravenous contrast administration.  CONTRAST:  60 mL Isovue 370 iodinated contrast IV  COMPARISON:  12/21/2016  FINDINGS: Cardiovascular: Aortic atherosclerosis. Extensive 3 vessel coronary artery calcifications and/or stents. Normal heart size. No pericardial effusion.  Mediastinum/Nodes: No enlarged mediastinal, hilar, or axillary lymph nodes. Thyroid gland, trachea, and esophagus demonstrate no significant findings.  Lungs/Pleura: Mild centrilobular emphysema. Mild biapical pleuroparenchymal scarring, unchanged. There is new subpleural consolidation of the anterior left upper lobe underlying mastectomy site (series 4, image 53). No pleural effusion or pneumothorax.  Upper Abdomen: No acute abnormality.  Musculoskeletal: No chest wall mass or suspicious bone lesions identified. Status post left mastectomy and left axillary lymph node dissection. Pectus deformity of the chest.  IMPRESSION: 1. There is new subpleural consolidation of the anterior left upper lobe underlying mastectomy site. 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 2. No specific findings of the left chest to explain rib pain. 3. Status post left mastectomy and left axillary lymph node dissection. 4. Emphysema. 5. Coronary artery disease.  Aortic Atherosclerosis (ICD10-I70.0) and Emphysema (ICD10-J43.9).   Electronically Signed   By: Eddie Candle M.D.   On: 04/28/2020 12:52  Electronically Signed By: Delanna Ahmadi MD  Electronically Signed Date/Time: 02/15/221255 Dictate Date/Time: 04/28/20 1237   Allergies:  Allergies  Allergen Reactions  . Chantix [Varenicline] Nausea And Vomiting  . Codeine Other (See Comments)    GI Upset, headaches      Current Medications: Current Outpatient Medications  Medication Sig Dispense Refill  . acetaminophen (TYLENOL) 500 MG tablet Take 1,000 mg by mouth  daily as needed for moderate pain or headache.     Marland Kitchen aspirin 81  MG chewable tablet Chew 1 tablet (81 mg total) by mouth daily. 90 tablet 4  . digoxin (LANOXIN) 0.125 MG tablet TAKE 1/2 TABLETS (0.0625 MG TOTAL) BY MOUTH DAILY. 45 tablet 3  . diphenhydrAMINE (BENADRYL) 25 mg capsule Take 25 mg by mouth every 6 (six) hours as needed for allergies.    . furosemide (LASIX) 20 MG tablet Take 20 mg by mouth daily as needed.    . ivabradine (CORLANOR) 7.5 MG TABS tablet Take 1 tablet (7.5 mg total) by mouth 2 (two) times daily with a meal. 180 tablet 3  . midodrine (PROAMATINE) 5 MG tablet Take 0.5 tablets (2.5 mg total) by mouth 3 (three) times daily with meals. 90 tablet 6  . nitroGLYCERIN (NITROSTAT) 0.4 MG SL tablet Place 1 tablet (0.4 mg total) under the tongue every 5 (five) minutes as needed for chest pain. 25 tablet 3  . ondansetron (ZOFRAN) 4 MG tablet Take 1 tablet (4 mg total) by mouth every 4 (four) hours as needed. 20 tablet 2  . oxymetazoline (AFRIN) 0.05 % nasal spray Place 1 spray into both nostrils at bedtime as needed for congestion.    . potassium chloride (KLOR-CON) 10 MEQ tablet Take 10 mEq by mouth daily as needed.    . Probiotic Product (PROBIOTIC ADVANCED PO) Take 1 capsule by mouth daily.     . traZODone (DESYREL) 50 MG tablet Take 1 tablet (50 mg total) by mouth at bedtime as needed for sleep. 30 tablet 6   No current facility-administered medications for this visit.     ASSESSMENT & PLAN:   Assessment:   1. Stage IIIB triple negative left breast cancer treated with aggressive neoadjuvant chemotherapy, followed by mastectomy and postmastectomy radiation.  She remains without evidence of recurrence.  She will continue observation.  2. Significant coronary artery disease and congestive heart failure, as well as history of myocardial infarction.  She is being followed by cardiology.  3. Mild elevation of the hemoglobin and hematocrit, intermittently since July 2019, which is  stable and we will continue to monitor.  4. Hypokalemia.  She was given potassium chloride 20 mEq twice daily.  Her potassium is normal at 3.9 today at this dose.  5.  Significant pain and spasms of the left mastectomy/axilla.  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   Plan: She was doing well on physical therapy; however, this was placed on hold due to cardiac concerns. She is doing some mild home PT while she follows with cardiology. We reviewed her CT imaging and discussed the radiation fibrosis. Clinical exam is negative today for disease recurrence. We will see her back in the clinic in 3 months for repeat evaluation.   She verbalizes understanding of and agreement to the plans discussed today. She knows to call the office should any new questions or concerns arise.    Melodye Ped, NP [facility] [id number] [place] [address] Dept: [phone] Dept Fax: [phone]

## 2020-07-28 ENCOUNTER — Other Ambulatory Visit: Payer: Self-pay | Admitting: Hematology and Oncology

## 2020-07-28 ENCOUNTER — Inpatient Hospital Stay: Payer: Medicare Other | Attending: Hematology and Oncology | Admitting: Hematology and Oncology

## 2020-07-28 ENCOUNTER — Other Ambulatory Visit: Payer: Self-pay

## 2020-07-28 ENCOUNTER — Inpatient Hospital Stay: Payer: Medicare Other

## 2020-07-28 ENCOUNTER — Encounter: Payer: Self-pay | Admitting: Hematology and Oncology

## 2020-07-28 VITALS — BP 117/56 | HR 83 | Temp 98.2°F | Resp 18 | Ht 62.5 in | Wt 108.7 lb

## 2020-07-28 DIAGNOSIS — Z171 Estrogen receptor negative status [ER-]: Secondary | ICD-10-CM

## 2020-07-28 DIAGNOSIS — C50812 Malignant neoplasm of overlapping sites of left female breast: Secondary | ICD-10-CM

## 2020-07-28 DIAGNOSIS — D649 Anemia, unspecified: Secondary | ICD-10-CM | POA: Diagnosis not present

## 2020-07-28 DIAGNOSIS — I2510 Atherosclerotic heart disease of native coronary artery without angina pectoris: Secondary | ICD-10-CM

## 2020-07-28 LAB — HEPATIC FUNCTION PANEL
ALT: 15 (ref 7–35)
AST: 24 (ref 13–35)
Alkaline Phosphatase: 67 (ref 25–125)
Bilirubin, Total: 0.6

## 2020-07-28 LAB — CBC AND DIFFERENTIAL
HCT: 47 — AB (ref 36–46)
Hemoglobin: 15.9 (ref 12.0–16.0)
Neutrophils Absolute: 5.52
Platelets: 218 (ref 150–399)
WBC: 8

## 2020-07-28 LAB — BASIC METABOLIC PANEL
BUN: 13 (ref 4–21)
CO2: 24 — AB (ref 13–22)
Chloride: 101 (ref 99–108)
Creatinine: 0.6 (ref 0.5–1.1)
Glucose: 105
Potassium: 3.9 (ref 3.4–5.3)
Sodium: 133 — AB (ref 137–147)

## 2020-07-28 LAB — CBC
MCV: 99 (ref 81–99)
RBC: 4.72 (ref 3.87–5.11)

## 2020-07-28 LAB — COMPREHENSIVE METABOLIC PANEL
Albumin: 4.4 (ref 3.5–5.0)
Calcium: 8.8 (ref 8.7–10.7)

## 2020-08-05 ENCOUNTER — Other Ambulatory Visit (HOSPITAL_COMMUNITY): Payer: Self-pay | Admitting: Internal Medicine

## 2020-09-09 ENCOUNTER — Other Ambulatory Visit (HOSPITAL_COMMUNITY): Payer: Self-pay | Admitting: *Deleted

## 2020-09-09 MED ORDER — IVABRADINE HCL 7.5 MG PO TABS: 7.5000 mg | ORAL_TABLET | Freq: Two times a day (BID) | ORAL | 3 refills | Status: DC

## 2020-10-12 DIAGNOSIS — Z1231 Encounter for screening mammogram for malignant neoplasm of breast: Secondary | ICD-10-CM | POA: Diagnosis not present

## 2020-10-19 DIAGNOSIS — C50812 Malignant neoplasm of overlapping sites of left female breast: Secondary | ICD-10-CM | POA: Diagnosis not present

## 2020-10-19 DIAGNOSIS — C773 Secondary and unspecified malignant neoplasm of axilla and upper limb lymph nodes: Secondary | ICD-10-CM | POA: Diagnosis not present

## 2020-10-19 DIAGNOSIS — Z171 Estrogen receptor negative status [ER-]: Secondary | ICD-10-CM | POA: Diagnosis not present

## 2020-10-22 NOTE — Progress Notes (Signed)
[name]  [address] [place],  [place]  [address] [phone]  Clinic Day:  10/29/2020  Referring physician: [name], MD  This document serves as a record of services personally performed by [name], MD. It was created on their behalf by Curry,Lauren E, a trained medical scribe. The creation of this record is based on the scribe's personal observations and the provider's statements to them.  CHIEF COMPLAINT:  CC: History of clinical stage IIIB triple negative left breast cancer   Current Treatment:  Observation   HISTORY OF PRESENT ILLNESS:  Robin Rose is a 65 y.o. female with history of clinical stage IIIB (T4b N1 M0) triple negative left breast cancer diagnosed in August 2017.  She felt a large area of firmness in the left lower outer quadrant for approximately 6 weeks and noticed nipple inversion.  She thought it was a muscle spasm.  The area was also pruritic.  She was seen by Dr. Henrene Pastor in July, who scheduled a diagnostic mammogram.  This revealed 3 lesions in the left breast, one at 1 o'clock measuring 1.4 cm, another at 2 o'clock measuring 1.9 cm and a 3rd at 12 o'clock measuring 4.4 cm behind the nipple.  Ultrasound confirmed these lesions and she was also found to have another lesion at 6:30 o'clock,  measuring 3 cm, as well as multiple pathologically enlarged nodes of the left axilla.  She had multiple biopsies in early August.  Pathology revealed the lesion at 2 o'clock to be a grade 3 invasive ductal carcinoma with ductal carcinoma in situ in multiple cores, and the lesion at 6:30 o'clock to be a grade 3, invasive ductal carcinoma with ductal carcinoma in situ in multiple cores.  Biopsy of a left axillary lymph node revealed high-grade metastatic carcinoma.  Estrogen and progesterone receptors were negative and her 2 Neu negative.  Ki 67 was 30%.  Due to her personal and family history of breast cancer, she  underwent testing for hereditary breast and ovarian cancer with the [facility] Cancer Panel test.  This did not reveal any clinically significant mutation or variants of uncertain significance.  She received neoadjuvant TAC chemotherapy, along with Neulasta to prevent complications of prolonged neutropenia.  She had significant toxicities secondary to chemotherapy including severe nausea, vomiting and diarrhea.  She was given fluids, antiemetics, and IV potassium. She was also given Zyprexa 34m at bedtime for the nausea.  She was then found to have C. difficile infection, which caused delay in her second cycle of chemotherapy.  She completed 6 cycles of chemotherapy.  She then underwent left mastectomy in February 2018.  Pathology revealed multicentric invasive ductal carcinoma, grade 3, with foci of ductal carcinoma in situ.  The largest focus of invasive carcinoma measured approximately 10 mm, and 2/5 lymph nodes had isolated tumor cells.  Therefore, her disease was downstaged to a pathologic stage IA (ypT1b ypN0).   She received adjuvant radiation to the left chest wall and axilla, completed in April 2018.  Prior to starting radiation, she was felt to have pleural effusion on her radiation simulation CT, so she underwent CT chest and abdomen.  This revealed trace bilateral pleural effusions, a postop seroma in the left axilla, and age advanced 3 vessel coronary artery disease, but no evidence of recurrent or metastatic disease.  She had chronic nausea since surgery.  She was referred to occupational therapy for lymphedema of the left upper extremity and was given a  compression sleeve to wear.  She had physical therapy for reconditioning.  She was seen in May 2018 with increasing dyspnea.  CT angiogram was negative for pulmonary emboli or recurrent cancer,  but she did have a mild pleural effusion.  Within 2 days, she was admitted to the hospital with respiratory distress and transferred to the intensive care  unit.  She was found to be in congestive heart failure with bilateral pleural effusions and placed on diuretics, as well as digoxin and lisinopril.  Echocardiogram showed impaired left ventricular function with an ejection fraction between 20-25% and left atrial dilatation with mild mitral regurgitation.  MUGA scan in January 2018 had shown a slight decrease of her EF from 64% to 57%.  We discussed the fact that this may have been a combination of the radiation and chemotherapy.  However, she was found to have severe coronary artery disease.   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  She refuses MRI scans.  Her port was removed in November 2018.  On March 14, 2017, she had an acute myocardial infarction, but recovered fairly well. She continues to follow with [place] every 6 months and Dr. Noberto Retort yearly in the summer.  Right screening mammogram in July did not reveal any evidence of malignancy.  She continues to have pain at her mastectomy site. CT imaging revealed radiation fibrosis.  INTERVAL HISTORY:  Robin Rose is here for routine follow up and states that she has been well.  Annual mammogram from August 1st was clear.  She does have occasional nausea and continues to have tolerable pain of the left chest wall, but otherwise denies complaints.  Blood counts and chemistries are unremarkable, including a potassium of 3.7.  She only takes oral calcium supplement, if she requires Lasix.  Her  appetite is good, and she has lost 1 pound since her last visit.  She denies fever, chills or other signs of infection.  She denies vomiting, bowel issues, or abdominal pain.  She denies sore throat, cough, dyspnea, or chest pain.  REVIEW OF SYSTEMS:  Review of Systems  Constitutional: Negative.  Negative for appetite change, chills, fatigue, fever and unexpected weight  change.  HENT:  Negative.    Eyes: Negative.   Respiratory: Negative.  Negative for chest tightness, cough, hemoptysis, shortness of breath and wheezing.   Cardiovascular:  Negative for chest pain, leg swelling and palpitations.  Gastrointestinal:  Positive for nausea (occasional). Negative for abdominal distention, abdominal pain, blood in stool, constipation, diarrhea and vomiting.  Endocrine: Negative.   Genitourinary: Negative.  Negative for difficulty urinating, dysuria, frequency and hematuria.   Musculoskeletal:  Negative for arthralgias, back pain, flank pain, gait problem and myalgias.       Occasional pain and spasms of the left mastectomy site  Skin: Negative.   Neurological: Negative.  Negative for dizziness, extremity weakness, gait problem, headaches, light-headedness, numbness, seizures and speech difficulty.  Hematological: Negative.   Psychiatric/Behavioral: Negative.  Negative for depression and sleep disturbance. The patient is not nervous/anxious.     VITALS:  Blood pressure (!) 128/59, pulse 88, temperature 97.8 F (36.6 C), temperature source Oral, resp. rate 18, height 5' 2.5" (1.588 m), weight 106 lb 12.8 oz (48.4 kg), SpO2 96 %.  Wt Readings from Last 3 Encounters:  10/29/20 106 lb 12.8 oz (48.4 kg)  07/28/20 108  lb 11.2 oz (49.3 kg)  07/08/20 107 lb 12.8 oz (48.9 kg)    Body mass index is 19.22 kg/m.  Performance status (ECOG): 1 - Symptomatic but completely ambulatory  PHYSICAL EXAM:  Physical Exam Constitutional:      General: She is not in acute distress.    Appearance: Normal appearance. She is normal weight.  HENT:     Head: Normocephalic and atraumatic.  Eyes:     General: No scleral icterus.    Extraocular Movements: Extraocular movements intact.     Conjunctiva/sclera: Conjunctivae normal.     Pupils: Pupils are equal, round, and reactive to light.  Cardiovascular:     Rate and Rhythm: Normal rate and regular rhythm.     Pulses: Normal  pulses.     Heart sounds: Normal heart sounds. No murmur heard.   No friction rub. No gallop.  Pulmonary:     Effort: Pulmonary effort is normal. No respiratory distress.     Breath sounds: Normal breath sounds.  Chest:  Breasts:    Right: Normal.     Left: Absent.     Comments: Left mastectomy is negative.  Right breast is without masses. Abdominal:     General: Bowel sounds are normal. There is no distension.     Palpations: Abdomen is soft. There is no hepatomegaly, splenomegaly or mass.     Tenderness: There is no abdominal tenderness.  Musculoskeletal:        General: Normal range of motion.     Cervical back: Normal range of motion and neck supple.     Right lower leg: No edema.     Left lower leg: No edema.  Lymphadenopathy:     Cervical: No cervical adenopathy.  Skin:    General: Skin is warm and dry.  Neurological:     General: No focal deficit present.     Mental Status: She is alert and oriented to person, place, and time. Mental status is at baseline.  Psychiatric:        Mood and Affect: Mood normal.        Behavior: Behavior normal.        Thought Content: Thought content normal.        Judgment: Judgment normal.   LABS:   CBC Latest Ref Rng & Units 10/29/2020 07/28/2020 06/03/2020  WBC - 7.0 8.0 8.4  Hemoglobin 12.0 - 16.0 16.4(A) 15.9 16.7(H)  Hematocrit 36 - 46 48(A) 47(A) 48.4(H)  Platelets 150 - 399 203 218 227   CMP Latest Ref Rng & Units 10/29/2020 07/28/2020 06/03/2020  Glucose 70 - 99 mg/dL - - 112(H)  BUN 4 - 21 11 13 9   Creatinine 0.5 - 1.1 0.8 0.6 0.70  Sodium 137 - 147 136(A) 133(A) 136  Potassium 3.4 - 5.3 3.7 3.9 3.7  Chloride 99 - 108 102 101 100  CO2 13 - 22 23(A) 24(A) 28  Calcium 8.7 - 10.7 9.3 8.8 9.4  Total Protein 6.5 - 8.1 g/dL - - 6.6  Total Bilirubin 0.3 - 1.2 mg/dL - - 0.5  Alkaline Phos 25 - 125 59 67 50  AST 13 - 35 24 24 19   ALT 7 - 35 17 15 17     STUDIES:  EXAM: 10/12/2020 DIGITAL SCREENING UNILATERAL RIGHT MAMMOGRAM  WITH CAD AND TOMOSYNTHESIS  TECHNIQUE: Right screening digital craniocaudal and mediolateral oblique mammograms were obtained. Right screening digital breast tomosynthesis was performed. The images were evaluated with computer-aided detection.  COMPARISON:  Previous exam(s).  ACR Breast Density Category b: There are scattered areas of fibroglandular density.  FINDINGS: There are no findings suspicious for malignancy.  IMPRESSION: No mammographic evidence of malignancy. A result letter of this screening mammogram will be mailed directly to the patient.   Allergies:  Allergies  Allergen Reactions   Chantix [Varenicline] Nausea And Vomiting   Codeine Other (See Comments)    GI Upset, headaches      Current Medications: Current Outpatient Medications  Medication Sig Dispense Refill   acetaminophen (TYLENOL) 500 MG tablet Take 1,000 mg by mouth daily as needed for moderate pain or headache.      aspirin 81 MG chewable tablet Chew 1 tablet (81 mg total) by mouth daily. 90 tablet 4   digoxin (LANOXIN) 0.125 MG tablet TAKE 1/2 TABLETS (0.0625 MG TOTAL) BY MOUTH DAILY. 45 tablet 3   diphenhydrAMINE (BENADRYL) 25 mg capsule Take 25 mg by mouth every 6 (six) hours as needed for allergies.     furosemide (LASIX) 20 MG tablet Take 20 mg by mouth daily as needed.     ivabradine (CORLANOR) 7.5 MG TABS tablet Take 1 tablet (7.5 mg total) by mouth 2 (two) times daily with a meal. 180 tablet 3   midodrine (PROAMATINE) 5 MG tablet Take 0.5 tablets (2.5 mg total) by mouth 3 (three) times daily with meals. 90 tablet 6   nitroGLYCERIN (NITROSTAT) 0.4 MG SL tablet Place 1 tablet (0.4 mg total) under the tongue every 5 (five) minutes as needed for chest pain. 25 tablet 3   ondansetron (ZOFRAN) 4 MG tablet Take 1 tablet (4 mg total) by mouth every 4 (four) hours as needed. 20 tablet 2   oxymetazoline (AFRIN) 0.05 % nasal spray Place 1 spray into both nostrils at bedtime as needed for congestion.      potassium chloride (KLOR-CON) 10 MEQ tablet Take 10 mEq by mouth daily as needed.     Probiotic Product (PROBIOTIC ADVANCED PO) Take 1 capsule by mouth daily.      traZODone (DESYREL) 50 MG tablet Take 1 tablet (50 mg total) by mouth at bedtime as needed for sleep. 30 tablet 6   No current facility-administered medications for this visit.     ASSESSMENT & PLAN:   Assessment:   1. Stage IIIB triple negative left breast cancer treated with aggressive neoadjuvant chemotherapy, followed by mastectomy and postmastectomy radiation.  She remains without evidence of recurrence at 5 years.  She will continue observation.  2. Significant coronary artery disease and congestive heart failure, as well as history of myocardial infarction.  She is being followed by cardiology.  3. Mild elevation of the hemoglobin and hematocrit, intermittently since July 2019, which is stable and we will continue to monitor.  4.  Pain and spasms of the left mastectomy/axilla.  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   Plan: Clinical exam and annual mammography is negative for disease recurrence. We will see her back in the clinic in 6 months with CBC and CMP for repeat evaluation. She verbalizes understanding of and agreement to the plans discussed today. She knows to call the office should any new questions or concerns arise.    Derwood Kaplan, MD [facility] AT [facility] [address] [place] [address] Dept: [phone] Dept Fax: [phone]   I, Rita Ohara, am acting as scribe for Derwood Kaplan, MD  I have reviewed this report as typed by the medical scribe, and it is  complete and accurate.

## 2020-10-28 ENCOUNTER — Other Ambulatory Visit: Payer: Self-pay | Admitting: Oncology

## 2020-10-28 DIAGNOSIS — Z171 Estrogen receptor negative status [ER-]: Secondary | ICD-10-CM

## 2020-10-28 DIAGNOSIS — C50812 Malignant neoplasm of overlapping sites of left female breast: Secondary | ICD-10-CM

## 2020-10-29 ENCOUNTER — Inpatient Hospital Stay: Payer: Medicare Other

## 2020-10-29 ENCOUNTER — Inpatient Hospital Stay: Payer: Medicare Other | Attending: Hematology and Oncology | Admitting: Oncology

## 2020-10-29 ENCOUNTER — Encounter: Payer: Self-pay | Admitting: Oncology

## 2020-10-29 ENCOUNTER — Other Ambulatory Visit: Payer: Self-pay | Admitting: Oncology

## 2020-10-29 ENCOUNTER — Other Ambulatory Visit: Payer: Self-pay

## 2020-10-29 VITALS — BP 128/59 | HR 88 | Temp 97.8°F | Resp 18 | Ht 62.5 in | Wt 106.8 lb

## 2020-10-29 DIAGNOSIS — C50812 Malignant neoplasm of overlapping sites of left female breast: Secondary | ICD-10-CM

## 2020-10-29 DIAGNOSIS — Z171 Estrogen receptor negative status [ER-]: Secondary | ICD-10-CM

## 2020-10-29 DIAGNOSIS — D649 Anemia, unspecified: Secondary | ICD-10-CM | POA: Diagnosis not present

## 2020-10-29 LAB — BASIC METABOLIC PANEL WITH GFR
BUN: 11 (ref 4–21)
CO2: 23 — AB (ref 13–22)
Chloride: 102 (ref 99–108)
Creatinine: 0.8 (ref 0.5–1.1)
Glucose: 109
Potassium: 3.7 (ref 3.4–5.3)
Sodium: 136 — AB (ref 137–147)

## 2020-10-29 LAB — HEPATIC FUNCTION PANEL
ALT: 17 (ref 7–35)
AST: 24 (ref 13–35)
Alkaline Phosphatase: 59 (ref 25–125)
Bilirubin, Total: 0.5

## 2020-10-29 LAB — CBC AND DIFFERENTIAL
HCT: 48 — AB (ref 36–46)
Hemoglobin: 16.4 — AB (ref 12.0–16.0)
Neutrophils Absolute: 4.62
Platelets: 203 (ref 150–399)
WBC: 7

## 2020-10-29 LAB — COMPREHENSIVE METABOLIC PANEL WITH GFR
Albumin: 4.5 (ref 3.5–5.0)
Calcium: 9.3 (ref 8.7–10.7)

## 2020-10-29 LAB — CBC: RBC: 4.85 (ref 3.87–5.11)

## 2020-11-08 ENCOUNTER — Other Ambulatory Visit (HOSPITAL_COMMUNITY): Payer: Self-pay | Admitting: Internal Medicine

## 2020-11-19 ENCOUNTER — Encounter (HOSPITAL_COMMUNITY): Payer: Medicare Other | Admitting: Internal Medicine

## 2021-01-07 DIAGNOSIS — Z23 Encounter for immunization: Secondary | ICD-10-CM | POA: Diagnosis not present

## 2021-01-14 ENCOUNTER — Other Ambulatory Visit: Payer: Self-pay

## 2021-01-14 ENCOUNTER — Ambulatory Visit (HOSPITAL_COMMUNITY)
Admission: RE | Admit: 2021-01-14 | Discharge: 2021-01-14 | Disposition: A | Discharge status: Home / Self Care | Payer: Medicare Other | Source: Ambulatory Visit | Attending: Internal Medicine | Admitting: Internal Medicine

## 2021-01-14 VITALS — BP 150/70 | HR 69 | Ht 62.5 in | Wt 107.4 lb

## 2021-01-14 DIAGNOSIS — Z7982 Long term (current) use of aspirin: Secondary | ICD-10-CM | POA: Diagnosis not present

## 2021-01-14 DIAGNOSIS — Z720 Tobacco use: Secondary | ICD-10-CM | POA: Diagnosis not present

## 2021-01-14 DIAGNOSIS — Z923 Personal history of irradiation: Secondary | ICD-10-CM | POA: Insufficient documentation

## 2021-01-14 DIAGNOSIS — Z9012 Acquired absence of left breast and nipple: Secondary | ICD-10-CM | POA: Diagnosis not present

## 2021-01-14 DIAGNOSIS — Z9221 Personal history of antineoplastic chemotherapy: Secondary | ICD-10-CM | POA: Diagnosis not present

## 2021-01-14 DIAGNOSIS — Z853 Personal history of malignant neoplasm of breast: Secondary | ICD-10-CM | POA: Diagnosis not present

## 2021-01-14 DIAGNOSIS — F17210 Nicotine dependence, cigarettes, uncomplicated: Secondary | ICD-10-CM | POA: Insufficient documentation

## 2021-01-14 DIAGNOSIS — R079 Chest pain, unspecified: Secondary | ICD-10-CM | POA: Diagnosis not present

## 2021-01-14 DIAGNOSIS — I5022 Chronic systolic (congestive) heart failure: Secondary | ICD-10-CM

## 2021-01-14 DIAGNOSIS — G4700 Insomnia, unspecified: Secondary | ICD-10-CM | POA: Diagnosis not present

## 2021-01-14 DIAGNOSIS — I2510 Atherosclerotic heart disease of native coronary artery without angina pectoris: Secondary | ICD-10-CM

## 2021-01-14 DIAGNOSIS — Z09 Encounter for follow-up examination after completed treatment for conditions other than malignant neoplasm: Secondary | ICD-10-CM | POA: Diagnosis not present

## 2021-01-14 DIAGNOSIS — I255 Ischemic cardiomyopathy: Secondary | ICD-10-CM | POA: Diagnosis not present

## 2021-01-14 DIAGNOSIS — Z79899 Other long term (current) drug therapy: Secondary | ICD-10-CM | POA: Insufficient documentation

## 2021-01-14 DIAGNOSIS — Z87440 Personal history of urinary (tract) infections: Secondary | ICD-10-CM | POA: Insufficient documentation

## 2021-01-14 DIAGNOSIS — I951 Orthostatic hypotension: Secondary | ICD-10-CM

## 2021-01-14 DIAGNOSIS — R918 Other nonspecific abnormal finding of lung field: Secondary | ICD-10-CM | POA: Diagnosis not present

## 2021-01-14 MED ORDER — ONDANSETRON HCL 4 MG PO TABS: 4.0000 mg | ORAL_TABLET | ORAL | 3 refills | Status: DC | PRN

## 2021-01-14 NOTE — Progress Notes (Signed)
Advanced Heart Failure Clinic Note    Referring Physician: Gwenlyn Found  Primary Care: Dr. Garlon Hatchet  Primary Cardiologist: Gwenlyn Found   HPI: Muranda Coye Luwana Butrick is a 65 y.o. female with h/o tobacco abuse, breast cancer (s/p chemo and left mastectomy) and systolic HF.    In 8/17 she was diagnosed with triple-negative breast cancer with lymph node involvement. She underwent neoadjuvant chemotherapy including adriamycin (6 cycles)  followed by left mastectomy in 2/18 and XRT (completed 4/18).   Her EF 11/05/15 was 55% and by MUGA 04/06/16 was 69%. She was admitted to [facility] 5/18  with acute HF. EF was 20-25% by echo on 08/03/16.  Cath showed severe 3vCAD - not candidate for CABG due to diffuse CAD and size.  Failed milrinone attempt in 1/19 due to worsening tachycardia. Has been manger medically.   Echo 10/03/19 EF 20-25% RV ok   Seen recently with worsening HF symptoms. ReDS ok (24%), ECG, CXR and labs ok.   Here for f/u with her sister. Says she has good days and bad days. On bad days completely wiped out and hard to do anything.On good days can do ADLs. BP has been going up (was always low) now SBP 130-140. Occasional CP - not getting more frequent. No orthopnea or PND. No edema    Echo 3/22 EF 25-30% moderate RV HK   Echo (08/15/18) EF ~25%   Cardiac studies:  CPX 11/18 FVC 2.01 (66%)      FEV1 1.53 (64%)        FEV1/FVC 76 (96%)        MVV 63 (70%)   Resting HR: 88 Peak HR: 126   (79% age predicted max HR)  BP rest: 124/76 BP peak: 158/70 Peak VO2: 16.8 (67% predicted peak VO2) VE/VCO2 slope:  48 OUES: 0.71 Peak RER: 1.07 Ventilatory Threshold: 12.7 (50% predicted and 76% measured peak VO2) VE/MVV:  51% O2pulse:  7   (100% predicted O2pulse)   RHC/LHC 03/16/2017  Mid LAD-1 lesion is 40% stenosed. Mid LAD-2 lesion is 95% stenosed. Ost 2nd Diag to 2nd Diag lesion is 50% stenosed. Ost Cx to Prox Cx lesion is 60% stenosed. 1st Mrg lesion is 100% stenosed. Prox RCA  lesion is 90% stenosed. Mid RCA lesion is 30% stenosed. Post Atrio lesion is 100% stenosed. Dist RCA lesion is 100% stenosed. Mid Cx lesion is 95% stenosed. Dist Cx lesion is 60% stenosed.  Findings:  Ao = 142/80 (105) LV = 158/25 RA = 1 RV = 29/2 PA = 30/19 (20) PCW = 20 Fick cardiac output/index = 2.4/1.7 PVR = 5.0 WU SVR = 3452  FA sat = 97% PA sat = 57%, 58%  Assessment: 1. Severe 3v CAD unchanged from previous. No obvious culprit lesion for NSTEMI 2. Severe ischemic CM with EF 15% and low output physiology    PMHx:   Allergic rhinitis 09/07/9483   Chronic systolic heart failure ([place]) 08/15/2016   Malignant neoplasm of overlapping sites of left female breast ([place]) 10/19/2015      Current Outpatient Medications  Medication Sig Dispense Refill   acetaminophen (TYLENOL) 500 MG tablet Take 1,000 mg by mouth daily as needed for moderate pain or headache.      aspirin 81 MG chewable tablet Chew 1 tablet (81 mg total) by mouth daily. 90 tablet 4   digoxin (LANOXIN) 0.125 MG tablet TAKE 1/2 TABLETS (0.0625 MG TOTAL) BY MOUTH DAILY. 45 tablet 3   diphenhydrAMINE (BENADRYL) 25 mg capsule Take 25 mg  by mouth every 6 (six) hours as needed for allergies.     furosemide (LASIX) 20 MG tablet Take 20 mg by mouth daily as needed.     ivabradine (CORLANOR) 7.5 MG TABS tablet Take 1 tablet (7.5 mg total) by mouth 2 (two) times daily with a meal. 180 tablet 3   midodrine (PROAMATINE) 5 MG tablet Take 0.5 tablets (2.5 mg total) by mouth 3 (three) times daily with meals. 90 tablet 6   nitroGLYCERIN (NITROSTAT) 0.4 MG SL tablet Place 1 tablet (0.4 mg total) under the tongue every 5 (five) minutes as needed for chest pain. 25 tablet 3   ondansetron (ZOFRAN) 4 MG tablet Take 1 tablet (4 mg total) by mouth every 4 (four) hours as needed. 20 tablet 2   oxymetazoline (AFRIN) 0.05 % nasal spray Place 1 spray into both nostrils at bedtime as needed for congestion.     potassium chloride (KLOR-CON)  10 MEQ tablet Take 10 mEq by mouth daily as needed.     Probiotic Product (PROBIOTIC ADVANCED PO) Take 1 capsule by mouth daily.      traZODone (DESYREL) 50 MG tablet Take 1 tablet (50 mg total) by mouth at bedtime as needed for sleep. 30 tablet 6   No current facility-administered medications for this encounter.    Allergies  Allergen Reactions   Chantix [Varenicline] Nausea And Vomiting   Codeine Other (See Comments)    GI Upset, headaches        Social History   Socioeconomic History   Marital status: Divorced    Spouse name: Not on file   Number of children: Not on file   Years of education: Not on file   Highest education level: Not on file  Occupational History   Not on file  Tobacco Use   Smoking status: Light Smoker    Years: 38.00    Types: Cigarettes   Smokeless tobacco: Never  Vaping Use   Vaping Use: Never used  Substance and Sexual Activity   Alcohol use: No   Drug use: No   Sexual activity: Not Currently  Other Topics Concern   Not on file  Social History Narrative   Not on file   Social Determinants of Health   Financial Resource Strain: Not on file  Food Insecurity: Not on file  Transportation Needs: Not on file  Physical Activity: Not on file  Stress: Not on file  Social Connections: Not on file  Intimate Partner Violence: Not on file   Family History    Heart disease Mother - + CAD with CABG at age 66  Kidney cancer Mother   Heart disease Father  - CABG and AVR in 60s  Breast cancer Paternal Aunt   Breast cancer Cousin   Colon cancer Neg Hx    - 3 sisters - no CAD   Vitals:   01/14/21 1444  BP: (!) 150/70  Pulse: 69  SpO2: 99%  Weight: 48.7 kg (107 lb 6.4 oz)  Height: 5' 2.5" (1.588 m)    Wt Readings from Last 3 Encounters:  10/29/20 48.4 kg (106 lb 12.8 oz)  07/28/20 49.3 kg (108 lb 11.2 oz)  07/08/20 48.9 kg (107 lb 12.8 oz)    PHYSICAL EXAM: General:  Thin Well appearing. No resp difficulty HEENT: normal Neck: supple.  no JVD. Carotids 2+ bilat; no bruits. No lymphadenopathy or thryomegaly appreciated. Cor: PMI nondisplaced. Regular rate & rhythm. No rubs, gallops or murmurs. Lungs: decreased throughout Abdomen: soft, nontender, nondistended.  No hepatosplenomegaly. No bruits or masses. Good bowel sounds. Extremities: no cyanosis, clubbing, rash, edema Neuro: alert & orientedx3, cranial nerves grossly intact. moves all 4 extremities w/o difficulty. Affect pleasant   ASSESSMENT & PLAN:  1. Chronic systolic HF - 04/231 .  ECHO EF 20-25%.  - 08/15/18  Echo EF 20-25% - Echo 10/03/19 EF 20-25% RV ok  - Echo 3/22 EF 25-30% - LHC with severe 3 vessel disease. Poor candidate for CABG or PCI due to diffuse CAD - Stable NYHA III - Volume status ok on lasix 20 daily - No bb with low output.  - Off losartan d/t hypotension (failed rechallenge) and off lasix/spiro due to volume depletion.  - Continue digoxin  - Contiue ivabradine 7.5 bid - BP now much improved will stop midodrine - Does not want SGLT2i with frequent UTIs - CPX 11/18 with moderate to severe HF limitation with very high VEVCO2 slope. Spirometry not too bad - Failed milrinone attempt in 1/19 due to worsening tachycardia - Not candidate for advanced therapies and has failed inotropes. We offered her an evaluation at [facility] for 2nd opinion but she has declined.  2.Orthostatic hypotension - Stable/resolved on midodrine - Will stop midodrine. Can use am dose as needed   3. 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 S/p Adjuvant chemotherapy followed by left mastectomy and LN dissection 2/18 and XRT. Now complete. - No change   4. Tobacco use - Still smoking. Uninterested in smoking  5. Pulmonary Nodules - saw Dr Vaughan Browner. CTwith scattered nodules. Thought to be non specific inflammation .   6. CAD - s/p NSTEM 1/19/ LHC 03/2017  Severe 3v CAD unchanged from previous. No obvious culprit lesion for NSTEMI -  occasioanl CP  - continue ASA/statin - Continue  prn NTG.   7. Insomnia - careful trial of trazadone 50 qhs prn     [name], MD  2:43 PM

## 2021-01-14 NOTE — Patient Instructions (Signed)
STOP Midodrine  Take Zofran 4 mg as needed for nausea  Your physician recommends that you schedule a follow-up appointment in: 6 months. Call office in beginning of year for May appointment  If you have any questions or concerns before your next appointment please send [place] a message through [place] or call our office at [phone].    TO LEAVE A MESSAGE FOR THE NURSE SELECT OPTION 2, PLEASE LEAVE A MESSAGE INCLUDING: YOUR NAME DATE OF BIRTH CALL BACK NUMBER REASON FOR CALL**this is important as we prioritize the call backs  YOU WILL RECEIVE A CALL BACK THE SAME DAY AS LONG AS YOU CALL BEFORE 4:00 PM  At the [facility], you and your health needs are our priority. As part of our continuing mission to provide you with exceptional heart care, we have created designated Provider Care Teams. These Care Teams include your primary Cardiologist (physician) and Advanced Practice Providers (APPs- Physician Assistants and Nurse Practitioners) who all work together to provide you with the care you need, when you need it.   You may see any of the following providers on your designated Care Team at your next follow up: Dr [name] Dr Haynes Kerns, NP Lyda Jester, [place] [facility] [place], [place] Audry Riles, PharmD   Please be sure to bring in all your medications bottles to every appointment.

## 2021-01-25 DIAGNOSIS — Z23 Encounter for immunization: Secondary | ICD-10-CM | POA: Diagnosis not present

## 2021-02-18 ENCOUNTER — Telehealth (HOSPITAL_COMMUNITY): Payer: Self-pay | Admitting: Pharmacist

## 2021-02-18 ENCOUNTER — Telehealth (HOSPITAL_COMMUNITY): Payer: Self-pay | Admitting: Pharmacy Technician

## 2021-02-18 MED ORDER — IVABRADINE HCL 7.5 MG PO TABS: 7.5000 mg | ORAL_TABLET | Freq: Two times a day (BID) | ORAL | 3 refills | Status: DC

## 2021-02-18 NOTE — Telephone Encounter (Signed)
Advanced Heart Failure Patient Advocate Encounter  Patient called in needing a refill of Corlanor sent to [facility]. Lauren, [facility]) sent that in.   Spoke to patient regarding re-enrollment of Corlanor assistance with [employer]. The company mailed out the patient an application today. She wants to wait for the application to come to her and she will send her portion in. She also will have new insurance next year and has received that card. I advised her to make a copy and send with the application. I will work on getting the prescriber's portion signed and faxed in.

## 2021-02-18 NOTE — Telephone Encounter (Signed)
Ivabradine prescription refill sent to [facility]. Patient receives through [place] patient assistance.   Audry Riles, PharmD, BCPS, BCCP, CPP Heart Failure Clinic Pharmacist [phone]

## 2021-02-22 ENCOUNTER — Other Ambulatory Visit (HOSPITAL_COMMUNITY): Payer: Self-pay | Admitting: *Deleted

## 2021-02-22 ENCOUNTER — Telehealth (HOSPITAL_COMMUNITY): Payer: Self-pay | Admitting: Pharmacist

## 2021-02-22 MED ORDER — IVABRADINE HCL 7.5 MG PO TABS: 7.5000 mg | ORAL_TABLET | Freq: Two times a day (BID) | ORAL | 3 refills | Status: DC

## 2021-02-22 NOTE — Telephone Encounter (Signed)
Provided patient with Corlanor samples.  Medication: Corlanor 5mg  Quantity: 28 tablets  Lot: [id number] Expiration date: 07/25   Audry Riles, PharmD, BCPS, CPP Heart Failure Clinic Pharmacist [phone]

## 2021-03-02 NOTE — Telephone Encounter (Signed)
Sent in provider's portion of [employer] application via fax, 21/22.  Will follow up.

## 2021-03-11 ENCOUNTER — Telehealth (HOSPITAL_COMMUNITY): Payer: Self-pay | Admitting: *Deleted

## 2021-03-11 NOTE — Telephone Encounter (Signed)
Pt called c/o increased sob/fatigue/wt gain. She states it has been going on for about a wk and a half, she has started taking her Lasix 20 mg Daily since then, she states wt will be up then down the next day then back up. She denies edema, abd dist. She states sob and fatigue occur with minimal exertion, such as just walking to the bathroom. Last night she was unable to lay flat and had to sleep propped up, which is new and unusual for her. Reports BP 139/46 HR 89. Will send to providers for further recommendations and call her back.

## 2021-03-11 NOTE — Telephone Encounter (Signed)
Spoke w/pt, she is aware, agreeable, and verbalized understanding, will increase Lasix and KCL, f/u appt sch for 03/16/21, if symptoms improved she will call back to cx

## 2021-03-15 NOTE — Progress Notes (Signed)
Advanced Heart Failure Clinic Note    Referring Physician: Gwenlyn Found  Primary Care: Dr. Garlon Hatchet  Primary Cardiologist: Gwenlyn Found   HPI: Robin Rose Vicky Schleich is a 66 y.o. female with h/o tobacco abuse, breast cancer (s/p chemo and left mastectomy) and systolic HF.    In 8/17 she was diagnosed with triple-negative breast cancer with lymph node involvement. She underwent neoadjuvant chemotherapy including adriamycin (6 cycles)  followed by left mastectomy in 2/18 and XRT (completed 4/18).   Her EF 11/05/15 was 55% and by MUGA 04/06/16 was 69%. She was admitted to [facility] 5/18  with acute HF. EF was 20-25% by echo on 08/03/16.  Cath showed severe 3vCAD - not candidate for CABG due to diffuse CAD and size.  Failed milrinone attempt in 1/19 due to worsening tachycardia. Has been manger medically.   Echo 10/03/19 EF 20-25% RV ok   Seen recently with worsening HF symptoms. ReDS ok (24%), ECG, CXR and labs ok.   Here with her sister for unscheduled visit for worsening SOB. Says for past 3 weeks has been SOB with any activity. Lasix increased from 20mg  to 40mg  daily for most days. Breathing improved. Over past few weeks several episodes of chest tightness. Still smoking.   Echo 3/22 EF 25-30% moderate RV HK   Echo (08/15/18) EF ~25%   Cardiac studies:  CPX 11/18 FVC 2.01 (66%)      FEV1 1.53 (64%)        FEV1/FVC 76 (96%)        MVV 63 (70%)   Resting HR: 88 Peak HR: 126   (79% age predicted max HR)  BP rest: 124/76 BP peak: 158/70 Peak VO2: 16.8 (67% predicted peak VO2) VE/VCO2 slope:  48 OUES: 0.71 Peak RER: 1.07 Ventilatory Threshold: 12.7 (50% predicted and 76% measured peak VO2) VE/MVV:  51% O2pulse:  7   (100% predicted O2pulse)   RHC/LHC 03/16/2017  Mid LAD-1 lesion is 40% stenosed. Mid LAD-2 lesion is 95% stenosed. Ost 2nd Diag to 2nd Diag lesion is 50% stenosed. Ost Cx to Prox Cx lesion is 60% stenosed. 1st Mrg lesion is 100% stenosed. Prox RCA lesion is 90%  stenosed. Mid RCA lesion is 30% stenosed. Post Atrio lesion is 100% stenosed. Dist RCA lesion is 100% stenosed. Mid Cx lesion is 95% stenosed. Dist Cx lesion is 60% stenosed.  Findings:  Ao = 142/80 (105) LV = 158/25 RA = 1 RV = 29/2 PA = 30/19 (20) PCW = 20 Fick cardiac output/index = 2.4/1.7 PVR = 5.0 WU SVR = 3452  FA sat = 97% PA sat = 57%, 58%  Assessment: 1. Severe 3v CAD unchanged from previous. No obvious culprit lesion for NSTEMI 2. Severe ischemic CM with EF 15% and low output physiology    PMHx:   Allergic rhinitis 16/96/7893   Chronic systolic heart failure ([place]) 08/15/2016   Malignant neoplasm of overlapping sites of left female breast ([place]) 10/19/2015      Current Outpatient Medications  Medication Sig Dispense Refill   acetaminophen (TYLENOL) 500 MG tablet Take 1,000 mg by mouth daily as needed for moderate pain or headache.      aspirin 81 MG chewable tablet Chew 1 tablet (81 mg total) by mouth daily. 90 tablet 4   digoxin (LANOXIN) 0.125 MG tablet TAKE 1/2 TABLETS (0.0625 MG TOTAL) BY MOUTH DAILY. 45 tablet 3   diphenhydrAMINE (BENADRYL) 25 mg capsule Take 25 mg by mouth every 6 (six) hours as needed for allergies.  furosemide (LASIX) 40 MG tablet Take 40 mg by mouth as needed.     ivabradine (CORLANOR) 7.5 MG TABS tablet Take 1 tablet (7.5 mg total) by mouth 2 (two) times daily with a meal. 180 tablet 3   nitroGLYCERIN (NITROSTAT) 0.4 MG SL tablet Place 1 tablet (0.4 mg total) under the tongue every 5 (five) minutes as needed for chest pain. 25 tablet 3   ondansetron (ZOFRAN) 4 MG tablet Take 1 tablet (4 mg total) by mouth every 4 (four) hours as needed. 20 tablet 2   oxymetazoline (AFRIN) 0.05 % nasal spray Place 1 spray into both nostrils at bedtime as needed for congestion.     potassium chloride (KLOR-CON) 10 MEQ tablet Take 10 mEq by mouth daily as needed.     traZODone (DESYREL) 50 MG tablet Take 1 tablet (50 mg total) by mouth at bedtime as  needed for sleep. 30 tablet 6   No current facility-administered medications for this encounter.    Allergies  Allergen Reactions   Chantix [Varenicline] Nausea And Vomiting   Codeine Other (See Comments)    GI Upset, headaches        Social History   Socioeconomic History   Marital status: Divorced    Spouse name: Not on file   Number of children: Not on file   Years of education: Not on file   Highest education level: Not on file  Occupational History   Not on file  Tobacco Use   Smoking status: Light Smoker    Years: 38.00    Types: Cigarettes   Smokeless tobacco: Never  Vaping Use   Vaping Use: Never used  Substance and Sexual Activity   Alcohol use: No   Drug use: No   Sexual activity: Not Currently  Other Topics Concern   Not on file  Social History Narrative   Not on file   Social Determinants of Health   Financial Resource Strain: Not on file  Food Insecurity: Not on file  Transportation Needs: Not on file  Physical Activity: Not on file  Stress: Not on file  Social Connections: Not on file  Intimate Partner Violence: Not on file   Family History    Heart disease Mother - + CAD with CABG at age 84  Kidney cancer Mother   Heart disease Father  - CABG and AVR in 84s  Breast cancer Paternal Aunt   Breast cancer Cousin   Colon cancer Neg Hx    - 3 sisters - no CAD   Vitals:   03/16/21 1440  BP: 118/68  Pulse: 79  SpO2: 96%  Weight: 48.9 kg (107 lb 12.8 oz)     Wt Readings from Last 3 Encounters:  03/16/21 48.9 kg (107 lb 12.8 oz)  01/14/21 48.7 kg (107 lb 6.4 oz)  10/29/20 48.4 kg (106 lb 12.8 oz)    PHYSICAL EXAM: General:  Thin Well appearing. No resp difficulty HEENT: normal Neck: supple. no JVD. Carotids 2+ bilat; no bruits. No lymphadenopathy or thryomegaly appreciated. Cor: PMI nondisplaced. Regular rate & rhythm. No rubs, gallops or murmurs. Lungs: decreased throughout Abdomen: soft, nontender, nondistended. No  hepatosplenomegaly. No bruits or masses. Good bowel sounds. Extremities: no cyanosis, clubbing, rash, edema Neuro: alert & orientedx3, cranial nerves grossly intact. moves all 4 extremities w/o difficulty. Affect pleasant  ECG NSR LBBB . No change Personally reviewed   ASSESSMENT & PLAN:  1. Chronic systolic HF - 10/1273 .  ECHO EF 20-25%.  - 08/15/18  Echo EF 20-25% - Echo 10/03/19 EF 20-25% RV ok  - Echo 3/22 EF 25-30% - LHC with severe 3 vessel disease. Poor candidate for CABG or PCI due to diffuse CAD - Worse recently NYHA IIIb-IV - Suspect related to volume overload which is improved today. Will have her take lasix 20 mg scheduled every M/F (+ PRN was previously just PRN)  - If symptoms persist (particularly chest pressure) can consider repeat cath - No bb with low output.  - Off losartan d/t hypotension (failed rechallenge) and off lasix/spiro due to volume depletion.  - Continue digoxin  - Contiue ivabradine 7.5 bid - BP  mproved  midodrine stopped 12/22 - Does not want SGLT2i with frequent UTIs - CPX 11/18 with moderate to severe HF limitation with very high VEVCO2 slope. Spirometry not too bad - Failed milrinone attempt in 1/19 due to worsening tachycardia - Not candidate for advanced therapies and has failed inotropes. We offered her an evaluation at [facility] for 2nd opinion but she has declined.  2.Orthostatic hypotension - Stable/resolved on midodrine - Midodrine stopped 12/22. Can resume as needed  3. 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 S/p Adjuvant chemotherapy followed by left mastectomy and LN dissection 2/18 and XRT. Now complete. - No change   4. Tobacco use - Still smoking. Uninterested in stopping  5. Pulmonary Nodules - saw Dr Vaughan Browner. CTwith scattered nodules. Thought to be non specific inflammation .   6. CAD - s/p NSTEM 1/19/ LHC 03/2017  Severe 3v CAD unchanged from previous. No obvious culprit lesion for NSTEMI - continue ASA/statin - has had more chest  pressure lately. Suspect related to volume overload but if not improving we discussed possible repeat cath though options for revascularizations are limited.  Total time spent 40 minutes. Over half that time spent discussing above.     [name], MD  3:10 PM

## 2021-03-16 ENCOUNTER — Other Ambulatory Visit: Payer: Self-pay

## 2021-03-16 ENCOUNTER — Encounter (HOSPITAL_COMMUNITY): Payer: Self-pay | Admitting: Internal Medicine

## 2021-03-16 ENCOUNTER — Ambulatory Visit (HOSPITAL_COMMUNITY)
Admission: RE | Admit: 2021-03-16 | Discharge: 2021-03-16 | Disposition: A | Discharge status: Home / Self Care | Payer: PRIVATE HEALTH INSURANCE | Source: Ambulatory Visit | Attending: Internal Medicine | Admitting: Internal Medicine

## 2021-03-16 VITALS — BP 118/68 | HR 79 | Wt 107.8 lb

## 2021-03-16 DIAGNOSIS — I951 Orthostatic hypotension: Secondary | ICD-10-CM

## 2021-03-16 DIAGNOSIS — Z87440 Personal history of urinary (tract) infections: Secondary | ICD-10-CM | POA: Insufficient documentation

## 2021-03-16 DIAGNOSIS — I428 Other cardiomyopathies: Secondary | ICD-10-CM

## 2021-03-16 DIAGNOSIS — Z9012 Acquired absence of left breast and nipple: Secondary | ICD-10-CM | POA: Insufficient documentation

## 2021-03-16 DIAGNOSIS — Z720 Tobacco use: Secondary | ICD-10-CM | POA: Diagnosis not present

## 2021-03-16 DIAGNOSIS — R0789 Other chest pain: Secondary | ICD-10-CM | POA: Insufficient documentation

## 2021-03-16 DIAGNOSIS — I255 Ischemic cardiomyopathy: Secondary | ICD-10-CM | POA: Diagnosis not present

## 2021-03-16 DIAGNOSIS — I5022 Chronic systolic (congestive) heart failure: Secondary | ICD-10-CM

## 2021-03-16 DIAGNOSIS — I2510 Atherosclerotic heart disease of native coronary artery without angina pectoris: Secondary | ICD-10-CM | POA: Diagnosis not present

## 2021-03-16 DIAGNOSIS — Z853 Personal history of malignant neoplasm of breast: Secondary | ICD-10-CM | POA: Insufficient documentation

## 2021-03-16 DIAGNOSIS — I252 Old myocardial infarction: Secondary | ICD-10-CM | POA: Insufficient documentation

## 2021-03-16 DIAGNOSIS — Z7982 Long term (current) use of aspirin: Secondary | ICD-10-CM | POA: Insufficient documentation

## 2021-03-16 DIAGNOSIS — F17210 Nicotine dependence, cigarettes, uncomplicated: Secondary | ICD-10-CM | POA: Insufficient documentation

## 2021-03-16 DIAGNOSIS — Z923 Personal history of irradiation: Secondary | ICD-10-CM | POA: Insufficient documentation

## 2021-03-16 DIAGNOSIS — R918 Other nonspecific abnormal finding of lung field: Secondary | ICD-10-CM | POA: Insufficient documentation

## 2021-03-16 DIAGNOSIS — Z79899 Other long term (current) drug therapy: Secondary | ICD-10-CM | POA: Insufficient documentation

## 2021-03-16 DIAGNOSIS — Z9221 Personal history of antineoplastic chemotherapy: Secondary | ICD-10-CM | POA: Diagnosis not present

## 2021-03-16 DIAGNOSIS — I444 Left anterior fascicular block: Secondary | ICD-10-CM | POA: Diagnosis not present

## 2021-03-16 DIAGNOSIS — R000 Tachycardia, unspecified: Secondary | ICD-10-CM | POA: Insufficient documentation

## 2021-03-16 LAB — BASIC METABOLIC PANEL
Anion gap: 10 (ref 5–15)
BUN: 10 mg/dL (ref 8–23)
CO2: 24 mmol/L (ref 22–32)
Calcium: 9.2 mg/dL (ref 8.9–10.3)
Chloride: 100 mmol/L (ref 98–111)
Creatinine, Ser: 0.91 mg/dL (ref 0.44–1.00)
GFR, Estimated: >60 mL/min (ref >60)
Glucose, Bld: 101 mg/dL — ABNORMAL HIGH (ref 70–99)
Potassium: 4 mmol/L (ref 3.5–5.1)
Sodium: 134 mmol/L — ABNORMAL LOW (ref 135–145)

## 2021-03-16 LAB — CBC
HCT: 45.3 % (ref 36.0–46.0)
Hemoglobin: 15.6 g/dL — ABNORMAL HIGH (ref 12.0–15.0)
MCH: 33.5 pg (ref 26.0–34.0)
MCHC: 34.4 g/dL (ref 30.0–36.0)
MCV: 97.2 fL (ref 80.0–100.0)
Platelets: 222 10*3/uL (ref 150–400)
RBC: 4.66 MIL/uL (ref 3.87–5.11)
RDW: 11.9 % (ref 11.5–15.5)
WBC: 11.1 10*3/uL — ABNORMAL HIGH (ref 4.0–10.5)
nRBC: 0 % (ref 0.0–0.2)

## 2021-03-16 LAB — BRAIN NATRIURETIC PEPTIDE: B Natriuretic Peptide: 648.7 pg/mL — ABNORMAL HIGH (ref 0.0–100.0)

## 2021-03-16 MED ORDER — FUROSEMIDE 20 MG PO TABS: 20.0000 mg | ORAL_TABLET | ORAL | Status: DC

## 2021-03-16 NOTE — Patient Instructions (Signed)
Take Furosemide 20 mg every Monday and Friday  If chest tightness/heaviness continues please let us know  Labs done today, your results will be available in MyChart, we will contact you for abnormal readings.  Your physician recommends that you schedule a follow-up appointment in: 3 months  If you have any questions or concerns before your next appointment please send [place] a message through [place] or call our office at [phone].    TO LEAVE A MESSAGE FOR THE NURSE SELECT OPTION 2, PLEASE LEAVE A MESSAGE INCLUDING: YOUR NAME DATE OF BIRTH CALL BACK NUMBER REASON FOR CALL**this is important as we prioritize the call backs  YOU WILL RECEIVE A CALL BACK THE SAME DAY AS LONG AS YOU CALL BEFORE 4:00 PM  At the [facility], you and your health needs are our priority. As part of our continuing mission to provide you with exceptional heart care, we have created designated Provider Care Teams. These Care Teams include your primary Cardiologist (physician) and Advanced Practice Providers (APPs- Physician Assistants and Nurse Practitioners) who all work together to provide you with the care you need, when you need it.   You may see any of the following providers on your designated Care Team at your next follow up: Dr [name] Dr Haynes Kerns, NP Lyda Jester, [place] [facility] [place], [place] Audry Riles, PharmD   Please be sure to bring in all your medications bottles to every appointment.

## 2021-03-16 NOTE — Progress Notes (Signed)
ReDS Vest / Clip - 03/16/21 1400       ReDS Vest / Clip   Station Marker A    Ruler Value 22    ReDS Value Range Low volume    ReDS Actual Value 21

## 2021-03-29 ENCOUNTER — Telehealth (HOSPITAL_COMMUNITY): Payer: Self-pay | Admitting: *Deleted

## 2021-03-29 ENCOUNTER — Inpatient Hospital Stay (HOSPITAL_COMMUNITY)
Admission: EM | Admit: 2021-03-29 | Discharge: 2021-04-01 | DRG: 280 | Disposition: A | Discharge status: Hospice | Payer: Medicare HMO | Attending: Internal Medicine | Admitting: Internal Medicine

## 2021-03-29 ENCOUNTER — Inpatient Hospital Stay (HOSPITAL_COMMUNITY): Payer: Medicare HMO

## 2021-03-29 ENCOUNTER — Emergency Department (HOSPITAL_COMMUNITY): Payer: Medicare HMO

## 2021-03-29 ENCOUNTER — Encounter (HOSPITAL_COMMUNITY)
Admission: EM | Disposition: A | Discharge status: Hospice | Payer: Self-pay | Source: Home / Self Care | Attending: Internal Medicine

## 2021-03-29 ENCOUNTER — Other Ambulatory Visit: Payer: Self-pay

## 2021-03-29 DIAGNOSIS — Z803 Family history of malignant neoplasm of breast: Secondary | ICD-10-CM

## 2021-03-29 DIAGNOSIS — Z853 Personal history of malignant neoplasm of breast: Secondary | ICD-10-CM | POA: Diagnosis not present

## 2021-03-29 DIAGNOSIS — E876 Hypokalemia: Secondary | ICD-10-CM | POA: Diagnosis present

## 2021-03-29 DIAGNOSIS — R64 Cachexia: Secondary | ICD-10-CM | POA: Diagnosis present

## 2021-03-29 DIAGNOSIS — I2584 Coronary atherosclerosis due to calcified coronary lesion: Secondary | ICD-10-CM | POA: Diagnosis present

## 2021-03-29 DIAGNOSIS — Z8249 Family history of ischemic heart disease and other diseases of the circulatory system: Secondary | ICD-10-CM | POA: Diagnosis not present

## 2021-03-29 DIAGNOSIS — Z20822 Contact with and (suspected) exposure to covid-19: Secondary | ICD-10-CM | POA: Diagnosis present

## 2021-03-29 DIAGNOSIS — I5023 Acute on chronic systolic (congestive) heart failure: Secondary | ICD-10-CM | POA: Diagnosis present

## 2021-03-29 DIAGNOSIS — I2510 Atherosclerotic heart disease of native coronary artery without angina pectoris: Secondary | ICD-10-CM | POA: Diagnosis not present

## 2021-03-29 DIAGNOSIS — F419 Anxiety disorder, unspecified: Secondary | ICD-10-CM | POA: Diagnosis present

## 2021-03-29 DIAGNOSIS — I214 Non-ST elevation (NSTEMI) myocardial infarction: Principal | ICD-10-CM | POA: Diagnosis present

## 2021-03-29 DIAGNOSIS — Z79899 Other long term (current) drug therapy: Secondary | ICD-10-CM

## 2021-03-29 DIAGNOSIS — Z171 Estrogen receptor negative status [ER-]: Secondary | ICD-10-CM | POA: Diagnosis not present

## 2021-03-29 DIAGNOSIS — Z885 Allergy status to narcotic agent status: Secondary | ICD-10-CM

## 2021-03-29 DIAGNOSIS — I252 Old myocardial infarction: Secondary | ICD-10-CM

## 2021-03-29 DIAGNOSIS — Z923 Personal history of irradiation: Secondary | ICD-10-CM | POA: Diagnosis not present

## 2021-03-29 DIAGNOSIS — Z9221 Personal history of antineoplastic chemotherapy: Secondary | ICD-10-CM

## 2021-03-29 DIAGNOSIS — Z8051 Family history of malignant neoplasm of kidney: Secondary | ICD-10-CM

## 2021-03-29 DIAGNOSIS — Z66 Do not resuscitate: Secondary | ICD-10-CM | POA: Diagnosis not present

## 2021-03-29 DIAGNOSIS — Z9012 Acquired absence of left breast and nipple: Secondary | ICD-10-CM

## 2021-03-29 DIAGNOSIS — R000 Tachycardia, unspecified: Secondary | ICD-10-CM | POA: Diagnosis present

## 2021-03-29 DIAGNOSIS — Z888 Allergy status to other drugs, medicaments and biological substances status: Secondary | ICD-10-CM

## 2021-03-29 DIAGNOSIS — Z7982 Long term (current) use of aspirin: Secondary | ICD-10-CM

## 2021-03-29 DIAGNOSIS — Z681 Body mass index (BMI) 19 or less, adult: Secondary | ICD-10-CM

## 2021-03-29 DIAGNOSIS — Z515 Encounter for palliative care: Secondary | ICD-10-CM

## 2021-03-29 DIAGNOSIS — F17210 Nicotine dependence, cigarettes, uncomplicated: Secondary | ICD-10-CM | POA: Diagnosis present

## 2021-03-29 DIAGNOSIS — I255 Ischemic cardiomyopathy: Secondary | ICD-10-CM | POA: Diagnosis present

## 2021-03-29 DIAGNOSIS — I739 Peripheral vascular disease, unspecified: Secondary | ICD-10-CM | POA: Diagnosis present

## 2021-03-29 DIAGNOSIS — I25110 Atherosclerotic heart disease of native coronary artery with unstable angina pectoris: Secondary | ICD-10-CM | POA: Diagnosis present

## 2021-03-29 DIAGNOSIS — Z7189 Other specified counseling: Secondary | ICD-10-CM | POA: Diagnosis not present

## 2021-03-29 DIAGNOSIS — R54 Age-related physical debility: Secondary | ICD-10-CM | POA: Diagnosis present

## 2021-03-29 HISTORY — PX: RIGHT/LEFT HEART CATH AND CORONARY ANGIOGRAPHY: CATH118266

## 2021-03-29 HISTORY — PX: CENTRAL LINE INSERTION: CATH118232

## 2021-03-29 LAB — MRSA NEXT GEN BY PCR, NASAL: MRSA by PCR Next Gen: NOT DETECTED

## 2021-03-29 LAB — POCT I-STAT 7, (LYTES, BLD GAS, ICA,H+H)
Acid-Base Excess: 3 mmol/L — ABNORMAL HIGH (ref 0.0–2.0)
Acid-base deficit: 2 mmol/L (ref 0.0–2.0)
Bicarbonate: 20.4 mmol/L (ref 20.0–28.0)
Bicarbonate: 26.6 mmol/L (ref 20.0–28.0)
Calcium, Ion: 0.8 mmol/L — CL (ref 1.15–1.40)
Calcium, Ion: 1.12 mmol/L — ABNORMAL LOW (ref 1.15–1.40)
HCT: 36 % (ref 36.0–46.0)
HCT: 42 % (ref 36.0–46.0)
Hemoglobin: 12.2 g/dL (ref 12.0–15.0)
Hemoglobin: 14.3 g/dL (ref 12.0–15.0)
O2 Saturation: 54 %
O2 Saturation: 97 %
Potassium: 3.1 mmol/L — ABNORMAL LOW (ref 3.5–5.1)
Potassium: 3.8 mmol/L (ref 3.5–5.1)
Sodium: 138 mmol/L (ref 135–145)
Sodium: 143 mmol/L (ref 135–145)
TCO2: 21 mmol/L — ABNORMAL LOW (ref 22–32)
TCO2: 28 mmol/L (ref 22–32)
pCO2 arterial: 27.3 mmHg — ABNORMAL LOW (ref 32.0–48.0)
pCO2 arterial: 37.4 mmHg (ref 32.0–48.0)
pH, Arterial: 7.459 — ABNORMAL HIGH (ref 7.350–7.450)
pH, Arterial: 7.48 — ABNORMAL HIGH (ref 7.350–7.450)
pO2, Arterial: 27 mmHg — CL (ref 83.0–108.0)
pO2, Arterial: 80 mmHg — ABNORMAL LOW (ref 83.0–108.0)

## 2021-03-29 LAB — MAGNESIUM: Magnesium: 2 mg/dL (ref 1.7–2.4)

## 2021-03-29 LAB — DIGOXIN LEVEL: Digoxin Level: 0.3 ng/mL — ABNORMAL LOW (ref 0.8–2.0)

## 2021-03-29 LAB — BASIC METABOLIC PANEL
Anion gap: 14 (ref 5–15)
BUN: 9 mg/dL (ref 8–23)
CO2: 24 mmol/L (ref 22–32)
Calcium: 9 mg/dL (ref 8.9–10.3)
Chloride: 96 mmol/L — ABNORMAL LOW (ref 98–111)
Creatinine, Ser: 0.71 mg/dL (ref 0.44–1.00)
GFR, Estimated: >60 mL/min (ref >60)
Glucose, Bld: 145 mg/dL — ABNORMAL HIGH (ref 70–99)
Potassium: 2.9 mmol/L — ABNORMAL LOW (ref 3.5–5.1)
Sodium: 134 mmol/L — ABNORMAL LOW (ref 135–145)

## 2021-03-29 LAB — CBC WITH DIFFERENTIAL/PLATELET
Abs Immature Granulocytes: 0.07 10*3/uL (ref 0.00–0.07)
Basophils Absolute: 0.1 10*3/uL (ref 0.0–0.1)
Basophils Relative: 1 %
Eosinophils Absolute: 0.1 10*3/uL (ref 0.0–0.5)
Eosinophils Relative: 1 %
HCT: 41.4 % (ref 36.0–46.0)
Hemoglobin: 14.2 g/dL (ref 12.0–15.0)
Immature Granulocytes: 1 %
Lymphocytes Relative: 16 %
Lymphs Abs: 2.1 10*3/uL (ref 0.7–4.0)
MCH: 33.6 pg (ref 26.0–34.0)
MCHC: 34.3 g/dL (ref 30.0–36.0)
MCV: 98.1 fL (ref 80.0–100.0)
Monocytes Absolute: 0.6 10*3/uL (ref 0.1–1.0)
Monocytes Relative: 4 %
Neutro Abs: 10.4 10*3/uL — ABNORMAL HIGH (ref 1.7–7.7)
Neutrophils Relative %: 77 %
Platelets: 295 10*3/uL (ref 150–400)
RBC: 4.22 MIL/uL (ref 3.87–5.11)
RDW: 12.1 % (ref 11.5–15.5)
WBC: 13.3 10*3/uL — ABNORMAL HIGH (ref 4.0–10.5)
nRBC: 0 % (ref 0.0–0.2)

## 2021-03-29 LAB — RESP PANEL BY RT-PCR (FLU A&B, COVID) ARPGX2
Influenza A by PCR: NEGATIVE
Influenza B by PCR: NEGATIVE
SARS Coronavirus 2 by RT PCR: NEGATIVE

## 2021-03-29 LAB — TROPONIN I (HIGH SENSITIVITY)
Troponin I (High Sensitivity): 354 ng/L (ref <18)
Troponin I (High Sensitivity): 777 ng/L (ref <18)

## 2021-03-29 LAB — POCT ACTIVATED CLOTTING TIME
Activated Clotting Time: 173 seconds
Activated Clotting Time: 125 seconds

## 2021-03-29 LAB — BRAIN NATRIURETIC PEPTIDE: B Natriuretic Peptide: 1462.5 pg/mL — ABNORMAL HIGH (ref 0.0–100.0)

## 2021-03-29 SURGERY — RIGHT/LEFT HEART CATH AND CORONARY ANGIOGRAPHY
Anesthesia: LOCAL

## 2021-03-29 MED ORDER — NITROGLYCERIN IN D5W 200-5 MCG/ML-% IV SOLN: 2.0000 ug/min | INTRAVENOUS | Status: DC

## 2021-03-29 MED ORDER — VERAPAMIL HCL 2.5 MG/ML IV SOLN
INTRAVENOUS | Status: DC | PRN
  Administered 2021-03-29: 10 mL via INTRA_ARTERIAL

## 2021-03-29 MED ORDER — HEPARIN SODIUM (PORCINE) 1000 UNIT/ML IJ SOLN
INTRAMUSCULAR | Status: AC
  Filled 2021-03-29: qty 10

## 2021-03-29 MED ORDER — FENTANYL CITRATE (PF) 100 MCG/2ML IJ SOLN
INTRAMUSCULAR | Status: DC | PRN
  Administered 2021-03-29: 25 ug via INTRAVENOUS

## 2021-03-29 MED ORDER — ACETAMINOPHEN 325 MG PO TABS: 650.0000 mg | ORAL_TABLET | ORAL | Status: DC | PRN

## 2021-03-29 MED ORDER — ORAL CARE MOUTH RINSE
15.0000 mL | Freq: Two times a day (BID) | OROMUCOSAL | Status: DC
  Administered 2021-03-29 – 2021-03-31 (×3): 15 mL via OROMUCOSAL

## 2021-03-29 MED ORDER — HEPARIN (PORCINE) IN NACL 1000-0.9 UT/500ML-% IV SOLN
INTRAVENOUS | Status: AC
  Filled 2021-03-29: qty 1000

## 2021-03-29 MED ORDER — DIGOXIN 125 MCG PO TABS
0.0625 mg | ORAL_TABLET | Freq: Every day | ORAL | Status: DC
  Administered 2021-03-30 – 2021-04-01 (×4): 0.0625 mg via ORAL
  Filled 2021-03-29 (×5): qty 1

## 2021-03-29 MED ORDER — ACETAMINOPHEN 325 MG PO TABS
650.0000 mg | ORAL_TABLET | Freq: Once | ORAL | Status: AC
  Administered 2021-03-29: 650 mg via ORAL
  Filled 2021-03-29: qty 2

## 2021-03-29 MED ORDER — HEPARIN (PORCINE) 25000 UT/250ML-% IV SOLN
550.0000 [IU]/h | INTRAVENOUS | Status: DC
  Administered 2021-03-29: 550 [IU]/h via INTRAVENOUS
  Filled 2021-03-29 (×2): qty 250

## 2021-03-29 MED ORDER — SODIUM CHLORIDE 0.9% FLUSH
3.0000 mL | Freq: Two times a day (BID) | INTRAVENOUS | Status: DC
  Administered 2021-03-30 – 2021-03-31 (×2): 3 mL via INTRAVENOUS

## 2021-03-29 MED ORDER — CHLORHEXIDINE GLUCONATE CLOTH 2 % EX PADS
6.0000 | MEDICATED_PAD | Freq: Every day | CUTANEOUS | Status: DC
  Administered 2021-03-29 – 2021-03-31 (×2): 6 via TOPICAL

## 2021-03-29 MED ORDER — POTASSIUM CHLORIDE CRYS ER 20 MEQ PO TBCR
40.0000 meq | EXTENDED_RELEASE_TABLET | ORAL | Status: DC
  Administered 2021-03-29: 40 meq via ORAL
  Filled 2021-03-29: qty 2

## 2021-03-29 MED ORDER — SODIUM CHLORIDE 0.9% FLUSH
10.0000 mL | INTRAVENOUS | Status: DC | PRN
  Administered 2021-03-31: 10 mL

## 2021-03-29 MED ORDER — NITROGLYCERIN 0.4 MG SL SUBL
0.4000 mg | SUBLINGUAL_TABLET | SUBLINGUAL | Status: DC | PRN
  Administered 2021-03-30 – 2021-03-31 (×4): 0.4 mg via SUBLINGUAL
  Filled 2021-03-29 (×3): qty 1

## 2021-03-29 MED ORDER — FUROSEMIDE 10 MG/ML IJ SOLN
20.0000 mg | Freq: Once | INTRAMUSCULAR | Status: AC
  Administered 2021-03-29: 20 mg via INTRAVENOUS
  Filled 2021-03-29: qty 2

## 2021-03-29 MED ORDER — LIP MEDEX EX OINT
TOPICAL_OINTMENT | CUTANEOUS | Status: DC | PRN
  Filled 2021-03-29: qty 7

## 2021-03-29 MED ORDER — IVABRADINE HCL 7.5 MG PO TABS
7.5000 mg | ORAL_TABLET | Freq: Two times a day (BID) | ORAL | Status: DC
  Administered 2021-03-30 (×2): 7.5 mg via ORAL
  Filled 2021-03-29 (×5): qty 1

## 2021-03-29 MED ORDER — HEPARIN SODIUM (PORCINE) 1000 UNIT/ML IJ SOLN
INTRAMUSCULAR | Status: DC | PRN
  Administered 2021-03-29: 2500 [IU] via INTRAVENOUS

## 2021-03-29 MED ORDER — SODIUM CHLORIDE 0.9% FLUSH: 3.0000 mL | INTRAVENOUS | Status: DC | PRN

## 2021-03-29 MED ORDER — FENTANYL CITRATE PF 50 MCG/ML IJ SOSY
25.0000 ug | PREFILLED_SYRINGE | INTRAMUSCULAR | Status: DC | PRN
  Administered 2021-03-29 – 2021-03-30 (×3): 25 ug via INTRAVENOUS
  Filled 2021-03-29 (×3): qty 1

## 2021-03-29 MED ORDER — SODIUM CHLORIDE 0.9% FLUSH
10.0000 mL | Freq: Two times a day (BID) | INTRAVENOUS | Status: DC
  Administered 2021-03-29: 22:00:00 10 mL
  Administered 2021-03-30: 20 mL
  Administered 2021-03-30: 30 mL
  Administered 2021-03-31: 10 mL
  Administered 2021-03-31: 30 mL
  Administered 2021-04-01: 10 mL

## 2021-03-29 MED ORDER — SODIUM CHLORIDE 0.9 % IV SOLN: INTRAVENOUS | Status: DC

## 2021-03-29 MED ORDER — MIDAZOLAM HCL 2 MG/2ML IJ SOLN
0.5000 mg | INTRAMUSCULAR | Status: DC | PRN
  Administered 2021-03-30 (×2): 0.5 mg via INTRAVENOUS
  Filled 2021-03-29 (×2): qty 2

## 2021-03-29 MED ORDER — LIP MEDEX EX OINT
TOPICAL_OINTMENT | CUTANEOUS | Status: DC | PRN
  Administered 2021-03-29: 1 via TOPICAL
  Filled 2021-03-29 (×2): qty 7

## 2021-03-29 MED ORDER — ASPIRIN 81 MG PO CHEW
81.0000 mg | CHEWABLE_TABLET | Freq: Every day | ORAL | Status: DC
  Administered 2021-03-30 – 2021-03-31 (×2): 81 mg via ORAL
  Filled 2021-03-29 (×2): qty 1

## 2021-03-29 MED ORDER — MIDAZOLAM HCL 2 MG/2ML IJ SOLN
INTRAMUSCULAR | Status: AC
  Filled 2021-03-29: qty 2

## 2021-03-29 MED ORDER — HEPARIN (PORCINE) 25000 UT/250ML-% IV SOLN
700.0000 [IU]/h | INTRAVENOUS | Status: DC
  Administered 2021-03-30: 550 [IU]/h via INTRAVENOUS

## 2021-03-29 MED ORDER — ACETAMINOPHEN 500 MG PO TABS: 1000.0000 mg | ORAL_TABLET | Freq: Every day | ORAL | Status: DC | PRN

## 2021-03-29 MED ORDER — MIDAZOLAM HCL 2 MG/2ML IJ SOLN
INTRAMUSCULAR | Status: DC | PRN
  Administered 2021-03-29: .5 mg via INTRAVENOUS

## 2021-03-29 MED ORDER — SODIUM CHLORIDE 0.9 % IV SOLN
250.0000 mL | INTRAVENOUS | Status: DC | PRN
  Administered 2021-03-30 (×2): 250 mL via INTRAVENOUS

## 2021-03-29 MED ORDER — HEPARIN BOLUS VIA INFUSION
2900.0000 [IU] | Freq: Once | INTRAVENOUS | Status: AC
  Administered 2021-03-29: 2900 [IU] via INTRAVENOUS
  Filled 2021-03-29: qty 2900

## 2021-03-29 MED ORDER — ONDANSETRON HCL 4 MG/2ML IJ SOLN
INTRAMUSCULAR | Status: AC
  Filled 2021-03-29: qty 2

## 2021-03-29 MED ORDER — POTASSIUM CHLORIDE 10 MEQ/100ML IV SOLN
10.0000 meq | INTRAVENOUS | Status: AC
  Administered 2021-03-29 (×2): 10 meq via INTRAVENOUS
  Filled 2021-03-29 (×2): qty 100

## 2021-03-29 MED ORDER — FENTANYL CITRATE (PF) 100 MCG/2ML IJ SOLN
INTRAMUSCULAR | Status: AC
  Filled 2021-03-29: qty 2

## 2021-03-29 MED ORDER — LIDOCAINE HCL (PF) 1 % IJ SOLN
INTRAMUSCULAR | Status: AC
  Filled 2021-03-29: qty 30

## 2021-03-29 MED ORDER — ASPIRIN 81 MG PO CHEW: 81.0000 mg | CHEWABLE_TABLET | ORAL | Status: DC

## 2021-03-29 MED ORDER — NITROGLYCERIN IN D5W 200-5 MCG/ML-% IV SOLN
0.0000 ug/min | INTRAVENOUS | Status: DC
  Administered 2021-03-29: 20 ug/min via INTRAVENOUS
  Filled 2021-03-29: qty 250

## 2021-03-29 MED ORDER — FUROSEMIDE 10 MG/ML IJ SOLN
40.0000 mg | Freq: Two times a day (BID) | INTRAMUSCULAR | Status: DC
  Administered 2021-03-29: 40 mg via INTRAVENOUS
  Filled 2021-03-29: qty 4

## 2021-03-29 MED ORDER — ONDANSETRON HCL 4 MG/2ML IJ SOLN
INTRAMUSCULAR | Status: DC | PRN
  Administered 2021-03-29: 4 mg via INTRAVENOUS

## 2021-03-29 MED ORDER — VERAPAMIL HCL 2.5 MG/ML IV SOLN
INTRAVENOUS | Status: AC
  Filled 2021-03-29: qty 2

## 2021-03-29 MED ORDER — ATROPINE SULFATE 1 MG/10ML IJ SOSY
PREFILLED_SYRINGE | INTRAMUSCULAR | Status: AC
  Filled 2021-03-29: qty 10

## 2021-03-29 MED ORDER — LIDOCAINE HCL (PF) 1 % IJ SOLN
INTRAMUSCULAR | Status: DC | PRN
  Administered 2021-03-29: 2 mL
  Administered 2021-03-29: 10 mL

## 2021-03-29 MED ORDER — TRAZODONE HCL 50 MG PO TABS
50.0000 mg | ORAL_TABLET | Freq: Every evening | ORAL | Status: DC | PRN
  Filled 2021-03-29: qty 1

## 2021-03-29 MED ORDER — HEPARIN (PORCINE) IN NACL 1000-0.9 UT/500ML-% IV SOLN
INTRAVENOUS | Status: DC | PRN
  Administered 2021-03-29 (×2): 500 mL

## 2021-03-29 MED ORDER — ONDANSETRON HCL 4 MG/2ML IJ SOLN
4.0000 mg | Freq: Four times a day (QID) | INTRAMUSCULAR | Status: DC | PRN
  Administered 2021-04-01: 4 mg via INTRAVENOUS
  Filled 2021-03-29: qty 2

## 2021-03-29 SURGICAL SUPPLY — 17 items
CATH BALLN WEDGE 5F 110CM (CATHETERS) ×1 IMPLANT
CATH INFINITI 5 FR JL3.5 (CATHETERS) ×1 IMPLANT
CATH INFINITI 5 FR JR3.5 (CATHETERS) ×1 IMPLANT
CATH INFINITI JR4 5F (CATHETERS) ×1 IMPLANT
DEVICE RAD COMP TR BAND LRG (VASCULAR PRODUCTS) ×1 IMPLANT
GLIDESHEATH SLEND SS 6F .021 (SHEATH) ×1 IMPLANT
GUIDEWIRE .025 260CM (WIRE) ×1 IMPLANT
GUIDEWIRE INQWIRE 1.5J.035X260 (WIRE) IMPLANT
INQWIRE 1.5J .035X260CM (WIRE) ×3
KIT CV 3L 7FR 20CM SULFAFREE (SET/KITS/TRAYS/PACK) ×1 IMPLANT
PACK CARDIAC CATHETERIZATION (CUSTOM PROCEDURE TRAY) ×4 IMPLANT
SHEATH GLIDE SLENDER 4/5FR (SHEATH) ×1 IMPLANT
SHEATH PINNACLE 5F 10CM (SHEATH) ×1 IMPLANT
SHEATH PINNACLE 6F 10CM (SHEATH) ×1 IMPLANT
TRANSDUCER W/STOPCOCK (MISCELLANEOUS) ×4 IMPLANT
WIRE EMERALD 3MM-J .035X150CM (WIRE) ×1 IMPLANT
WIRE HI TORQ VERSACORE-J 145CM (WIRE) ×1 IMPLANT

## 2021-03-29 NOTE — Progress Notes (Signed)
ANTICOAGULATION CONSULT NOTE - Initial Consult  Pharmacy Consult for heparin Indication: chest pain/ACS  Allergies  Allergen Reactions   Chantix [Varenicline] Nausea And Vomiting   Codeine Other (See Comments)    GI Upset, headaches      Patient Measurements: Height: 5\' 3"  (160 cm) Weight: 48.5 kg (107 lb) IBW/kg (Calculated) : 52.4 Heparin Dosing Weight: 48.5 kg   Vital Signs: Temp: 97.8 F (36.6 C) (01/16 0917) Temp Source: Oral (01/16 0917) BP: 115/64 (01/16 1130) Pulse Rate: 89 (01/16 1130)  Labs: Recent Labs    03/29/21 0932  HGB 14.2  HCT 41.4  PLT 295  CREATININE 0.71  TROPONINIHS 354*    Estimated Creatinine Clearance: 54.4 mL/min (by C-G formula based on SCr of 0.71 mg/dL).   Medical History: Past Medical History:  Diagnosis Date   Breast cancer, left ([place]) 2018   CHF (congestive heart failure) ([facility])    Coronary artery disease    Family history of adverse reaction to anesthesia    "siblings, parents get PONV too" (03/15/2017)   NSTEMI (non-ST elevated myocardial infarction) ([place]) 03/14/2017   PONV (postoperative nausea and vomiting)     Medications:  Scheduled:   sodium chloride flush  3 mL Intravenous Q12H    Assessment: 32 yof who presented with CP this morning - no AC PTA.  Plan for L/R heart cath today - trop 354. Hgb 14.2, plt 295. No s/sx of bleeding.   Goal of Therapy:  Heparin level 0.3-0.7 units/ml Monitor platelets by anticoagulation protocol: Yes   Plan:  Give 2900 units bolus x 1 Start heparin infusion at 550 units/hr Check anti-Xa level in 6 hours and daily while on heparin Continue to monitor H&H and platelets  Antonietta Jewel, PharmD, [place] Pharmacist  Phone: [phone] 03/29/2021 11:54 AM  Please check AMION for all [place] phone numbers After 10:00 PM, call [place] [phone]

## 2021-03-29 NOTE — Telephone Encounter (Signed)
Pts sister Ivin Booty called to notify our office that pt was taken by EMS to [facility] ED. She asked that we inform Dr.Bensimhon.   Msg routed to [place]

## 2021-03-29 NOTE — H&P (Addendum)
Advanced Heart Failure Team History and Physical Note   PCP:  [name], MD  PCP-Cardiology: Quay Burow, MD     Reason for Admission: Chest Pain/ Dyspnea    HPI:    Robin Rose is a 66 y.o. female with h/o tobacco abuse, breast cancer (s/p chemo and left mastectomy) and systolic HF.     In 8/17 she was diagnosed with triple-negative breast cancer with lymph node involvement. She underwent neoadjuvant chemotherapy including adriamycin (6 cycles)  followed by left mastectomy in 2/18 and XRT (completed 4/18).    Her EF 11/05/15 was 55% and by MUGA 04/06/16 was 69%. She was admitted to [facility] 5/18  with acute HF. EF was 20-25% by echo on 08/03/16.  Cath showed severe 3vCAD - not candidate for CABG due to diffuse CAD and size.  Failed milrinone attempt in 1/19 due to worsening tachycardia. Has been managed medically.   Not candidate for advanced therapies and has failed inotropes. We offered her an evaluation at [facility] for 2nd opinion but she has declined.   Saw Dr Haroldine Laws 03/16/21 due to increased dyspnea. Placed on lasix 20 mg M/F. Over the last 7 days she has been feeling poorly. Orthopnea. Unable o sleep in the bed. Had to sleep on the couch. She did not take lasix on Friday but took lasix Sat/Sun.   Woke up early this morning with 10/10 chest pain and felt like she had an elephant on her chest. She took asa +NTG x3. Called EMS. Code STEMI initially called but cancelled by Dr Angelena Form. HS Trop  354 . WBC 13. SARS2 negative. CXR with tiny pleural effusion and suspected vascualr congestion.    Complaining of chest discomfort 4/10.    Echo 3/22 EF 25-30% moderate RV HK   Echo 10/03/19 EF 20-25% RV ok  Echo (08/15/18) EF ~25%    Cardiac studies:   CPX 11/18 FVC 2.01 (66%)      FEV1 1.53 (64%)        FEV1/FVC 76 (96%)        MVV 63 (70%)  Resting HR: 88 Peak HR: 126   (79% age predicted max HR) BP rest: 124/76 BP peak: 158/70 Peak VO2: 16.8 (67%  predicted peak VO2) VE/VCO2 slope:  48 OUES: 0.71 Peak RER: 1.07 Ventilatory Threshold: 12.7 (50% predicted and 76% measured peak VO2) VE/MVV:  51% O2pulse:  7   (100% predicted O2pulse)    RHC/LHC 03/16/2017  Mid LAD-1 lesion is 40% stenosed. Mid LAD-2 lesion is 95% stenosed. Ost 2nd Diag to 2nd Diag lesion is 50% stenosed. Ost Cx to Prox Cx lesion is 60% stenosed. 1st Mrg lesion is 100% stenosed. Prox RCA lesion is 90% stenosed. Mid RCA lesion is 30% stenosed. Post Atrio lesion is 100% stenosed. Dist RCA lesion is 100% stenosed. Mid Cx lesion is 95% stenosed. Dist Cx lesion is 60% stenosed.  Findings:  Ao = 142/80 (105) LV = 158/25 RA = 1 RV = 29/2 PA = 30/19 (20) PCW = 20 Fick cardiac output/index = 2.4/1.7 PVR = 5.0 WU SVR = 3452  FA sat = 97% PA sat = 57%, 58%  Assessment: 1. Severe 3v CAD unchanged from previous. No obvious culprit lesion for NSTEMI 2. Severe ischemic CM with EF 15% and low output physiology       Review of Systems: [y] = yes, [ ]  = no   General: Weight gain [ ] ; Weight loss [ ] ; Anorexia [ ] ; Fatigue [  Y]; Fever [ ] ; Chills [ ] ; Weakness [ Y]  Cardiac: Chest pain/pressure [ ] ; Resting SOB [ ] ; Exertional SOB [ ] ; Orthopnea [ Y]; Pedal Edema [ ] ; Palpitations [ ] ; Syncope [ ] ; Presyncope [ ] ; Paroxysmal nocturnal dyspnea[ ]   Pulmonary: Cough [ ] ; Wheezing[ ] ; Hemoptysis[ ] ; Sputum [ ] ; Snoring [ ]   GI: Vomiting[ ] ; Dysphagia[ ] ; Melena[ ] ; Hematochezia [ ] ; Heartburn[ ] ; Abdominal pain [ ] ; Constipation [ ] ; Diarrhea [ ] ; BRBPR [ ]   GU: Hematuria[ ] ; Dysuria [ ] ; Nocturia[ ]   Vascular: Pain in legs with walking [ ] ; Pain in feet with lying flat [ ] ; Non-healing sores [ ] ; Stroke [ ] ; TIA [ ] ; Slurred speech [ ] ;  Neuro: Headaches[ ] ; Vertigo[ ] ; Seizures[ ] ; Paresthesias[ ] ;Blurred vision [ ] ; Diplopia [ ] ; Vision changes [ ]   Ortho/Skin: Arthritis [ ] ; Joint pain [ ] ; Muscle pain [ ] ; Joint swelling [ ] ; Back Pain [ ] ; Rash [ ]   Psych:  Depression[ ] ; Anxiety[ ]   Heme: Bleeding problems [ ] ; Clotting disorders [ ] ; Anemia [ ]   Endocrine: Diabetes [ ] ; Thyroid dysfunction[ ]    Home Medications Prior to Admission medications   Medication Sig Start Date End Date Taking? Authorizing Provider  acetaminophen (TYLENOL) 500 MG tablet Take 1,000 mg by mouth daily as needed for moderate pain or headache.    Yes [provider]  aspirin 81 MG chewable tablet Chew 1 tablet (81 mg total) by mouth daily. 03/20/17  Yes Duke, Tami Lin, PA  digoxin (LANOXIN) 0.125 MG tablet TAKE 1/2 TABLETS (0.0625 MG TOTAL) BY MOUTH DAILY. Patient taking differently: Take 0.0625 mg by mouth daily. 11/10/20  Yes Cary Lothrop, Shaune Pascal, MD  diphenhydrAMINE (BENADRYL) 25 mg capsule Take 25 mg by mouth every 6 (six) hours as needed for allergies.   Yes [provider]  furosemide (LASIX) 20 MG tablet Take 1 tablet (20 mg total) by mouth 2 (two) times a week. Every Monday and Friday 03/18/21  Yes Ethelle Ola, Shaune Pascal, MD  ivabradine (CORLANOR) 7.5 MG TABS tablet Take 1 tablet (7.5 mg total) by mouth 2 (two) times daily with a meal. 02/22/21  Yes Sanchez Hemmer, Shaune Pascal, MD  nitroGLYCERIN (NITROSTAT) 0.4 MG SL tablet Place 1 tablet (0.4 mg total) under the tongue every 5 (five) minutes as needed for chest pain. 06/03/20 03/16/22 Yes Ralyn Stlaurent, Shaune Pascal, MD  ondansetron (ZOFRAN) 4 MG tablet Take 1 tablet (4 mg total) by mouth every 4 (four) hours as needed. 06/03/20  Yes Aaryan Essman, Shaune Pascal, MD  oxymetazoline (AFRIN) 0.05 % nasal spray Place 1 spray into both nostrils at bedtime as needed for congestion.   Yes [provider]  potassium chloride (KLOR-CON) 10 MEQ tablet Take 10 mEq by mouth daily as needed.   Yes [provider]  traZODone (DESYREL) 50 MG tablet Take 1 tablet (50 mg total) by mouth at bedtime as needed for sleep. Patient not taking: Reported on 03/29/2021 07/08/20   Javiana Anwar, Shaune Pascal, MD    Past Medical History: Past  Medical History:  Diagnosis Date   Breast cancer, left ([place]) 2018   CHF (congestive heart failure) ([place])    Coronary artery disease    Family history of adverse reaction to anesthesia    "siblings, parents get PONV too" (03/15/2017)   NSTEMI (non-ST elevated myocardial infarction) ([place]) 03/14/2017   PONV (postoperative nausea and vomiting)     Past Surgical History: Past Surgical History:  Procedure Laterality Date  BREAST BIOPSY Left 10/2015   CARDIAC CATHETERIZATION     GANGLION CYST EXCISION Left 1990s   IR FLUORO GUIDE CV LINE RIGHT  03/17/2017   IR US GUIDE VASC ACCESS RIGHT  03/17/2017   MASTECTOMY Left 04/20/2016   PORTA CATH INSERTION Right 10/2015   PORTA CATH REMOVAL Right ~ 01/2017   RIGHT HEART CATH N/A 02/13/2017   Procedure: RIGHT HEART CATH;  Surgeon: Jolaine Artist, MD;  Location: [place] [facility];  Service: Cardiovascular;  Laterality: N/A;   RIGHT/LEFT HEART CATH AND CORONARY ANGIOGRAPHY N/A 11/07/2016   Procedure: RIGHT/LEFT HEART CATH AND CORONARY ANGIOGRAPHY;  Surgeon: Jolaine Artist, MD;  Location: [place] [facility];  Service: Cardiovascular;  Laterality: N/A;   RIGHT/LEFT HEART CATH AND CORONARY ANGIOGRAPHY N/A 03/16/2017   Procedure: RIGHT/LEFT HEART CATH AND CORONARY ANGIOGRAPHY;  Surgeon: Jolaine Artist, MD;  Location: [place] [facility];  Service: Cardiovascular;  Laterality: N/A;   TUBAL LIGATION  1982   ULTRASOUND GUIDANCE FOR VASCULAR ACCESS  03/16/2017   Procedure: Ultrasound Guidance For Vascular Access;  Surgeon: Jolaine Artist, MD;  Location: [place] [facility];  Service: Cardiovascular;;   VAGINAL HYSTERECTOMY  1988    Family History:  Heart disease Mother - + CAD with CABG at age 54  Kidney cancer Mother   Heart disease Father  - CABG and AVR in 43s  Breast cancer Paternal 66   Breast cancer Cousin   Colon cancer Neg Hx    - 3 sisters - no CAD     Social History: Social History   Socioeconomic History   Marital status:  Divorced    Spouse name: Not on file   Number of children: Not on file   Years of education: Not on file   Highest education level: Not on file  Occupational History   Not on file  Tobacco Use   Smoking status: Light Smoker    Years: 38.00    Types: Cigarettes   Smokeless tobacco: Never  Vaping Use   Vaping Use: Never used  Substance and Sexual Activity   Alcohol use: No   Drug use: No   Sexual activity: Not Currently  Other Topics Concern   Not on file  Social History Narrative   Not on file   Social Determinants of Health   Financial Resource Strain: Not on file  Food Insecurity: Not on file  Transportation Needs: Not on file  Physical Activity: Not on file  Stress: Not on file  Social Connections: Not on file    Allergies:  Allergies  Allergen Reactions   Chantix [Varenicline] Nausea And Vomiting   Codeine Other (See Comments)    GI Upset, headaches      Objective:    Vital Signs:   Temp:  [97.8 F (36.6 C)] 97.8 F (36.6 C) (01/16 0917) Pulse Rate:  [90-92] 90 (01/16 0945) Resp:  [19-28] 19 (01/16 0945) BP: (112-117)/(71-77) 117/71 (01/16 0945) SpO2:  [98 %-100 %] 100 % (01/16 0945) Weight:  [48.5 kg] 48.5 kg (01/16 0918)   Filed Weights   03/29/21 0918  Weight: 48.5 kg     Physical Exam     General:  Thin. . No respiratory difficulty HEENT: Normal Neck: Supple. no JVD. Carotids 2+ bilat; no bruits. No lymphadenopathy or thyromegaly appreciated. Cor: PMI nondisplaced. Regular rate & rhythm. No rubs, gallops or murmurs. Lungs: Clear Abdomen: Soft, nontender, nondistended. No hepatosplenomegaly. No bruits or masses. Good bowel sounds. Extremities: No cyanosis,  clubbing, rash, edema Neuro: Alert & oriented x 3, cranial nerves grossly intact. moves all 4 extremities w/o difficulty. Affect pleasant.   Telemetry  SR 80s   EKG   SR 92 bpm   Labs     Basic Metabolic Panel: No results for input(s): NA, K, CL, CO2, GLUCOSE, BUN, CREATININE,  CALCIUM, MG, PHOS in the last 168 hours.  Liver Function Tests: No results for input(s): AST, ALT, ALKPHOS, BILITOT, PROT, ALBUMIN in the last 168 hours. No results for input(s): LIPASE, AMYLASE in the last 168 hours. No results for input(s): AMMONIA in the last 168 hours.  CBC: Recent Labs  Lab 03/29/21 0932  WBC 13.3*  NEUTROABS 10.4*  HGB 14.2  HCT 41.4  MCV 98.1  PLT 295    Cardiac Enzymes: No results for input(s): CKTOTAL, CKMB, CKMBINDEX, TROPONINI in the last 168 hours.  BNP: BNP (last 3 results) Recent Labs    03/16/21 1535  BNP 648.7*    ProBNP (last 3 results) No results for input(s): PROBNP in the last 8760 hours.   CBG: No results for input(s): GLUCAP in the last 168 hours.  Coagulation Studies: No results for input(s): LABPROT, INR in the last 72 hours.  Imaging: DG Chest 2 View  Result Date: 03/29/2021 CLINICAL DATA:  Chest pain. EXAM: CHEST - 2 VIEW COMPARISON:  05/29/2020 FINDINGS: There is again pectus excavatum. Mild biapical pleural thickening is similar to prior. Chronic right greater than left apical pleural calcifications as better seen on prior CT. Cardiac silhouette is again mildly to moderately enlarged. Mediastinal contours are within normal limits with mild calcification within the aortic arch. New blunting of the posterior costophrenic angle on lateral view with mild basilar heterogeneous airspace opacity. No pneumothorax. Mild multilevel degenerative disc changes of the thoracic spine. IMPRESSION: Likely new tiny pleural effusions with mild basilar airspace opacity may represent atelectasis. Cannot exclude underlying pneumonia. Electronically Signed   By: Yvonne Kendall M.D.   On: 03/29/2021 09:46     Patient Profile   Robin Rose is a 66 y.o. female with h/o tobacco abuse, breast cancer (s/p chemo and left mastectomy), CAD, and systolic HF.      Assessment/Plan   Chest Pain  -HS Trop 354 -> pending  -s/p NSTEM 1/19/  LHC 03/2017  Severe 3v CAD unchanged from previous. - No obvious culprit lesion for NSTEMI - continue ASA/statin -Crushing chest pain this morning. Had 3 SL NTG.  - Pain currently 4/10.  - Set up for cath today. Start heparin drip.   2. A/C HFrEF , ICM  -Failed milrinone attempt in 03/2017 due to worsening tachycardia - Not candidate for advanced therapies and has failed inotropes. We offered her an evaluation at [facility] for 2nd opinion but she has declined. -Echo 2022 EF 25-30%.  - Volume status mildly elevated. CXR with some congestion. She had taken 20 mg po lasix Sat/Sun. Give 20 mg IV lasix x1 and adjust post cath. - Continue digoxin. Check level.  - Continue ivabradine 7.5 mg twice a day   3. Hypokalemia K 2.9. Give 40 meq K now and another 40 meq in 2 hours.   4. Breast Cancer, left Triple negative. S/p Adjuvant chemotherapy followed by left mastectomy and LN dissection 2/18 and XRT.   5. Tobacco Abuse   Check labs now dig level and BNP.    Admit and plan for LHC/RHC today.  Darrick Grinder, NP 03/29/2021, 10:27 AM  Advanced Heart Failure Team Pager [phone] (M-F;  7a - 5p)  Please contact [facility] for night-coverage after hours (4p -7a ) and weekends on [email or web address]  Patient seen and examined with the above-signed Advanced Practice Provider and/or Housestaff. I personally reviewed laboratory data, imaging studies and relevant notes. I independently examined the patient and formulated the important aspects of the plan. I have edited the note to reflect any of my changes or salient points. I have personally discussed the plan with the patient and/or family.  65 y/o woman as above with ongoing tobacco use, severe CAD and systolic HF duet o ischemic CM EF 25%  Seen in clinic last week for several episodes of CP which improved.   Now presents with USA/NSTEMI with elevated troponin. Pain free with NTG  General:  Frail No resp difficulty HEENT: normal Neck: supple. no JVD.  Carotids 2+ bilat; no bruits. No lymphadenopathy or thryomegaly appreciated. Cor: PMI nondisplaced. Regular rate & rhythm. No rubs, gallops or murmurs. Lungs: decreased throughout  Abdomen: soft, nontender, nondistended. No hepatosplenomegaly. No bruits or masses. Good bowel sounds. Extremities: no cyanosis, clubbing, rash, edema Neuro: alert & orientedx3, cranial nerves grossly intact. moves all 4 extremities w/o difficulty. Affect pleasant  She has clear NSTEMI. Known severe 3v CAD with limited options. Will plan coronary angio to see if any targets for PCI. D/w Interventional team.   Start heparin, DAPT.   [name], MD  2:25 PM

## 2021-03-29 NOTE — Progress Notes (Signed)
°  Code status discussed with patient.   In case of cardiac or respiratory compromise she would not want intubation or heroic measures.   Updated chart to DNR/DNI.   Her sister - who is her 87 - is aware as well.    [name], MD

## 2021-03-29 NOTE — Interval H&P Note (Signed)
History and Physical Interval Note:  03/29/2021 5:48 PM  Robin Rose  has presented today for surgery, with the diagnosis of chf/NSTEMI.  The various methods of treatment have been discussed with the patient and family. After consideration of risks, benefits and other options for treatment, the patient has consented to  Procedure(s): RIGHT/LEFT HEART CATH AND CORONARY ANGIOGRAPHY (N/A) and possible coronary angioplasty as a surgical intervention.  The patient's history has been reviewed, patient examined, no change in status, stable for surgery.  I have reviewed the patient's chart and labs.  Questions were answered to the patient's satisfaction.     [name]

## 2021-03-29 NOTE — Progress Notes (Signed)
ANTICOAGULATION CONSULT NOTE  Pharmacy Consult for heparin Indication: chest pain/ACS  Allergies  Allergen Reactions   Chantix [Varenicline] Nausea And Vomiting   Codeine Other (See Comments)    GI Upset, headaches      Patient Measurements: Height: 5\' 3"  (160 cm) Weight: 48.5 kg (107 lb) IBW/kg (Calculated) : 52.4 Heparin Dosing Weight: 48.5 kg   Vital Signs: Temp: 98.2 F (36.8 C) (01/16 2000) Temp Source: Oral (01/16 2000) BP: 108/81 (01/16 2013) Pulse Rate: 86 (01/16 1645)  Labs: Recent Labs    03/29/21 0932 03/29/21 1257 03/29/21 1843 03/29/21 1847  HGB 14.2  --  12.2 14.3  HCT 41.4  --  36.0 42.0  PLT 295  --   --   --   CREATININE 0.71  --   --   --   TROPONINIHS 354* 777*  --   --      Estimated Creatinine Clearance: 54.4 mL/min (by C-G formula based on SCr of 0.71 mg/dL).   Medical History: Past Medical History:  Diagnosis Date   Breast cancer, left ([place]) 2018   CHF (congestive heart failure) ([facility])    Coronary artery disease    Family history of adverse reaction to anesthesia    "siblings, parents get PONV too" (03/15/2017)   NSTEMI (non-ST elevated myocardial infarction) ([place]) 03/14/2017   PONV (postoperative nausea and vomiting)     Medications:  Scheduled:   [START ON 03/30/2021] aspirin  81 mg Oral Daily   Chlorhexidine Gluconate Cloth  6 each Topical Daily   digoxin  0.0625 mg Oral Daily   [START ON 03/30/2021] ivabradine  7.5 mg Oral BID WC   sodium chloride flush  3 mL Intravenous Q12H    Assessment: 52 yof who presented with CP this morning - no AC PTA.  Pt s/p LHC with mvCAD. Heparin to resume 8h after sheaths removed - femoral sheath still in place, about to be pulled soon.  Goal of Therapy:  Heparin level 0.3-0.7 units/ml Monitor platelets by anticoagulation protocol: Yes   Plan:  Heparin 550 units/h no bolus starting at 0700 Check heparin level 6h after restart  Arrie Senate, PharmD, BCPS, [facility] Clinical  Pharmacist [phone] Please check AMION for all [facility] numbers 03/29/2021

## 2021-03-29 NOTE — ED Notes (Addendum)
Pt not tolerating IV potassium at prescribed infusion rate, c/o burning. Administration significantly delayed d/t lowering rate of infusion initially.

## 2021-03-29 NOTE — ED Notes (Signed)
Pt was only able to swallow one potassium pill, the other one got stuck and came back up. Pt refused further PO potassium.

## 2021-03-29 NOTE — ED Triage Notes (Signed)
Pt BIB EMS for CP starting 0400, crushing central CP without radiation. Took 2 nitro and 324 ASA prior to EMS arrival, an additional nitro with EMS. Cardiology present on arrival, cancel code STEMI at 0906, proceed with chest pain workup. Pt VSS, in no acute distress.

## 2021-03-29 NOTE — ED Provider Notes (Signed)
[facility] EMERGENCY DEPARTMENT Provider Note   CSN: [id number] Arrival date & time: 03/29/21  0857     History  Chief Complaint  Patient presents with   Chest Pain    Robin Rose Karyna Bessler is a 66 y.o. female.  With past medical history of PAD, breast cancer s/p left mastectomy NSTEMI, ischemic cardiomyopathy, systolic heart failure with most recent Echo 06/03/2020 with EF 25 to 30%, with decreased LV function, who presents to the emergency department with chest pain.  Patient states that around 430 this morning she woke up with severe, 10/10, central chest pain that she describes as a "elephant sitting on her chest."  She states that this is associated with shortness of breath, nausea, diaphoresis.  She states that she got up and took 324 mg of aspirin as well as 1 nitroglycerin with mild relief of symptoms and called EMS.  When EMS arrived patient was given another dose of sublingual nitroglycerin with further relief of symptoms.  She was initially called as a STEMI, which was downgraded on arrival by Dr. Angelena Form.  Patient states that she currently has 5/10 chest pain and still feels mildly short of breath.  Additionally she notes that she has had upper respiratory symptoms that since last Wednesday.  She has had cough and rhinorrhea.  She denies any fevers.   Chest Pain Associated symptoms: cough, diaphoresis, nausea and shortness of breath   Associated symptoms: no abdominal pain, no dizziness, no fever, no headache, no palpitations and no vomiting    HPI: A 67 year old patient with a history of peripheral artery disease presents for evaluation of chest pain. Initial onset of pain was less than one hour ago. The patient's chest pain is described as heaviness/pressure/tightness, is not worse with exertion and is relieved by nitroglycerin. The patient complains of nausea and reports some diaphoresis. The patient's chest pain is middle- or left-sided, is not  well-localized, is not sharp and does not radiate to the arms/jaw/neck. The patient has no history of stroke, has not smoked in the past 90 days, denies any history of treated diabetes, has no relevant family history of coronary artery disease (first degree relative at less than age 72), is not hypertensive, has no history of hypercholesterolemia and does not have an elevated BMI (>=30).   Home Medications Prior to Admission medications   Medication Sig Start Date End Date Taking? Authorizing Provider  acetaminophen (TYLENOL) 500 MG tablet Take 1,000 mg by mouth daily as needed for moderate pain or headache.     [provider]  aspirin 81 MG chewable tablet Chew 1 tablet (81 mg total) by mouth daily. 03/20/17   Duke, Tami Lin, PA  digoxin (LANOXIN) 0.125 MG tablet TAKE 1/2 TABLETS (0.0625 MG TOTAL) BY MOUTH DAILY. 11/10/20   Bensimhon, Shaune Pascal, MD  diphenhydrAMINE (BENADRYL) 25 mg capsule Take 25 mg by mouth every 6 (six) hours as needed for allergies.    [provider]  furosemide (LASIX) 20 MG tablet Take 1 tablet (20 mg total) by mouth 2 (two) times a week. Every Monday and Friday 03/18/21   Bensimhon, Shaune Pascal, MD  ivabradine (CORLANOR) 7.5 MG TABS tablet Take 1 tablet (7.5 mg total) by mouth 2 (two) times daily with a meal. 02/22/21   Bensimhon, Shaune Pascal, MD  nitroGLYCERIN (NITROSTAT) 0.4 MG SL tablet Place 1 tablet (0.4 mg total) under the tongue every 5 (five) minutes as needed for chest pain. 06/03/20 03/16/22  Bensimhon, Shaune Pascal, MD  ondansetron (ZOFRAN) 4 MG tablet Take 1 tablet (4 mg total) by mouth every 4 (four) hours as needed. 06/03/20   Bensimhon, Shaune Pascal, MD  oxymetazoline (AFRIN) 0.05 % nasal spray Place 1 spray into both nostrils at bedtime as needed for congestion.    [provider]  potassium chloride (KLOR-CON) 10 MEQ tablet Take 10 mEq by mouth daily as needed.    [provider]  traZODone (DESYREL) 50 MG tablet Take 1 tablet (50 mg total)  by mouth at bedtime as needed for sleep. 07/08/20   Bensimhon, Shaune Pascal, MD      Allergies    Chantix [varenicline] and Codeine    Review of Systems   Review of Systems  Constitutional:  Positive for diaphoresis. Negative for fever.  HENT:  Positive for congestion and rhinorrhea.   Respiratory:  Positive for cough and shortness of breath.   Cardiovascular:  Positive for chest pain. Negative for palpitations and leg swelling.  Gastrointestinal:  Positive for nausea. Negative for abdominal pain and vomiting.  Neurological:  Negative for dizziness, syncope, light-headedness and headaches.  All other systems reviewed and are negative.  Physical Exam Updated Vital Signs BP 112/77 (BP Location: Right Arm)    Pulse 92    Temp 97.8 F (36.6 C) (Oral)    Resp (!) 28    Ht 5\' 3"  (1.6 m)    Wt 48.5 kg    SpO2 100%    BMI 18.95 kg/m  Physical Exam Vitals and nursing note reviewed.  Constitutional:      General: She is not in acute distress.    Appearance: She is well-developed and normal weight. She is ill-appearing. She is not toxic-appearing.  HENT:     Head: Normocephalic and atraumatic.     Nose: Congestion present.     Mouth/Throat:     Mouth: Mucous membranes are moist.     Pharynx: Oropharynx is clear.  Eyes:     General: No scleral icterus.    Extraocular Movements: Extraocular movements intact.     Pupils: Pupils are equal, round, and reactive to light.  Neck:     Vascular: No JVD.  Cardiovascular:     Rate and Rhythm: Normal rate and regular rhythm.     Pulses:          Radial pulses are 2+ on the right side and 2+ on the left side.       Dorsalis pedis pulses are 2+ on the right side and 2+ on the left side.     Heart sounds: Normal heart sounds. No murmur heard. Pulmonary:     Effort: Pulmonary effort is normal. Tachypnea present. No respiratory distress.     Breath sounds: Examination of the right-lower field reveals decreased breath sounds. Examination of the left-lower  field reveals decreased breath sounds. Decreased breath sounds present.  Chest:     Chest wall: No tenderness.  Abdominal:     General: Bowel sounds are normal.     Palpations: Abdomen is soft.     Tenderness: There is no abdominal tenderness.  Musculoskeletal:        General: Normal range of motion.     Cervical back: Normal range of motion and neck supple.     Right lower leg: No tenderness. No edema.     Left lower leg: No tenderness. No edema.  Skin:    General: Skin is warm and dry.     Capillary Refill: Capillary refill takes less than  2 seconds.     Findings: No rash.  Neurological:     General: No focal deficit present.     Mental Status: She is alert and oriented to person, place, and time.  Psychiatric:        Mood and Affect: Mood normal.        Behavior: Behavior normal.        Thought Content: Thought content normal.        Judgment: Judgment normal.    ED Results / Procedures / Treatments   Labs (all labs ordered are listed, but only abnormal results are displayed) Labs Reviewed  BASIC METABOLIC PANEL - Abnormal; Notable for the following components:      Result Value   Sodium 134 (*)    Potassium 2.9 (*)    Chloride 96 (*)    Glucose, Bld 145 (*)    All other components within normal limits  CBC WITH DIFFERENTIAL/PLATELET - Abnormal; Notable for the following components:   WBC 13.3 (*)    Neutro Abs 10.4 (*)    All other components within normal limits  TROPONIN I (HIGH SENSITIVITY) - Abnormal; Notable for the following components:   Troponin I (High Sensitivity) 354 (*)    All other components within normal limits  RESP PANEL BY RT-PCR (FLU A&B, COVID) [id number]  TROPONIN I (HIGH SENSITIVITY)   EKG EKG Interpretation  Date/Time:  Monday March 29 2021 09:15:22 EST Ventricular Rate:  92 PR Interval:  168 QRS Duration: 116 QT Interval:  400 QTC Calculation: 495 R Axis:   -50 Text Interpretation: Sinus rhythm Supraventricular bigeminy Right atrial  enlargement Incomplete right bundle branch block LVH with IVCD, LAD and secondary repol abnrm Inferior infarct, old similar to prior EKG Confirmed by Malvin Johns [phone]) on 03/29/2021 11:05:28 AM  Radiology DG Chest 2 View  Result Date: 03/29/2021 CLINICAL DATA:  Chest pain. EXAM: CHEST - 2 VIEW COMPARISON:  05/29/2020 FINDINGS: There is again pectus excavatum. Mild biapical pleural thickening is similar to prior. Chronic right greater than left apical pleural calcifications as better seen on prior CT. Cardiac silhouette is again mildly to moderately enlarged. Mediastinal contours are within normal limits with mild calcification within the aortic arch. New blunting of the posterior costophrenic angle on lateral view with mild basilar heterogeneous airspace opacity. No pneumothorax. Mild multilevel degenerative disc changes of the thoracic spine. IMPRESSION: Likely new tiny pleural effusions with mild basilar airspace opacity may represent atelectasis. Cannot exclude underlying pneumonia. Electronically Signed   By: Yvonne Kendall M.D.   On: 03/29/2021 09:46    Procedures .Critical Care Performed by: Mickie Hillier, PA-C Authorized by: Mickie Hillier, PA-C   Critical care provider statement:    Critical care time (minutes):  35   Critical care time was exclusive of:  Separately billable procedures and treating other patients   Critical care was necessary to treat or prevent imminent or life-threatening deterioration of the following conditions:  Cardiac failure   Critical care was time spent personally by me on the following activities:  Development of treatment plan with patient or surrogate, discussions with consultants, evaluation of patient's response to treatment, examination of patient, interpretation of cardiac output measurements, obtaining history from patient or surrogate, review of old charts, re-evaluation of patient's condition, pulse oximetry, ordering and review of radiographic  studies, ordering and review of laboratory studies and ordering and performing treatments and interventions   I assumed direction of critical care for this patient from another provider  in my specialty: no     Care discussed with: admitting provider      Medications Ordered in ED Medications  lip balm (CARMEX) ointment (has no administration in time range)  sodium chloride flush (NS) 0.9 % injection 3 mL (3 mLs Intravenous Not Given 03/29/21 1022)  heparin bolus via infusion 2,900 Units (has no administration in time range)  heparin ADULT infusion 100 units/mL (25000 units/233mL) (has no administration in time range)  acetaminophen (TYLENOL) tablet 650 mg (650 mg Oral Given 03/29/21 1006)    ED Course/ Medical Decision Making/ A&P   HEAR Score: 6                       Medical Decision Making Patient presents to the ED with complaints of chest pain. This involves an extensive number of treatment options, and is a complaint that carries with it a high risk of complications and morbidity.   Additional history obtained:  Additional history obtained from: EMS External records from outside source obtained and reviewed including: Previous emergency department visits, cardiology visits  EKG: Normal sinus rhythm, right atrial enlargement, incomplete right bundle branch block, left ventricular hypertrophy  Cardiac Monitoring: The patient was maintained on a cardiac monitor.  I personally viewed and interpreted the cardiac monitored which showed an underlying rhythm of: Normal sinus rhythm  Lab Results: I Ordered, reviewed, and interpreted labs. Pertinent results include: CBC with leukocytosis to 13.3, likely reactive versus nonspecific at this time BMP potassium 2.9, patient admitted at time of results of the BMP.  Patient going to Cath Lab, will allow admitting team to replete potassium Troponin 354, repeat troponin pending COVID and flu negative  Imaging Studies ordered: I ordered  imaging studies which included chest x-ray.  I independently reviewed & interpreted imaging & am in agreement with radiology impression. Imaging shows: Likely new tiny pleural effusions with mild basilar airspace opacity  may represent atelectasis. Cannot exclude underlying pneumonia   Medications: I ordered medication including Tylenol for headache secondary to nitroglycerin Reevaluation of the patient after medication shows that patient improved  Tests Considered: BNP  Critical Interventions: EKG and consult with cardiology  Consultations: Spoke with Estill Bamberg, STEMI RN.  Patient was also evaluated by Dr. Angelena Form at time of arrival as she was called as a code stroke.  They downgraded her from STEMI criteria, however admitting patient under Dr. Haroldine Laws who is her primary cardiologist.  ED Course: Patient presents to the emergency department with chest pain as a code STEMI.  At time of arrival the patient was downgraded from STEMI.  Cardiology at bedside feels that she does not meet STEMI criteria at this time.  Initial work-up includes EKG, chest x-ray, lab work including troponins as noted above.  Patient without worsening of symptoms while here in the emergency department.  Problem List:  Chest pain Chest pain concerning for cardiac etiology.  She had nausea as well as diaphoresis with the episode.  She has extensive cardiac history.  Pain improved with nitroglycerin. Initiated STEMI, which was downgraded Cardiac work-up initiated in the emergency department. She was given 324 mg aspirin, x2 nitroglycerin prior to arrival Oxygenating appropriately on room air in the emergency department  Dispostion:  After consideration of the diagnostic results and the patients response to treatment, I feel that the patent would benefit from admission.  Patient with evidence of NSTEMI with elevated troponins without new changes on EKG.  Spoke with Darrick Grinder, NP, with cardiology team who  is  admitting patient.  Patient was placed on heparin drip here in the emergency department given NSTEMI findings.  She will be going to the Cath Lab today.  Patient will maintain n.p.o. until she is admitted.  At current time patient does not have any active chest pain.  She is instructed to let ED team know if she has new or worsening chest pain while she is here in the emergency department prior to moving up to her room.  She verbalizes understanding.  Final Clinical Impression(s) / ED Diagnoses Final diagnoses:  NSTEMI (non-ST elevated myocardial infarction) [facility])    Rx / DC Orders ED Discharge Orders     None         Mickie Hillier, PA-C 03/29/21 1206    Malvin Johns, MD 03/29/21 1501

## 2021-03-29 NOTE — ED Notes (Signed)
Phebotomy at bedside, drew labs

## 2021-03-30 ENCOUNTER — Encounter (HOSPITAL_COMMUNITY): Payer: Self-pay | Admitting: Internal Medicine

## 2021-03-30 ENCOUNTER — Inpatient Hospital Stay (HOSPITAL_COMMUNITY): Payer: Medicare HMO

## 2021-03-30 DIAGNOSIS — Z515 Encounter for palliative care: Secondary | ICD-10-CM

## 2021-03-30 DIAGNOSIS — Z7189 Other specified counseling: Secondary | ICD-10-CM

## 2021-03-30 DIAGNOSIS — I5023 Acute on chronic systolic (congestive) heart failure: Secondary | ICD-10-CM | POA: Diagnosis not present

## 2021-03-30 LAB — COOXEMETRY PANEL
Carboxyhemoglobin: 1 % (ref 0.5–1.5)
Methemoglobin: 1 % (ref 0.0–1.5)
O2 Saturation: 53.1 %
Total hemoglobin: 14.6 g/dL (ref 12.0–16.0)

## 2021-03-30 LAB — HIV ANTIBODY (ROUTINE TESTING W REFLEX): HIV Screen 4th Generation wRfx: NONREACTIVE

## 2021-03-30 LAB — BASIC METABOLIC PANEL
Anion gap: 14 (ref 5–15)
BUN: 8 mg/dL (ref 8–23)
CO2: 28 mmol/L (ref 22–32)
Calcium: 8.9 mg/dL (ref 8.9–10.3)
Chloride: 91 mmol/L — ABNORMAL LOW (ref 98–111)
Creatinine, Ser: 0.82 mg/dL (ref 0.44–1.00)
GFR, Estimated: >60 mL/min (ref >60)
Glucose, Bld: 104 mg/dL — ABNORMAL HIGH (ref 70–99)
Potassium: 3.2 mmol/L — ABNORMAL LOW (ref 3.5–5.1)
Sodium: 133 mmol/L — ABNORMAL LOW (ref 135–145)

## 2021-03-30 LAB — LIPID PANEL
Cholesterol: 150 mg/dL (ref 0–200)
HDL: 42 mg/dL (ref >40)
LDL Cholesterol: 84 mg/dL (ref 0–99)
Total CHOL/HDL Ratio: 3.6 RATIO
Triglycerides: 118 mg/dL (ref <150)
VLDL: 24 mg/dL (ref 0–40)

## 2021-03-30 LAB — CBC
HCT: 40.8 % (ref 36.0–46.0)
Hemoglobin: 14.2 g/dL (ref 12.0–15.0)
MCH: 33.9 pg (ref 26.0–34.0)
MCHC: 34.8 g/dL (ref 30.0–36.0)
MCV: 97.4 fL (ref 80.0–100.0)
Platelets: 291 10*3/uL (ref 150–400)
RBC: 4.19 MIL/uL (ref 3.87–5.11)
RDW: 12.1 % (ref 11.5–15.5)
WBC: 11.5 10*3/uL — ABNORMAL HIGH (ref 4.0–10.5)
nRBC: 0 % (ref 0.0–0.2)

## 2021-03-30 LAB — HEPARIN LEVEL (UNFRACTIONATED): Heparin Unfractionated: <0.1 IU/mL — ABNORMAL LOW (ref 0.30–0.70)

## 2021-03-30 LAB — MAGNESIUM: Magnesium: 1.8 mg/dL (ref 1.7–2.4)

## 2021-03-30 MED ORDER — POTASSIUM CHLORIDE CRYS ER 10 MEQ PO TBCR
40.0000 meq | EXTENDED_RELEASE_TABLET | ORAL | Status: AC
  Administered 2021-03-30 (×2): 40 meq via ORAL
  Filled 2021-03-30 (×2): qty 4

## 2021-03-30 MED ORDER — GUAIFENESIN-DM 100-10 MG/5ML PO SYRP
5.0000 mL | ORAL_SOLUTION | ORAL | Status: DC | PRN
  Administered 2021-03-30: 5 mL via ORAL
  Filled 2021-03-30: qty 5

## 2021-03-30 MED ORDER — LORAZEPAM 2 MG/ML IJ SOLN
0.5000 mg | INTRAMUSCULAR | Status: DC | PRN
  Administered 2021-03-30 – 2021-04-01 (×2): 0.5 mg via INTRAVENOUS
  Filled 2021-03-30 (×2): qty 1

## 2021-03-30 MED ORDER — FUROSEMIDE 10 MG/ML IJ SOLN: 40.0000 mg | Freq: Once | INTRAMUSCULAR | Status: DC

## 2021-03-30 MED ORDER — POTASSIUM CHLORIDE CRYS ER 20 MEQ PO TBCR
40.0000 meq | EXTENDED_RELEASE_TABLET | ORAL | Status: DC
  Filled 2021-03-30: qty 2

## 2021-03-30 MED ORDER — GUAIFENESIN-DM 100-10 MG/5ML PO SYRP
10.0000 mL | ORAL_SOLUTION | ORAL | Status: DC | PRN
  Administered 2021-03-30 – 2021-04-01 (×5): 10 mL via ORAL
  Filled 2021-03-30 (×5): qty 10

## 2021-03-30 MED ORDER — MORPHINE SULFATE (PF) 2 MG/ML IV SOLN
1.0000 mg | INTRAVENOUS | Status: DC | PRN
  Administered 2021-03-31: 1 mg via INTRAVENOUS
  Filled 2021-03-30: qty 1

## 2021-03-30 MED ORDER — FENTANYL CITRATE PF 50 MCG/ML IJ SOSY
25.0000 ug | PREFILLED_SYRINGE | INTRAMUSCULAR | Status: DC | PRN
  Administered 2021-03-31: 25 ug via INTRAVENOUS
  Filled 2021-03-30 (×2): qty 1

## 2021-03-30 MED ORDER — MAGNESIUM SULFATE 2 GM/50ML IV SOLN
2.0000 g | Freq: Once | INTRAVENOUS | Status: AC
  Administered 2021-03-30: 2 g via INTRAVENOUS
  Filled 2021-03-30: qty 50

## 2021-03-30 MED ORDER — FUROSEMIDE 10 MG/ML IJ SOLN
20.0000 mg | Freq: Once | INTRAMUSCULAR | Status: AC
  Administered 2021-03-30: 20 mg via INTRAVENOUS
  Filled 2021-03-30: qty 2

## 2021-03-30 MED ORDER — ATORVASTATIN CALCIUM 80 MG PO TABS
80.0000 mg | ORAL_TABLET | Freq: Every day | ORAL | Status: DC
  Administered 2021-03-30 – 2021-03-31 (×2): 80 mg via ORAL
  Filled 2021-03-30 (×2): qty 1

## 2021-03-30 NOTE — Progress Notes (Addendum)
Advanced Heart Failure Rounding Note  PCP-Cardiologist: Quay Burow, MD   Subjective:   1/16 S/P LHC - critical 3V disease   RPAV lesion is 100% stenosed.   Mid LAD lesion is 95% stenosed.   Ost Cx to Prox Cx lesion is 95% stenosed.   Dist Cx lesion is 60% stenosed.   Prox RCA lesion is 90% stenosed.   Mid RCA lesion is 100% stenosed.   Ost LAD lesion is 99% stenosed.   Mid Cx lesion is 90% stenosed.   Ost 1st Mrg lesion is 90% stenosed.   Ost 2nd Diag to 2nd Diag lesion is 80% stenosed.   The left ventricular ejection fraction is less than 25% by visual estimate.  Findings:  Ao =114/66 (86) LV = 118/25 RA =  2 RV = 48/5 PA = 48/24 (34) PCW = 23 Fick cardiac output/index = 2.4/1.6 PVR = 4.6 WU Ao sat = 97% PA sat = 54%  Started on NTG drip post cath. Diuresed with IV lasix. Negative 1.1 liters.  Currently on NTG 5 mcg  + heparin.   Intermittent chest pain 4/10.     Objective:   Weight Range: 47.1 kg Body mass index is 18.39 kg/m.   Vital Signs:   Temp:  [97.8 F (36.6 C)-98.2 F (36.8 C)] 98.1 F (36.7 C) (01/17 0000) Pulse Rate:  [52-121] 103 (01/17 0700) Resp:  [0-43] 18 (01/17 0700) BP: (83-127)/(42-91) 108/79 (01/17 0700) SpO2:  [0 %-100 %] 95 % (01/17 0700) Arterial Line BP: (119-128)/(69-79) 128/79 (01/16 2225) Weight:  [47.1 kg-48.5 kg] 47.1 kg (01/17 0500) Last BM Date:  (PTA)  Weight change: Filed Weights   03/29/21 0918 03/29/21 2122 03/30/21 0500  Weight: 48.5 kg 47.3 kg 47.1 kg    Intake/Output:   Intake/Output Summary (Last 24 hours) at 03/30/2021 0722 Last data filed at 03/30/2021 0700 Gross per 24 hour  Intake 380.3 ml  Output 1500 ml  Net -1119.7 ml      Physical Exam   CVP 2-3  General:  Thin.  . No resp difficulty HEENT: Normal Neck: Supple. JVP flat . Carotids 2+ bilat; no bruits. No lymphadenopathy or thyromegaly appreciated. Cor: PMI nondisplaced. Tachy regular rate & rhythm. No rubs, gallops or  murmurs. Lungs: Clear Abdomen: Soft, nontender, nondistended. No hepatosplenomegaly. No bruits or masses. Good bowel sounds. Extremities: No cyanosis, clubbing, rash, edema Neuro: Alert & orientedx3, cranial nerves grossly intact. moves all 4 extremities w/o difficulty. Affect pleasant   Telemetry   Sinsu Tach 100s  EKG   N/A  Labs    CBC Recent Labs    03/29/21 0932 03/29/21 1843 03/29/21 1847 03/30/21 0508  WBC 13.3*  --   --  11.5*  NEUTROABS 10.4*  --   --   --   HGB 14.2   < > 14.3 14.2  HCT 41.4   < > 42.0 40.8  MCV 98.1  --   --  97.4  PLT 295  --   --  291   < > = values in this interval not displayed.   Basic Metabolic Panel Recent Labs    03/29/21 0932 03/29/21 1257 03/29/21 1843 03/29/21 1847 03/30/21 0508  NA 134*  --    < > 138 133*  K 2.9*  --    < > 3.8 3.2*  CL 96*  --   --   --  91*  CO2 24  --   --   --  28  GLUCOSE  145*  --   --   --  104*  BUN 9  --   --   --  8  CREATININE 0.71  --   --   --  0.82  CALCIUM 9.0  --   --   --  8.9  MG  --  2.0  --   --   --    < > = values in this interval not displayed.   Liver Function Tests No results for input(s): AST, ALT, ALKPHOS, BILITOT, PROT, ALBUMIN in the last 72 hours. No results for input(s): LIPASE, AMYLASE in the last 72 hours. Cardiac Enzymes No results for input(s): CKTOTAL, CKMB, CKMBINDEX, TROPONINI in the last 72 hours.  BNP: BNP (last 3 results) Recent Labs    03/16/21 1535 03/29/21 1257  BNP 648.7* 1,462.5*    ProBNP (last 3 results) No results for input(s): PROBNP in the last 8760 hours.   D-Dimer No results for input(s): DDIMER in the last 72 hours. Hemoglobin A1C No results for input(s): HGBA1C in the last 72 hours. Fasting Lipid Panel No results for input(s): CHOL, HDL, LDLCALC, TRIG, CHOLHDL, LDLDIRECT in the last 72 hours. Thyroid Function Tests No results for input(s): TSH, T4TOTAL, T3FREE, THYROIDAB in the last 72 hours.  Invalid input(s): FREET3  Other  results:   Imaging    DG Chest 2 View  Result Date: 03/29/2021 CLINICAL DATA:  Chest pain. EXAM: CHEST - 2 VIEW COMPARISON:  05/29/2020 FINDINGS: There is again pectus excavatum. Mild biapical pleural thickening is similar to prior. Chronic right greater than left apical pleural calcifications as better seen on prior CT. Cardiac silhouette is again mildly to moderately enlarged. Mediastinal contours are within normal limits with mild calcification within the aortic arch. New blunting of the posterior costophrenic angle on lateral view with mild basilar heterogeneous airspace opacity. No pneumothorax. Mild multilevel degenerative disc changes of the thoracic spine. IMPRESSION: Likely new tiny pleural effusions with mild basilar airspace opacity may represent atelectasis. Cannot exclude underlying pneumonia. Electronically Signed   By: Yvonne Kendall M.D.   On: 03/29/2021 09:46   CARDIAC CATHETERIZATION  Result Date: 03/29/2021   RPAV lesion is 100% stenosed.   Mid LAD lesion is 95% stenosed.   Ost Cx to Prox Cx lesion is 95% stenosed.   Dist Cx lesion is 60% stenosed.   Prox RCA lesion is 90% stenosed.   Mid RCA lesion is 100% stenosed.   Ost LAD lesion is 99% stenosed.   Mid Cx lesion is 90% stenosed.   Ost 1st Mrg lesion is 90% stenosed.   Ost 2nd Diag to 2nd Diag lesion is 80% stenosed.   The left ventricular ejection fraction is less than 25% by visual estimate. Findings: Ao =114/66 (86) LV = 118/25 RA =  2 RV = 48/5 PA = 48/24 (34) PCW = 23 Fick cardiac output/index = 2.4/1.6 PVR = 4.6 WU Ao sat = 97% PA sat = 54% Assessment: 1. 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 Ischemic CM with EF 20-25% with elevated filling pressures and low output 3. Lack of sufficient peripheral access to support IABP or Impella Plan/Discussion: Case reviewed with IC. Not CABG candidate due to size and severe lung disease. PCI would be very high risk and  require Impella support but has no access for that. Not sure we have any durable options for her. Will start IV NTG. Diurese gently. Will d/w Heart  Team. [name], MD 9:10 PM    Medications:     Scheduled Medications:  aspirin  81 mg Oral Daily   atropine       Chlorhexidine Gluconate Cloth  6 each Topical Daily   digoxin  0.0625 mg Oral Daily   furosemide  40 mg Intravenous BID   ivabradine  7.5 mg Oral BID WC   mouth rinse  15 mL Mouth Rinse BID   sodium chloride flush  10-40 mL Intracatheter Q12H   sodium chloride flush  3 mL Intravenous Q12H   sodium chloride flush  3 mL Intravenous Q12H    Infusions:  sodium chloride     heparin 550 Units/hr (03/30/21 0700)   nitroGLYCERIN 5 mcg/min (03/30/21 0700)    PRN Medications: sodium chloride, acetaminophen, acetaminophen, fentaNYL (SUBLIMAZE) injection, lip balm, midazolam, nitroGLYCERIN, ondansetron (ZOFRAN) IV, sodium chloride flush, sodium chloride flush, traZODone    Patient Profile   Robin Rose is a 66 y.o. female with h/o tobacco abuse, breast cancer (s/p chemo and left mastectomy), CAD, and systolic HF.   Presented to with chest pain./   Assessment/Plan   Chest Pain --> NSTEMI -HS Trop 354 -> pending  -s/p NSTEM 1/19/ LHC 03/2017  Severe 3v CAD unchanged from previous. - No obvious culprit lesion for NSTEMI - continue ASA/statin -On admit rushing chest pain this morning.  - 1/16 S/P LHC  . 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 Not a candidate for CABG due to size and severe lung disease. PCI would be very high risk and require Impella support but has no access for that.  - Dr Haroldine Laws discussing with CT surgery to explore all options.  - Continue NTG.  2. A/C HFrEF , ICM  -Failed milrinone attempt in 03/2017 due to worsening tachycardia - Not candidate for advanced therapies and has failed inotropes. We offered her an  evaluation at [facility] for 2nd opinion but she has declined. -Echo 2022 EF 25-30%.  - Check CO-OX now.  - Volume status improved. CVP 2. Give one more dose of IV lasix but will cut to 20 mg x1. Supp K   - Continue digoxin. Dig level 0.3 - Continue ivabradine 7.5 mg twice a day    3. Hypokalemia -K 3.2.  - Supp K.     4. Breast Cancer, left Triple negative. S/p Adjuvant chemotherapy followed by left mastectomy and LN dissection 2/18 and XRT.    5. Tobacco Abuse - not interested in quitting  6. DNR/DNI   Length of Stay: 1  Amy Clegg, NP  03/30/2021, 7:23 AM  Advanced Heart Failure Team Pager [phone] (M-F; 7a - 5p)  Please contact [facility] for night-coverage after hours (5p -7a ) and weekends on [email or web address]  Patient seen and examined with the above-signed Advanced Practice Provider and/or Housestaff. I personally reviewed laboratory data, imaging studies and relevant notes. I independently examined the patient and formulated the important aspects of the plan. I have edited the note to reflect any of my changes or salient points. I have personally discussed the plan with the patient and/or family.  On IV NTG and heparin. Still with intermittent CP. Rhythm stable. Diuresing well.   General:  Thin weak appearing. No resp difficulty HEENT: normal Neck: supple. no JVD. RIJ central Carotids 2+ bilat; no bruits. No lymphadenopathy or thryomegaly appreciated. Cor: PMI nondisplaced. Regular rate & rhythm. No rubs, gallops or murmurs. Lungs: clear Abdomen:  soft, nontender, nondistended. No hepatosplenomegaly. No bruits or masses. Good bowel sounds. Extremities: no cyanosis, clubbing, rash, edema Neuro: alert & orientedx3, cranial nerves grossly intact. moves all 4 extremities w/o difficulty. Affect pleasant  I have reviewed options with interventional team and the only option seems to be very high risk bifurcation LAD/LCX stenting under impella support but would need Impella support. Her  femoral anatomy will not support Impella placement so would need to consider axillary placement that may not be large enough as well. Will d/w TCTS.   CRITICAL CARE Performed by: [name]  Total critical care time: 35 minutes  Critical care time was exclusive of separately billable procedures and treating other patients.  Critical care was necessary to treat or prevent imminent or life-threatening deterioration.  Critical care was time spent personally by me (independent of midlevel providers or residents) on the following activities: development of treatment plan with patient and/or surrogate as well as nursing, discussions with consultants, evaluation of patient's response to treatment, examination of patient, obtaining history from patient or surrogate, ordering and performing treatments and interventions, ordering and review of laboratory studies, ordering and review of radiographic studies, pulse oximetry and re-evaluation of patient's condition.  [name], MD  8:13 AM   Addendum:  I have discussed options with patients regarding attempt at high-risk PCI with surgical Impella support vs comfort care.   She would like to go home with Hospice.   Palliative Care consult placed.   [name], MD  9:45 AM

## 2021-03-30 NOTE — Plan of Care (Signed)
°  Problem: Activity: Goal: Ability to return to baseline activity level will improve Outcome: Progressing   Problem: Clinical Measurements: Goal: Respiratory complications will improve Outcome: Progressing   Problem: Activity: Goal: Risk for activity intolerance will decrease Outcome: Progressing   Problem: Coping: Goal: Level of anxiety will decrease Outcome: Progressing   Problem: Pain Managment: Goal: General experience of comfort will improve Outcome: Progressing   Problem: Safety: Goal: Ability to remain free from injury will improve Outcome: Progressing

## 2021-03-30 NOTE — Progress Notes (Signed)
°  Transition of Care [facility]) Screening Note   Patient Details  Name: Robin Rose Date of Birth: 1955-04-14   [facility]) CM/SW Contact:    Faith Branan, LCSW Phone Number: 03/30/2021, 4:57 PM    Transition of Care Department [facility]) has reviewed patient and no TOC needs have been identified at this time. We will continue to monitor patient advancement through interdisciplinary progression rounds. Patient will benefit from PT/OT consult for disposition recommendations.  If new patient transition needs arise, please place a TOC consult.

## 2021-03-30 NOTE — Progress Notes (Signed)
ANTICOAGULATION CONSULT NOTE  Pharmacy Consult for heparin Indication: chest pain/ACS  Allergies  Allergen Reactions   Chantix [Varenicline] Nausea And Vomiting   Codeine Other (See Comments)    GI Upset, headaches      Patient Measurements: Height: 5\' 3"  (160 cm) Weight: 47.1 kg (103 lb 13.4 oz) IBW/kg (Calculated) : 52.4 Heparin Dosing Weight: 48.5 kg   Vital Signs: BP: 92/49 (01/17 1300) Pulse Rate: 104 (01/17 1400)  Labs: Recent Labs    03/29/21 0932 03/29/21 1257 03/29/21 1843 03/29/21 1847 03/30/21 0508 03/30/21 1232  HGB 14.2  --  12.2 14.3 14.2  --   HCT 41.4  --  36.0 42.0 40.8  --   PLT 295  --   --   --  291  --   HEPARINUNFRC  --   --   --   --   --  <0.10*  CREATININE 0.71  --   --   --  0.82  --   TROPONINIHS 354* 777*  --   --   --   --      Estimated Creatinine Clearance: 51.5 mL/min (by C-G formula based on SCr of 0.82 mg/dL).   Medical History: Past Medical History:  Diagnosis Date   Breast cancer, left ([place]) 2018   CHF (congestive heart failure) ([place])    Coronary artery disease    Family history of adverse reaction to anesthesia    "siblings, parents get PONV too" (03/15/2017)   NSTEMI (non-ST elevated myocardial infarction) ([place]) 03/14/2017   PONV (postoperative nausea and vomiting)     Medications:  Scheduled:   aspirin  81 mg Oral Daily   atorvastatin  80 mg Oral Daily   Chlorhexidine Gluconate Cloth  6 each Topical Daily   digoxin  0.0625 mg Oral Daily   ivabradine  7.5 mg Oral BID WC   mouth rinse  15 mL Mouth Rinse BID   sodium chloride flush  10-40 mL Intracatheter Q12H   sodium chloride flush  3 mL Intravenous Q12H   sodium chloride flush  3 mL Intravenous Q12H    Assessment: 33 yof who presented with CP this morning - no AC PTA.  Pt s/p LHC with mvCAD. Heparin level came back undetectable, on 550 units/hr. Hgb 14.2, plt 291. No s/sx of bleeding or infusion issues.   Goal of Therapy:  Heparin level 0.3-0.7  units/ml Monitor platelets by anticoagulation protocol: Yes   Plan:  Increase heparin 700 units/h  Order 6 hr HL  Monitor daily HL, CBC, and for s/sx of bleeding   Antonietta Jewel, PharmD, [place] Pharmacist  Phone: [phone] 03/30/2021 3:04 PM  Please check AMION for all [place] phone numbers After 10:00 PM, call [place] [phone]

## 2021-03-30 NOTE — Consult Note (Addendum)
Consultation Note Date: 03/30/2021   Patient Name: Robin Rose  DOB: 30-Nov-1955  MRN: [id number]  Age / Sex: 66 y.o., female  PCP: [name], MD Referring Physician: Jolaine Artist, MD  Reason for Consultation: Establishing goals of care and Hospice Evaluation  HPI/Patient Profile: 66 y.o. female  with past medical history of systolic heart failure (EF 20-25% 2018), tobacco abuse, breast cancer (s/p chemo and L mastectomy) admitted on 03/29/2021 with chest pain and dyspnea with NSTEMI. Severe CAD with ongoing chest pain and no good options for intervention. She is interested in hospice options. She has previously failed milrinone. Ongoing diuresis to optimize.   Clinical Assessment and Goals of Care: I met today with [name] along with her sister and son at bedside. They have good understanding of her cardiac condition and poor prognosis. [name] admits to being very overwhelmed. We discussed with her that our main concern is ensuring her comfort and optimizing her time to spend it how and where is most important to her. We discussed plan to move forward with potential transition to full comfort in the hospital and with goal to potentially return home with hospice vs hospice facility. She lives in [place] and had a very good experience at the hospice facility there with her mother. We discussed her ongoing chest pain and the reality of managing her symptoms and if she would enjoy the benefits of being in her home vs the burden of trying to ensure her comfort and getting good care in her home. We discussed that we can arrange care to support her wherever she wishes to be but I do feel she will need someone with her 24/7 at home. Her son talks of an experience with an in-law having hospice at home and fear that this person was not as comfortable and peaceful as they could have been if  they had been in a hospice facility setting and how this took away from having a good end of life experience for the patient and family. [name] requests time to consider her options.   We spoke more with [name] who talks lovingly as her family which is clearly her joy in life. She has 2 sons and grandchildren. She is very close with her sister and her niece as well. She describes that she has not really had good quality of life for 5 years now and unable to really do anything she enjoys. She is not interested in pursuing risky procedures as she has been through a lot in her life already. She is saddened and not ready to leave her family. She is grieving appropriately. She desires comfort but needs some time to consider her options and goals moving forward.   All questions/concerns addressed. Emotional support provided.   Primary Decision Maker PATIENT    SUMMARY OF RECOMMENDATIONS   Considering transition to full comfort and hospice options  Code Status/Advance Care Planning: DNR   Symptom Management:  PRN medications added to ensure comfort in event of  further decline.   Palliative Prophylaxis:  Aspiration, Frequent Pain Assessment, and Turn Reposition  Psycho-social/Spiritual:  Desire for further Chaplaincy support:no - offered and educated on how to access Additional Recommendations: Education on Hospice and Grief/Bereavement Support  Prognosis:  Prognosis very poor with severe CAD and ongoing chest pain. Given ongoing chest pain and need for symptom management she would be a good candidate for hospice facility if she desires.   Discharge Planning: To Be Determined      Primary Diagnoses: Present on Admission:  Acute on chronic systolic (congestive) heart failure (HCC)  NSTEMI (non-ST elevated myocardial infarction) ([place])   I have reviewed the medical record, interviewed the patient and family, and examined the patient. The following aspects are  pertinent.  Past Medical History:  Diagnosis Date   Breast cancer, left ([place]) 2018   CHF (congestive heart failure) (H[facility])    Coronary artery disease    Family history of adverse reaction to anesthesia    "siblings, parents get PONV too" (03/15/2017)   NSTEMI (non-ST elevated myocardial infarction) ([place]) 03/14/2017   PONV (postoperative nausea and vomiting)    Social History   Socioeconomic History   Marital status: Divorced    Spouse name: Not on file   Number of children: Not on file   Years of education: Not on file   Highest education level: Not on file  Occupational History   Not on file  Tobacco Use   Smoking status: Light Smoker    Years: 38.00    Types: Cigarettes   Smokeless tobacco: Never  Vaping Use   Vaping Use: Never used  Substance and Sexual Activity   Alcohol use: No   Drug use: No   Sexual activity: Not Currently  Other Topics Concern   Not on file  Social History Narrative   Not on file   Social Determinants of Health   Financial Resource Strain: Not on file  Food Insecurity: Not on file  Transportation Needs: Not on file  Physical Activity: Not on file  Stress: Not on file  Social Connections: Not on file   History reviewed. No pertinent family history. Scheduled Meds:  aspirin  81 mg Oral Daily   atorvastatin  80 mg Oral Daily   Chlorhexidine Gluconate Cloth  6 each Topical Daily   digoxin  0.0625 mg Oral Daily   ivabradine  7.5 mg Oral BID WC   mouth rinse  15 mL Mouth Rinse BID   sodium chloride flush  10-40 mL Intracatheter Q12H   sodium chloride flush  3 mL Intravenous Q12H   sodium chloride flush  3 mL Intravenous Q12H   Continuous Infusions:  sodium chloride Stopped (03/30/21 0833)   heparin 550 Units/hr (03/30/21 1400)   nitroGLYCERIN Stopped (03/30/21 1106)   PRN Meds:.sodium chloride, acetaminophen, acetaminophen, fentaNYL (SUBLIMAZE) injection, lip balm, midazolam, nitroGLYCERIN, ondansetron (ZOFRAN) IV, sodium chloride flush,  sodium chloride flush, traZODone Allergies  Allergen Reactions   Chantix [Varenicline] Nausea And Vomiting   Codeine Other (See Comments)    GI Upset, headaches     Review of Systems  Constitutional:  Positive for activity change, appetite change and fatigue.  Respiratory:         No chest pain currently during my visit  Neurological:  Positive for weakness.   Physical Exam Vitals and nursing note reviewed.  Cardiovascular:     Rate and Rhythm: Tachycardia present.     Comments: Heparin infusion going; off nitro infusion Abdominal:     General:  Abdomen is flat.  Neurological:     Mental Status: She is alert and oriented to person, place, and time.    Vital Signs: BP (!) 92/49    Pulse (!) 104    Temp 98.1 F (36.7 C) (Oral)    Resp (!) 30    Ht 5' 3"  (1.6 m)    Wt 47.1 kg    SpO2 91%    BMI 18.39 kg/m  Pain Scale: 0-10 POSS *See Group Information*: 1-Acceptable,Awake and alert Pain Score: 2    SpO2: SpO2: 91 % O2 Device:SpO2: 91 % O2 Flow Rate: .O2 Flow Rate (L/min): 2 L/min  IO: Intake/output summary:  Intake/Output Summary (Last 24 hours) at 03/30/2021 1501 Last data filed at 03/30/2021 1400 Gross per 24 hour  Intake 601.12 ml  Output 1802 ml  Net -1200.88 ml    LBM: Last BM Date:  (PTA) Baseline Weight: Weight: 48.5 kg Most recent weight: Weight: 47.1 kg     Palliative Assessment/Data:     Time In: 1400 Time Out: 1515 Time Total: 75 min Greater than 50%  of this time was spent counseling and coordinating care related to the above assessment and plan.  Signed by: Vinie Sill, NP Palliative Medicine Team Pager # [phone] (M-F 8a-5p) Team Phone # [phone] (Nights/Weekends)

## 2021-03-30 NOTE — Plan of Care (Signed)
°  Problem: Education: Goal: Understanding of CV disease, CV risk reduction, and recovery process will improve Outcome: Progressing   Problem: Activity: Goal: Ability to return to baseline activity level will improve Outcome: Progressing   Problem: Cardiovascular: Goal: Ability to achieve and maintain adequate cardiovascular perfusion will improve Outcome: Progressing Goal: Vascular access site(s) Level 0-1 will be maintained Outcome: Progressing   Problem: Health Behavior/Discharge Planning: Goal: Ability to safely manage health-related needs after discharge will improve Outcome: Progressing   Problem: Education: Goal: Knowledge of General Education information will improve Description: Including pain rating scale, medication(s)/side effects and non-pharmacologic comfort measures Outcome: Progressing   Problem: Health Behavior/Discharge Planning: Goal: Ability to manage health-related needs will improve Outcome: Progressing   Problem: Clinical Measurements: Goal: Ability to maintain clinical measurements within normal limits will improve Outcome: Progressing Goal: Will remain free from infection Outcome: Progressing Goal: Diagnostic test results will improve Outcome: Progressing Goal: Respiratory complications will improve Outcome: Progressing Goal: Cardiovascular complication will be avoided Outcome: Progressing   Problem: Activity: Goal: Risk for activity intolerance will decrease Outcome: Progressing   Problem: Coping: Goal: Level of anxiety will decrease Outcome: Progressing   Problem: Elimination: Goal: Will not experience complications related to urinary retention Outcome: Progressing   Problem: Pain Managment: Goal: General experience of comfort will improve Outcome: Progressing   Problem: Safety: Goal: Ability to remain free from injury will improve Outcome: Progressing   Problem: Skin Integrity: Goal: Risk for impaired skin integrity will  decrease Outcome: Progressing   Problem: Cardiac: Goal: Ability to achieve and maintain adequate cardiopulmonary perfusion will improve Outcome: Progressing

## 2021-03-30 NOTE — Progress Notes (Signed)
Left femoral arterial sheath removed at 2305 03/29/2021. Pressure held for 30 minutes manually. Pt tolerated process well. Hemodynamics stable. Site is level 1 with no hematoma, only mild bruising at insertion site. Dressed site with transparent dressing. Pt educated on post sheath removal orders as well as signs/symptoms of concern. Primary RN Candyce Churn at bedside throughout procedure and aware of removal events.

## 2021-03-31 DIAGNOSIS — I5023 Acute on chronic systolic (congestive) heart failure: Secondary | ICD-10-CM | POA: Diagnosis not present

## 2021-03-31 LAB — CBC
HCT: 37.1 % (ref 36.0–46.0)
Hemoglobin: 13 g/dL (ref 12.0–15.0)
MCH: 34.2 pg — ABNORMAL HIGH (ref 26.0–34.0)
MCHC: 35 g/dL (ref 30.0–36.0)
MCV: 97.6 fL (ref 80.0–100.0)
Platelets: 246 10*3/uL (ref 150–400)
RBC: 3.8 MIL/uL — ABNORMAL LOW (ref 3.87–5.11)
RDW: 12.1 % (ref 11.5–15.5)
WBC: 10.3 10*3/uL (ref 4.0–10.5)
nRBC: 0 % (ref 0.0–0.2)

## 2021-03-31 LAB — BASIC METABOLIC PANEL
Anion gap: 8 (ref 5–15)
BUN: 12 mg/dL (ref 8–23)
CO2: 29 mmol/L (ref 22–32)
Calcium: 8.8 mg/dL — ABNORMAL LOW (ref 8.9–10.3)
Chloride: 96 mmol/L — ABNORMAL LOW (ref 98–111)
Creatinine, Ser: 0.74 mg/dL (ref 0.44–1.00)
GFR, Estimated: >60 mL/min (ref >60)
Glucose, Bld: 126 mg/dL — ABNORMAL HIGH (ref 70–99)
Potassium: 3.9 mmol/L (ref 3.5–5.1)
Sodium: 133 mmol/L — ABNORMAL LOW (ref 135–145)

## 2021-03-31 LAB — MAGNESIUM: Magnesium: 2.1 mg/dL (ref 1.7–2.4)

## 2021-03-31 MED ORDER — MORPHINE SULFATE (PF) 2 MG/ML IV SOLN: 2.0000 mg | INTRAVENOUS | Status: DC | PRN

## 2021-03-31 MED ORDER — ISOSORBIDE MONONITRATE ER 30 MG PO TB24
30.0000 mg | ORAL_TABLET | Freq: Every day | ORAL | Status: DC
  Administered 2021-03-31 – 2021-04-01 (×2): 30 mg via ORAL
  Filled 2021-03-31 (×3): qty 1

## 2021-03-31 MED ORDER — DIPHENHYDRAMINE HCL 25 MG PO CAPS
25.0000 mg | ORAL_CAPSULE | Freq: Three times a day (TID) | ORAL | Status: DC | PRN
  Administered 2021-03-31: 25 mg via ORAL
  Filled 2021-03-31: qty 1

## 2021-03-31 MED ORDER — MORPHINE SULFATE (CONCENTRATE) 10 MG/0.5ML PO SOLN: 5.0000 mg | ORAL | Status: DC | PRN

## 2021-03-31 MED ORDER — NITROGLYCERIN IN D5W 200-5 MCG/ML-% IV SOLN
0.0000 ug/min | INTRAVENOUS | Status: DC
  Administered 2021-03-31: 5 ug/min via INTRAVENOUS
  Filled 2021-03-31: qty 250

## 2021-03-31 MED ORDER — MORPHINE SULFATE (CONCENTRATE) 10 MG/0.5ML PO SOLN
5.0000 mg | Freq: Three times a day (TID) | ORAL | Status: DC
  Administered 2021-03-31 (×2): 5 mg via ORAL
  Filled 2021-03-31 (×4): qty 0.5

## 2021-03-31 NOTE — TOC Progression Note (Addendum)
Transition of Care [facility]) - Progression Note    Patient Details  Name: Nettie Wyffels MRN: [id number] Date of Birth: 10/29/1955  Transition [facility]) CM/SW Contact  Cheick Suhr, LCSW Phone Number: 03/31/2021, 3:09 PM  Clinical Narrative:    HF CSW received a message from NP, Jerline Pain about Ms. Yano needing a  Hospice facility in [place]. CSW reached out to [facility] in [place] for a referral for inpatient Hospice facility. [facility] to notify CSW about availability.   CSW will continue to follow throughout discharge.        Expected Discharge Plan and Services                                                 Social Determinants of Health (SDOH) Interventions    Readmission Risk Interventions No flowsheet data found.

## 2021-03-31 NOTE — Progress Notes (Signed)
Nitro drip was stopped at 1225. Patient blood pressure is 94/64 so no morphine was administered and no IMDUR was given. Denied pain and refused both medications at 1229.  Pt  and her sister requested to speak with palliative nurse Vinie Sill NP and Elmo Putt said she would return to the room to speak with them both within an hour at least.

## 2021-03-31 NOTE — Progress Notes (Addendum)
Advanced Heart Failure Rounding Note  PCP-Cardiologist: Quay Burow, MD   Subjective:   1/16 S/P LHC - critical 3V disease   RPAV lesion is 100% stenosed.   Mid LAD lesion is 95% stenosed.   Ost Cx to Prox Cx lesion is 95% stenosed.   Dist Cx lesion is 60% stenosed.   Prox RCA lesion is 90% stenosed.   Mid RCA lesion is 100% stenosed.   Ost LAD lesion is 99% stenosed.   Mid Cx lesion is 90% stenosed.   Ost 1st Mrg lesion is 90% stenosed.   Ost 2nd Diag to 2nd Diag lesion is 80% stenosed.   The left ventricular ejection fraction is less than 25% by visual estimate.  Findings:  Ao =114/66 (86) LV = 118/25 RA =  2 RV = 48/5 PA = 48/24 (34) PCW = 23 Fick cardiac output/index = 2.4/1.6 PVR = 4.6 WU Ao sat = 97% PA sat = 54%  1/17 elected full hospice.   Had chest pain this morning and NTG drip was restarted.   Currently no chest pain.  Objective:   Weight Range: 47.4 kg Body mass index is 18.53 kg/m.   Vital Signs:   Temp:  [97.5 F (36.4 C)-99 F (37.2 C)] 97.5 F (36.4 C) (01/18 0722) Pulse Rate:  [85-104] 85 (01/18 0722) Resp:  [15-31] 23 (01/18 0722) BP: (71-132)/(49-83) 118/66 (01/18 0722) SpO2:  [83 %-99 %] 98 % (01/18 0722) Weight:  [47.3 kg-47.4 kg] 47.4 kg (01/18 0017) Last BM Date:  (PTA)  Weight change: Filed Weights   03/30/21 0500 03/30/21 1832 03/31/21 0017  Weight: 47.1 kg 47.3 kg 47.4 kg    Intake/Output:   Intake/Output Summary (Last 24 hours) at 03/31/2021 1010 Last data filed at 03/31/2021 0820 Gross per 24 hour  Intake 551.63 ml  Output 850 ml  Net -298.37 ml      Physical Exam  General:  No resp difficulty HEENT: normal Neck: supple. no JVD. Carotids 2+ bilat; no bruits. No lymphadenopathy or thryomegaly appreciated. Cor: PMI nondisplaced. Tach Regular rate & rhythm. No rubs, gallops or murmurs. Lungs: clear Abdomen: soft, nontender, nondistended. No hepatosplenomegaly. No bruits or masses. Good bowel  sounds. Extremities: no cyanosis, clubbing, rash, edema Neuro: alert & orientedx3, cranial nerves grossly intact. moves all 4 extremities w/o difficulty. Affect pleasant   Telemetry   SR-ST 90-100s EKG   N/A  Labs    CBC Recent Labs    03/29/21 0932 03/29/21 1843 03/30/21 0508 03/31/21 0359  WBC 13.3*  --  11.5* 10.3  NEUTROABS 10.4*  --   --   --   HGB 14.2   < > 14.2 13.0  HCT 41.4   < > 40.8 37.1  MCV 98.1  --  97.4 97.6  PLT 295  --  291 246   < > = values in this interval not displayed.   Basic Metabolic Panel Recent Labs    03/30/21 0508 03/31/21 0359  NA 133* 133*  K 3.2* 3.9  CL 91* 96*  CO2 28 29  GLUCOSE 104* 126*  BUN 8 12  CREATININE 0.82 0.74  CALCIUM 8.9 8.8*  MG 1.8 2.1   Liver Function Tests No results for input(s): AST, ALT, ALKPHOS, BILITOT, PROT, ALBUMIN in the last 72 hours. No results for input(s): LIPASE, AMYLASE in the last 72 hours. Cardiac Enzymes No results for input(s): CKTOTAL, CKMB, CKMBINDEX, TROPONINI in the last 72 hours.  BNP: BNP (last 3 results) Recent Labs  03/16/21 1535 03/29/21 1257  BNP 648.7* 1,462.5*    ProBNP (last 3 results) No results for input(s): PROBNP in the last 8760 hours.   D-Dimer No results for input(s): DDIMER in the last 72 hours. Hemoglobin A1C No results for input(s): HGBA1C in the last 72 hours. Fasting Lipid Panel Recent Labs    03/30/21 0826  CHOL 150  HDL 42  LDLCALC 84  TRIG 118  CHOLHDL 3.6   Thyroid Function Tests No results for input(s): TSH, T4TOTAL, T3FREE, THYROIDAB in the last 72 hours.  Invalid input(s): FREET3  Other results:   Imaging    No results found.   Medications:     Scheduled Medications:  aspirin  81 mg Oral Daily   atorvastatin  80 mg Oral Daily   Chlorhexidine Gluconate Cloth  6 each Topical Daily   digoxin  0.0625 mg Oral Daily   ivabradine  7.5 mg Oral BID WC   mouth rinse  15 mL Mouth Rinse BID   sodium chloride flush  10-40 mL  Intracatheter Q12H   sodium chloride flush  3 mL Intravenous Q12H   sodium chloride flush  3 mL Intravenous Q12H    Infusions:  sodium chloride 250 mL (03/30/21 1651)   nitroGLYCERIN 10 mcg/min (03/31/21 0633)    PRN Medications: sodium chloride, acetaminophen, acetaminophen, fentaNYL (SUBLIMAZE) injection, guaiFENesin-dextromethorphan, lip balm, LORazepam, morphine injection, nitroGLYCERIN, ondansetron (ZOFRAN) IV, sodium chloride flush, sodium chloride flush, traZODone    Patient Profile   Robin Rose is a 66 y.o. female with h/o tobacco abuse, breast cancer (s/p chemo and left mastectomy), CAD, and systolic HF.   Presented to with chest pain./   Assessment/Plan   Chest Pain --> NSTEMI -HS Trop 354 -> pending  -s/p NSTEM 1/19/ LHC 03/2017  Severe 3v CAD unchanged from previous. - No obvious culprit lesion for NSTEMI - continue ASA/statin -On admit rushing chest pain this morning.  - 1/16 S/P LHC  . 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 Not a candidate for CABG due to size and severe lung disease. PCI would be very high risk and require Impella support but has no access for that.  - Dr Haroldine Laws discussing with CT surgery . No options for intervention and has elected Hospice.  - Stop NTG drip and place on Imdur + Morphine for pain.   2. A/C HFrEF , ICM  -Failed milrinone attempt in 03/2017 due to worsening tachycardia - Not candidate for advanced therapies and has failed inotropes. We offered her an evaluation at [facility] for 2nd opinion but she has declined. -Echo 2022 EF 25-30%.  - Volume status stable.   - Continue digoxin. Dig level 0.3 - Continue ivabradine 7.5 mg twice a day .    3. Hypokalemia -K 3.2.  - Supp K.     4. Breast Cancer, left Triple negative. S/p Adjuvant chemotherapy followed by left mastectomy and LN dissection 2/18 and XRT.    5. Tobacco Abuse - not interested in  quitting  6. DNR/DNI -Palliative Care appreciated. She has elected Hospice.  -Trying to coordinate home versus residential.   Stop nonessential meds. Adding morphine.     Length of Stay: 2  Darrick Grinder, NP  03/31/2021, 10:10 AM  Advanced Heart Failure Team Pager [phone] (M-F; 7a - 5p)  Please contact [facility] for night-coverage after hours (5p -7a ) and weekends on [email or web address]  Patient seen and examined with the above-signed  Advanced Practice Provider and/or Housestaff. I personally reviewed laboratory data, imaging studies and relevant notes. I independently examined the patient and formulated the important aspects of the plan. I have edited the note to reflect any of my changes or salient points. I have personally discussed the plan with the patient and/or family.  Continues to have CP. NTG restarted. CP improved.   Very weak. Wants to go home with Hospice then possibly to a [place]:  Thin weak appearing. No resp difficulty HEENT: normal Neck: supple. no JVD. Carotids 2+ bilat; no bruits. No lymphadenopathy or thryomegaly appreciated. Cor: Regular rate & rhythm. No rubs, gallops or murmurs. Lungs: clear Abdomen: soft, nontender, nondistended. No hepatosplenomegaly. No bruits or masses. Good bowel sounds. Extremities: no cyanosis, clubbing, rash, edema Neuro: alert & orientedx3, cranial nerves grossly intact. moves all 4 extremities w/o difficulty. Affect pleasant  She continues to have CP. Very clear she wants Hospice care. Will need to focus on pain control. Have d/w Palliative Care team. Stop NTG gtt. Use imdur/morphine.   [name], MD  11:15 AM

## 2021-03-31 NOTE — Discharge Summary (Addendum)
Advanced Heart Failure Team  Discharge Summary   Patient ID: Robin Rose MRN: [id number], DOB/AGE: 05-15-1955 66 y.o. Admit date: 03/29/2021 D/C date:     04/01/2021   Primary Discharge Diagnoses:  Chest Pain-->NSTEMI/ CAD A/C HFrEF, ICM Hypokalemia  Breast Cancer, left Tobacco Abuse  DNR/DNI  Hospital Course:   Robin Rose is a 66 y.o. female with h/o tobacco abuse, breast cancer (s/p chemo and left mastectomy) and systolic HF.     In 8/17 she was diagnosed with triple-negative breast cancer with lymph node involvement. She underwent neoadjuvant chemotherapy including adriamycin (6 cycles)  followed by left mastectomy in 2/18 and XRT (completed 4/18).    Her EF 11/05/15 was 55% and by MUGA 04/06/16 was 69%. She was admitted to [facility] 5/18  with acute HF. EF was 20-25% by echo on 08/03/16.  Cath showed severe 3vCAD - not candidate for CABG due to diffuse CAD and size.  Failed milrinone attempt in 1/19 due to worsening tachycardia. Has been managed medically.    Not candidate for advanced therapies and has failed inotropes. We offered her an evaluation at [facility] for 2nd opinion but she has declined.   Saw Dr Haroldine Laws 03/16/21 due to increased dyspnea. Placed on lasix 20 mg M/F. Over the last 7 days she has been feeling poorly. Orthopnea. Unable o sleep in the bed. Had to sleep on the couch. She did not take lasix on Friday but took lasix Sat/Sun.   Presented with chest pain 10/10 and unstable angina/NSTEMI. Elevated troponin. Placed on heparin drip a. HS Trop 354->774. Underwent LHC/RHC with critical 3V CAD with 99% ostial LAD (calcified) and 95% proximal LCX (calcified) and totally occluded RCA. Case reviewed with IC. Not CABG candidate due to size and severe lung disease. PCI would be very high risk and require Impella support but has no access for that. Discussed with Heart Team, unfortunately no options. She elected to transition to DNR/DNI.    Palliative Care consulted and she elected residential Hospice. TOC consulted for Hospice referral. All nonessential medications stopped. Will need to focus on pain control. Use imdur/morphine for pain. IV Lasix PRN for comfort.   On 1/19, Pt transferred to [facility].   Discharge Vitals: Blood pressure 123/76, pulse (!) 125, temperature 98 F (36.7 C), temperature source Oral, resp. rate (!) 22, height 5\' 3"  (1.6 m), weight 47.6 kg, SpO2 98 %.  Labs: Lab Results  Component Value Date   WBC 11.6 (H) 04/01/2021   HGB 12.5 04/01/2021   HCT 36.7 04/01/2021   MCV 98.9 04/01/2021   PLT 265 04/01/2021    Recent Labs  Lab 04/01/21 0347  NA 133*  K 3.9  CL 96*  CO2 27  BUN 10  CREATININE 0.73  CALCIUM 8.8*  GLUCOSE 128*   Lab Results  Component Value Date   CHOL 150 03/30/2021   HDL 42 03/30/2021   LDLCALC 84 03/30/2021   TRIG 118 03/30/2021   BNP (last 3 results) Recent Labs    03/16/21 1535 03/29/21 1257  BNP 648.7* 1,462.5*    ProBNP (last 3 results) No results for input(s): PROBNP in the last 8760 hours.   Diagnostic Studies/Procedures   No results found.  Discharge Medications   Allergies as of 04/01/2021       Reactions   Chantix [varenicline] Nausea And Vomiting   Codeine Other (See Comments)   GI Upset, headaches         Medication List  STOP taking these medications    acetaminophen 500 MG tablet Commonly known as: TYLENOL   aspirin 81 MG chewable tablet   diphenhydrAMINE 25 mg capsule Commonly known as: BENADRYL   furosemide 20 MG tablet Commonly known as: LASIX   ivabradine 7.5 MG Tabs tablet Commonly known as: Corlanor   oxymetazoline 0.05 % nasal spray Commonly known as: AFRIN   potassium chloride 10 MEQ tablet Commonly known as: KLOR-CON M   traZODone 50 MG tablet Commonly known as: DESYREL       TAKE these medications    Digoxin 62.5 MCG Tabs Take 0.0625 mg by mouth daily. What changed:   medication strength See the new instructions.   isosorbide mononitrate 30 MG 24 hr tablet Commonly known as: IMDUR Take 1 tablet (30 mg total) by mouth daily.   morphine CONCENTRATE 10 MG/0.5ML Soln concentrated solution Take 0.25 mLs (5 mg total) by mouth 3 (three) times daily.   morphine CONCENTRATE 10 MG/0.5ML Soln concentrated solution Place 0.25-0.5 mLs (5-10 mg total) under the tongue every hour as needed for severe pain or shortness of breath (chest pain).   morphine 2 MG/ML injection Inject 1 mL (2 mg total) into the vein every hour as needed (sob, chest pain).   nitroGLYCERIN 0.4 MG SL tablet Commonly known as: NITROSTAT Place 1 tablet (0.4 mg total) under the tongue every 5 (five) minutes as needed for chest pain.   ondansetron 4 MG tablet Commonly known as: ZOFRAN Take 1 tablet (4 mg total) by mouth every 4 (four) hours as needed.        Disposition   The patient will be discharged in stable condition to hospice.       Duration of Discharge Encounter: Greater than 35 minutes   Signed, Nelida Gores  04/01/2021, 8:58 AM  Patient seen and examined with the above-signed Advanced Practice Provider and/or Housestaff. I personally reviewed laboratory data, imaging studies and relevant notes. I independently examined the patient and formulated the important aspects of the plan. I have edited the note to reflect any of my changes or salient points. I have personally discussed the plan with the patient and/or family.  She remains steadfast that she does not want any more procedures. Continues with episodes of CP and SOB. Plan is d/c to Hospice facility for better management of symptoms as she nears the end of her life.   [name], MD  10:51 PM

## 2021-03-31 NOTE — Plan of Care (Signed)
  Problem: Pain Managment: Goal: General experience of comfort will improve Outcome: Progressing   

## 2021-03-31 NOTE — Progress Notes (Signed)
Palliative: ° °HPI: 65 y.o. female  with past medical history of systolic heart failure (EF 20-25% 2018), tobacco abuse, breast cancer (s/p chemo and L mastectomy) admitted on 03/29/2021 with chest pain and dyspnea with NSTEMI. Severe CAD with ongoing chest pain and no good options for intervention. She is interested in hospice options. She has previously failed milrinone. Ongoing diuresis to optimize.   ° °I met today with Ms. Broker along with her sister and son and joined by Amy Clegg, NP for part of my visit. Ms. Woodle has had recurrent chest pain overnight receiving SL nitro and she did receive morphine as well. She does report she had relief with morphine but she was ultimately placed back on nintro infusion early this morning. Discussed best way to manage ongoing chest pain and Amy to begin Imdur and I will add scheduled and prn morphine to PREVENT chest pain and try and stay ahead of her symptoms. They all agree with this plan.  ° °We spent time discussing in detail where to go from here. Ms. Royer expresses desire to return to her home as she feels that she has unfinished business in her home. We discussed further hospice at home and even going home briefly with plan to transition to hospice facility at a later date if this is her wish. Family says that they cannot be with her at home and they worry about her symptoms and level of suffering if she were to return home. We discussed all options in detail and they request more time to speak privately together.  ° °UPDATE: I was called by sister to return to room. They have had a chance to talk and Ms. Ayo shares that she would like to proceed with going to hospice facility in [place]. They ask about eligibility and I explain that ultimately it is up to hospice but given Ms. Bayus' ongoing chest pain and symptom burden I do feel that she should be eligible for hospice facility. I explained that if she were not having ongoing chest pain that she  would likely not be eligible for hospice facility but more so for hospice at home. I will reach out to TOC team for official referral to [facility] for hospice facility placement.  ° °All questions/concerns addressed. Emotional support provided.  ° °Exam: Alert, oriented. No distress. Chest pain stable since nitroglycerin infusion began - transitioning off infusion now. Breathing regular, unlabored. Abd flat. Moves all extremities.  ° °Plan: °- DNR °- Comfort care. °- Chest pain: Heart failure began Imdur. Morphine 5 mg TID with 5-10 mg SL every hour prn. Please do not hold doses.  °- Transition to [facility] hospice facility.  ° °55 min ° °Alicia Parker, NP °Palliative Medicine Team °Pager [phone] (Please see [email or web address] for schedule) °Team Phone [phone]  ° ° °Greater than 50%  of this time was spent counseling and coordinating care related to the above assessment and plan   °

## 2021-03-31 NOTE — Progress Notes (Signed)
Pt  complains of bad itching and is wondering if she can have Benadryl. Provider notified.

## 2021-04-01 DIAGNOSIS — I5023 Acute on chronic systolic (congestive) heart failure: Secondary | ICD-10-CM | POA: Diagnosis not present

## 2021-04-01 LAB — BASIC METABOLIC PANEL
Anion gap: 10 (ref 5–15)
BUN: 10 mg/dL (ref 8–23)
CO2: 27 mmol/L (ref 22–32)
Calcium: 8.8 mg/dL — ABNORMAL LOW (ref 8.9–10.3)
Chloride: 96 mmol/L — ABNORMAL LOW (ref 98–111)
Creatinine, Ser: 0.73 mg/dL (ref 0.44–1.00)
GFR, Estimated: >60 mL/min (ref >60)
Glucose, Bld: 128 mg/dL — ABNORMAL HIGH (ref 70–99)
Potassium: 3.9 mmol/L (ref 3.5–5.1)
Sodium: 133 mmol/L — ABNORMAL LOW (ref 135–145)

## 2021-04-01 LAB — CBC
HCT: 36.7 % (ref 36.0–46.0)
Hemoglobin: 12.5 g/dL (ref 12.0–15.0)
MCH: 33.7 pg (ref 26.0–34.0)
MCHC: 34.1 g/dL (ref 30.0–36.0)
MCV: 98.9 fL (ref 80.0–100.0)
Platelets: 265 10*3/uL (ref 150–400)
RBC: 3.71 MIL/uL — ABNORMAL LOW (ref 3.87–5.11)
RDW: 12.2 % (ref 11.5–15.5)
WBC: 11.6 10*3/uL — ABNORMAL HIGH (ref 4.0–10.5)
nRBC: 0 % (ref 0.0–0.2)

## 2021-04-01 MED ORDER — ISOSORBIDE MONONITRATE ER 30 MG PO TB24
30.0000 mg | ORAL_TABLET | Freq: Every day | ORAL | Status: AC
Start: 2021-04-01 — End: ?

## 2021-04-01 MED ORDER — DIGOXIN 62.5 MCG PO TABS
0.0625 mg | ORAL_TABLET | Freq: Every day | ORAL | Status: AC
Start: 2021-04-01 — End: ?

## 2021-04-01 MED ORDER — FUROSEMIDE 10 MG/ML IJ SOLN
80.0000 mg | Freq: Once | INTRAMUSCULAR | Status: AC
  Administered 2021-04-01: 80 mg via INTRAVENOUS
  Filled 2021-04-01: qty 8

## 2021-04-01 MED ORDER — MORPHINE SULFATE (CONCENTRATE) 10 MG/0.5ML PO SOLN: 5.0000 mg | ORAL | 0 refills | Status: AC | PRN

## 2021-04-01 MED ORDER — MORPHINE SULFATE (CONCENTRATE) 10 MG/0.5ML PO SOLN: 5.0000 mg | Freq: Three times a day (TID) | ORAL | 0 refills | Status: AC

## 2021-04-01 MED ORDER — FUROSEMIDE 10 MG/ML IJ SOLN: 20.0000 mg | Freq: Once | INTRAMUSCULAR | Status: DC

## 2021-04-01 MED ORDER — MORPHINE SULFATE (PF) 2 MG/ML IV SOLN: 2.0000 mg | INTRAVENOUS | 0 refills | Status: AC | PRN

## 2021-04-01 MED ORDER — POTASSIUM CHLORIDE CRYS ER 10 MEQ PO TBCR
20.0000 meq | EXTENDED_RELEASE_TABLET | Freq: Once | ORAL | Status: AC
  Administered 2021-04-01: 20 meq via ORAL
  Filled 2021-04-01: qty 2

## 2021-04-01 NOTE — TOC Transition Note (Signed)
Transition of Care [facility]) - CM/SW Discharge Note   Patient Details  Name: Robin Rose MRN: [id number] Date of Birth: [birth year]  Transition of Care [facility] CM/SW Contact:  Keyaria Lawson, LCSW Phone Number: 04/01/2021, 10:24 AM   Clinical Narrative:    Patient will DC to: [facility] Anticipated DC date: 04/01/2021 Family notified: Yes Transport by: [facility]   Per MD patient ready for DC to [facility]. RN to call report prior to discharge [phone] Rm#[id number]). RN, patient, patient's family, and facility notified of DC. Discharge Summary and FL2 sent to facility. DC packet on chart. Ambulance transport requested for patient.   CSW will sign off for now as social work intervention is no longer needed. Please consult [place] again if new needs arise.     Final next level of care: [place] Barriers to Discharge: No Barriers Identified   Patient Goals and CMS Choice        Discharge Placement                Patient to be transferred to facility by: [facility] Name of family member notified: [name], Sister   [phone] Patient and family notified of of transfer: 04/01/21  Discharge Plan and Services In-house Referral: Clinical Social Work Discharge Planning Services: CM Consult                                 Social Determinants of Health (SDOH) Interventions     Readmission Risk Interventions No flowsheet data found.   Naba Sneed, MSW, [place] Heart Failure Social Worker

## 2021-04-01 NOTE — Progress Notes (Addendum)
Advanced Heart Failure Rounding Note  PCP-Cardiologist: Quay Burow, MD   Subjective:   1/16 S/P LHC - critical 3V disease   RPAV lesion is 100% stenosed.   Mid LAD lesion is 95% stenosed.   Ost Cx to Prox Cx lesion is 95% stenosed.   Dist Cx lesion is 60% stenosed.   Prox RCA lesion is 90% stenosed.   Mid RCA lesion is 100% stenosed.   Ost LAD lesion is 99% stenosed.   Mid Cx lesion is 90% stenosed.   Ost 1st Mrg lesion is 90% stenosed.   Ost 2nd Diag to 2nd Diag lesion is 80% stenosed.   The left ventricular ejection fraction is less than 25% by visual estimate.  Findings:  Ao =114/66 (86) LV = 118/25 RA =  2 RV = 48/5 PA = 48/24 (34) PCW = 23 Fick cardiac output/index = 2.4/1.6 PVR = 4.6 WU Ao sat = 97% PA sat = 54%  1/17 elected full hospice. Awaiting bed at [facility] facility in [place].  Currently CP free. Feels "anxious". Denies dyspnea but still unable to lay flat w/ orthopnea. Sinus tach 120s on tele. Bilateral basilar crackles on exam.    Objective:   Weight Range: 47.6 kg Body mass index is 18.6 kg/m.   Vital Signs:   Temp:  [97.5 F (36.4 C)-98 F (36.7 C)] 98 F (36.7 C) (01/19 0335) Pulse Rate:  [88-125] 125 (01/19 0335) Resp:  [19-27] 22 (01/19 0335) BP: (106-123)/(61-76) 123/76 (01/19 0335) SpO2:  [97 %-99 %] 98 % (01/19 0335) Weight:  [47.6 kg] 47.6 kg (01/19 0057) Last BM Date: 03/31/21  Weight change: Filed Weights   03/30/21 1832 03/31/21 0017 04/01/21 0057  Weight: 47.3 kg 47.4 kg 47.6 kg    Intake/Output:   Intake/Output Summary (Last 24 hours) at 04/01/2021 0728 Last data filed at 04/01/2021 0453 Gross per 24 hour  Intake 1110.17 ml  Output 1225 ml  Net -114.83 ml      Physical Exam   General:  thin/cachetic appearing female, looks much older than actual age No respiratory difficulty, sitting up in bed HEENT: normal Neck: supple. no JVD. Carotids 2+ bilat; no bruits. No lymphadenopathy or thyromegaly  appreciated. Cor: PMI nondisplaced. Regular rhythm, tachy rate. No rubs, gallops or murmurs. Lungs: bibasilar crackles, no wheezing  Abdomen: soft, nontender, nondistended. No hepatosplenomegaly. No bruits or masses. Good bowel sounds. Extremities: no cyanosis, clubbing, rash, edema, LEs cool  Neuro: alert & oriented x 3, cranial nerves grossly intact. moves all 4 extremities w/o difficulty. Affect pleasant.   Telemetry   Sinus tach 120s   EKG   No new EKG to review   Labs    CBC Recent Labs    03/29/21 0932 03/29/21 1843 03/31/21 0359 04/01/21 0347  WBC 13.3*   < > 10.3 11.6*  NEUTROABS 10.4*  --   --   --   HGB 14.2   < > 13.0 12.5  HCT 41.4   < > 37.1 36.7  MCV 98.1   < > 97.6 98.9  PLT 295   < > 246 265   < > = values in this interval not displayed.   Basic Metabolic Panel Recent Labs    03/30/21 0508 03/31/21 0359 04/01/21 0347  NA 133* 133* 133*  K 3.2* 3.9 3.9  CL 91* 96* 96*  CO2 28 29 27   GLUCOSE 104* 126* 128*  BUN 8 12 10   CREATININE 0.82 0.74 0.73  CALCIUM 8.9 8.8* 8.8*  MG 1.8 2.1  --    Liver Function Tests No results for input(s): AST, ALT, ALKPHOS, BILITOT, PROT, ALBUMIN in the last 72 hours. No results for input(s): LIPASE, AMYLASE in the last 72 hours. Cardiac Enzymes No results for input(s): CKTOTAL, CKMB, CKMBINDEX, TROPONINI in the last 72 hours.  BNP: BNP (last 3 results) Recent Labs    03/16/21 1535 03/29/21 1257  BNP 648.7* 1,462.5*    ProBNP (last 3 results) No results for input(s): PROBNP in the last 8760 hours.   D-Dimer No results for input(s): DDIMER in the last 72 hours. Hemoglobin A1C No results for input(s): HGBA1C in the last 72 hours. Fasting Lipid Panel Recent Labs    03/30/21 0826  CHOL 150  HDL 42  LDLCALC 84  TRIG 118  CHOLHDL 3.6   Thyroid Function Tests No results for input(s): TSH, T4TOTAL, T3FREE, THYROIDAB in the last 72 hours.  Invalid input(s): FREET3  Other results:   Imaging     No results found.   Medications:     Scheduled Medications:  Chlorhexidine Gluconate Cloth  6 each Topical Daily   digoxin  0.0625 mg Oral Daily   isosorbide mononitrate  30 mg Oral Daily   mouth rinse  15 mL Mouth Rinse BID   morphine CONCENTRATE  5 mg Oral TID   sodium chloride flush  10-40 mL Intracatheter Q12H   sodium chloride flush  3 mL Intravenous Q12H   sodium chloride flush  3 mL Intravenous Q12H    Infusions:  sodium chloride Stopped (03/30/21 1807)    PRN Medications: sodium chloride, acetaminophen, acetaminophen, diphenhydrAMINE, fentaNYL (SUBLIMAZE) injection, guaiFENesin-dextromethorphan, lip balm, LORazepam, morphine CONCENTRATE **OR** morphine injection, nitroGLYCERIN, ondansetron (ZOFRAN) IV, sodium chloride flush, sodium chloride flush, traZODone    Patient Profile   Robin Rose is a 66 y.o. female with h/o tobacco abuse, breast cancer (s/p chemo and left mastectomy), CAD, and systolic HF.   Presented to with chest pain.  Assessment/Plan   Chest Pain --> NSTEMI -HS Trop 354 -> pending  -s/p NSTEM 1/19/ LHC 03/2017  Severe 3v CAD unchanged from previous. - No obvious culprit lesion for NSTEMI - continue ASA/statin -On admit rushing chest pain this morning.  - 1/16 S/P LHC  . 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 Not a candidate for CABG due to size and severe lung disease. PCI would be very high risk and require Impella support but has no access for that.  - Dr Haroldine Laws d/w  CT surgery . No options for intervention and has elected Hospice.  - Continue Imdur + Morphine for pain.   2. A/C HFrEF , ICM  -Failed milrinone attempt in 03/2017 due to worsening tachycardia - Not candidate for advanced therapies and has failed inotropes. We offered her an evaluation at [facility] for 2nd opinion but she has declined. -Echo 2022 EF 25-30%.  - B/L crackles on lung exam, +  orthopnea. Give 20 mg IV Lasix for decongestion/comfort  -Continue digoxin. Dig level 0.3 - Continue ivabradine 7.5 mg twice a day .    3. Hypokalemia - K 3.9  - Supp K w/ IV Lasix    4. Breast Cancer, left - Triple negative. S/p Adjuvant chemotherapy followed by left mastectomy and LN dissection 2/18 and XRT.    5. Tobacco Abuse - not interested in quitting  6. DNR/DNI -Palliative Care appreciated. She has elected Hospice.  -Trying to coordinate home versus  residential.   7. Anxiety - continue ativan      Length of Stay: [id number] Ladoris Gene  04/01/2021, 7:28 AM  Advanced Heart Failure Team Pager [phone] (M-F; 7a - 5p)  Please contact [facility] for night-coverage after hours (5p -7a ) and weekends on [email or web address]  Patient seen and examined with the above-signed Advanced Practice Provider and/or Housestaff. I personally reviewed laboratory data, imaging studies and relevant notes. I independently examined the patient and formulated the important aspects of the plan. I have edited the note to reflect any of my changes or salient points. I have personally discussed the plan with the patient and/or family.  Remains very tenuous. No CP but having orthopnea and PND.   Has decided on residential Hospice to help with symptom management   General:  Weak appearing. No resp difficulty HEENT: normal Neck: supple. JVP to jaw  Carotids 2+ bilat; no bruits. No lymphadenopathy or thryomegaly appreciated. Cor: PMI nondisplaced. Regular rate & rhythm. No rubs, gallops or murmurs. Lungs: + crackles Abdomen: soft, nontender, nondistended. No hepatosplenomegaly. No bruits or masses. Good bowel sounds. Extremities: no cyanosis, clubbing, rash, edema Neuro: alert & orientedx3, cranial nerves grossly intact. moves all 4 extremities w/o difficulty. Affect pleasant  I reviewed situation with IC team again today. And really no options for PCI. She is uninterested in any more procedures.  Will give 80 IV lasix this am. Plan transfer to residential Hospice when bed available. Will need aggressive symptom management with nitrates, morphine and diuretics.   [name], MD  8:45 AM

## 2021-04-01 NOTE — Progress Notes (Signed)
Sherrie Therapist, sports at [facility] just called me back and report was given to her. Pt left alert and oriented x 4, room air, denies pain. Pt was given IMDUR 30 mg, PO potassium CR 20 mEq, guaifenesin-dextromethorphan 100-10 mg.and digoxin 0.0625 mg.  Gave patient a pad to place in her panties in case she voids before reaching Hospice.   Pt thanked staff and said she loved [place]. I told her I loved her too. Her son took her belongings and stated he will follow ambulance.

## 2021-04-01 NOTE — Care Management Important Message (Signed)
Important Message  Patient Details  Name: Robin Rose MRN: [id number] Date of Birth: 06-24-1955   Medicare Important Message Given:  Yes     Shelda Altes 04/01/2021, 10:10 AM

## 2021-04-01 NOTE — Progress Notes (Signed)
Attempted to call report twice to [facility] but nurses are still busy. Gave my number to receptionist/secretary so that the nurse could call me back.

## 2021-04-01 NOTE — Progress Notes (Signed)
PT Cancellation & Discharge Note  Patient Details Name: Robin Rose MRN: [id number] DOB: 01-14-1956   Cancelled Treatment:    Reason Eval/Treat Not Completed: Patient declined, no reason specified;Other (comment). Chart and pt and family reporting she is planning to d/c to residential hospice with comfort care goals. Discussed goals of PT and offered PT services if pt desired, but pt reporting politely "I do not see a point in it" with wishes to focus just on comfort. Per pt request, PT will sign off.   Moishe Spice, PT, DPT Acute Rehabilitation Services  Pager: [phone] Office: [place] 04/01/2021, 9:12 AM

## 2021-04-01 NOTE — Progress Notes (Signed)
° °  This referral was received from [facility] with [facility] department for the [facility] facility. I have worked up this referral and talked with our MD at the facility. He has approved the pt for services at the [facility]. I have discussed the transition from a more aggressive approach to a comfort care approach.  The sister and pt are in agreement with hospice services. We do have a bed to offer today and the pt's sister will sign paperwork for the pt transfer to the [facility].   I will reach out to the [facility] and let them know what time the pt can come today after she speaks with MD and they are in agreement for pt to d/c.  Webb Silversmith RN [phone]

## 2021-04-14 DEATH — deceased

## 2021-05-03 ENCOUNTER — Other Ambulatory Visit: Payer: Medicare Other

## 2021-05-03 ENCOUNTER — Ambulatory Visit: Payer: Medicare Other | Admitting: Hematology and Oncology

## 2021-05-05 ENCOUNTER — Telehealth (HOSPITAL_COMMUNITY): Payer: Self-pay | Admitting: Internal Medicine

## 2021-05-05 NOTE — Telephone Encounter (Signed)
Pt sister [name] faxed over insurance forms on 04/20/21 that DB need to sign, she can be reached @ [phone]. Thanks

## 2021-05-07 NOTE — Telephone Encounter (Signed)
Papers received and completed, awaiting Dr Bensimhon's review and signature

## 2021-05-13 NOTE — Telephone Encounter (Signed)
Forms completed, signed by Dr Haroldine Laws, and mailed to pt's sister ?

## 2021-06-10 ENCOUNTER — Encounter (HOSPITAL_COMMUNITY): Payer: PRIVATE HEALTH INSURANCE | Admitting: Internal Medicine

## 2024-01-16 IMAGING — DX DG CHEST 2V
2 series · 2 of 2 positions shown · non-contrast
Comparison: 05/29/2020

CLINICAL DATA: Chest pain.

EXAM:
CHEST - 2 VIEW

[chest lat]
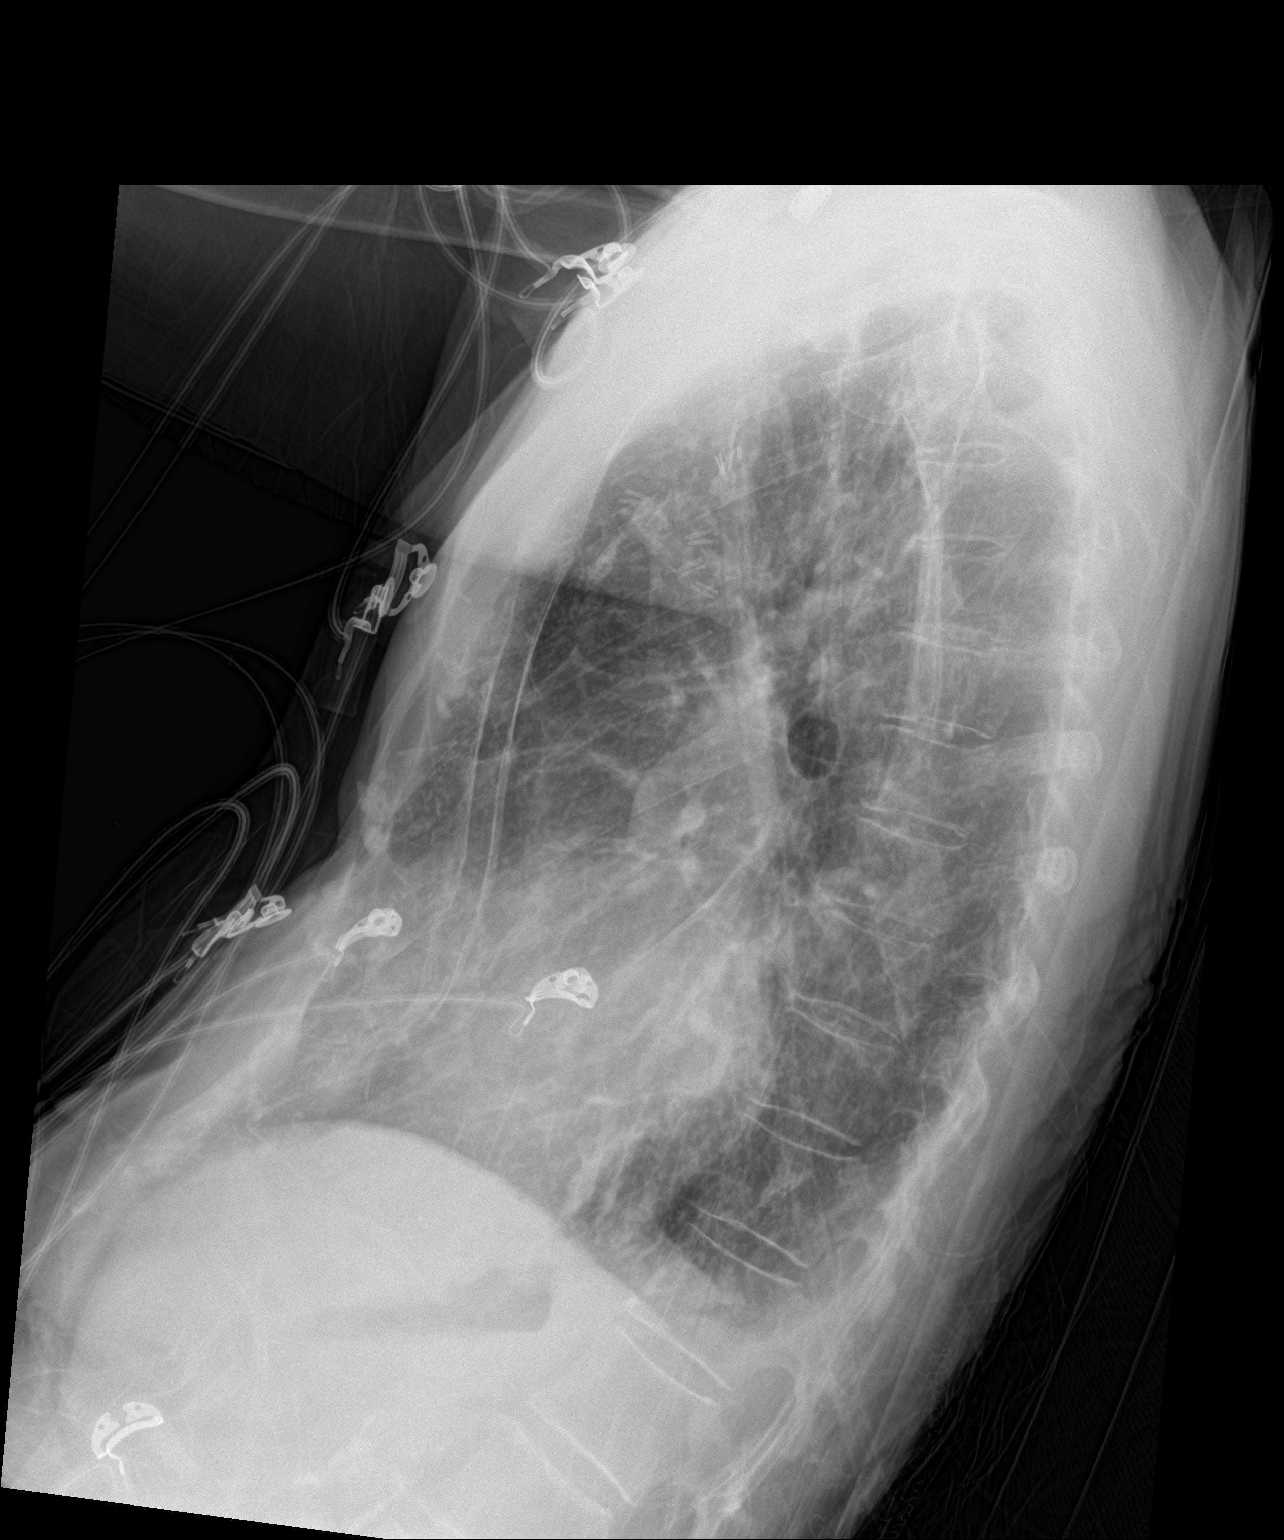

[chest ap]
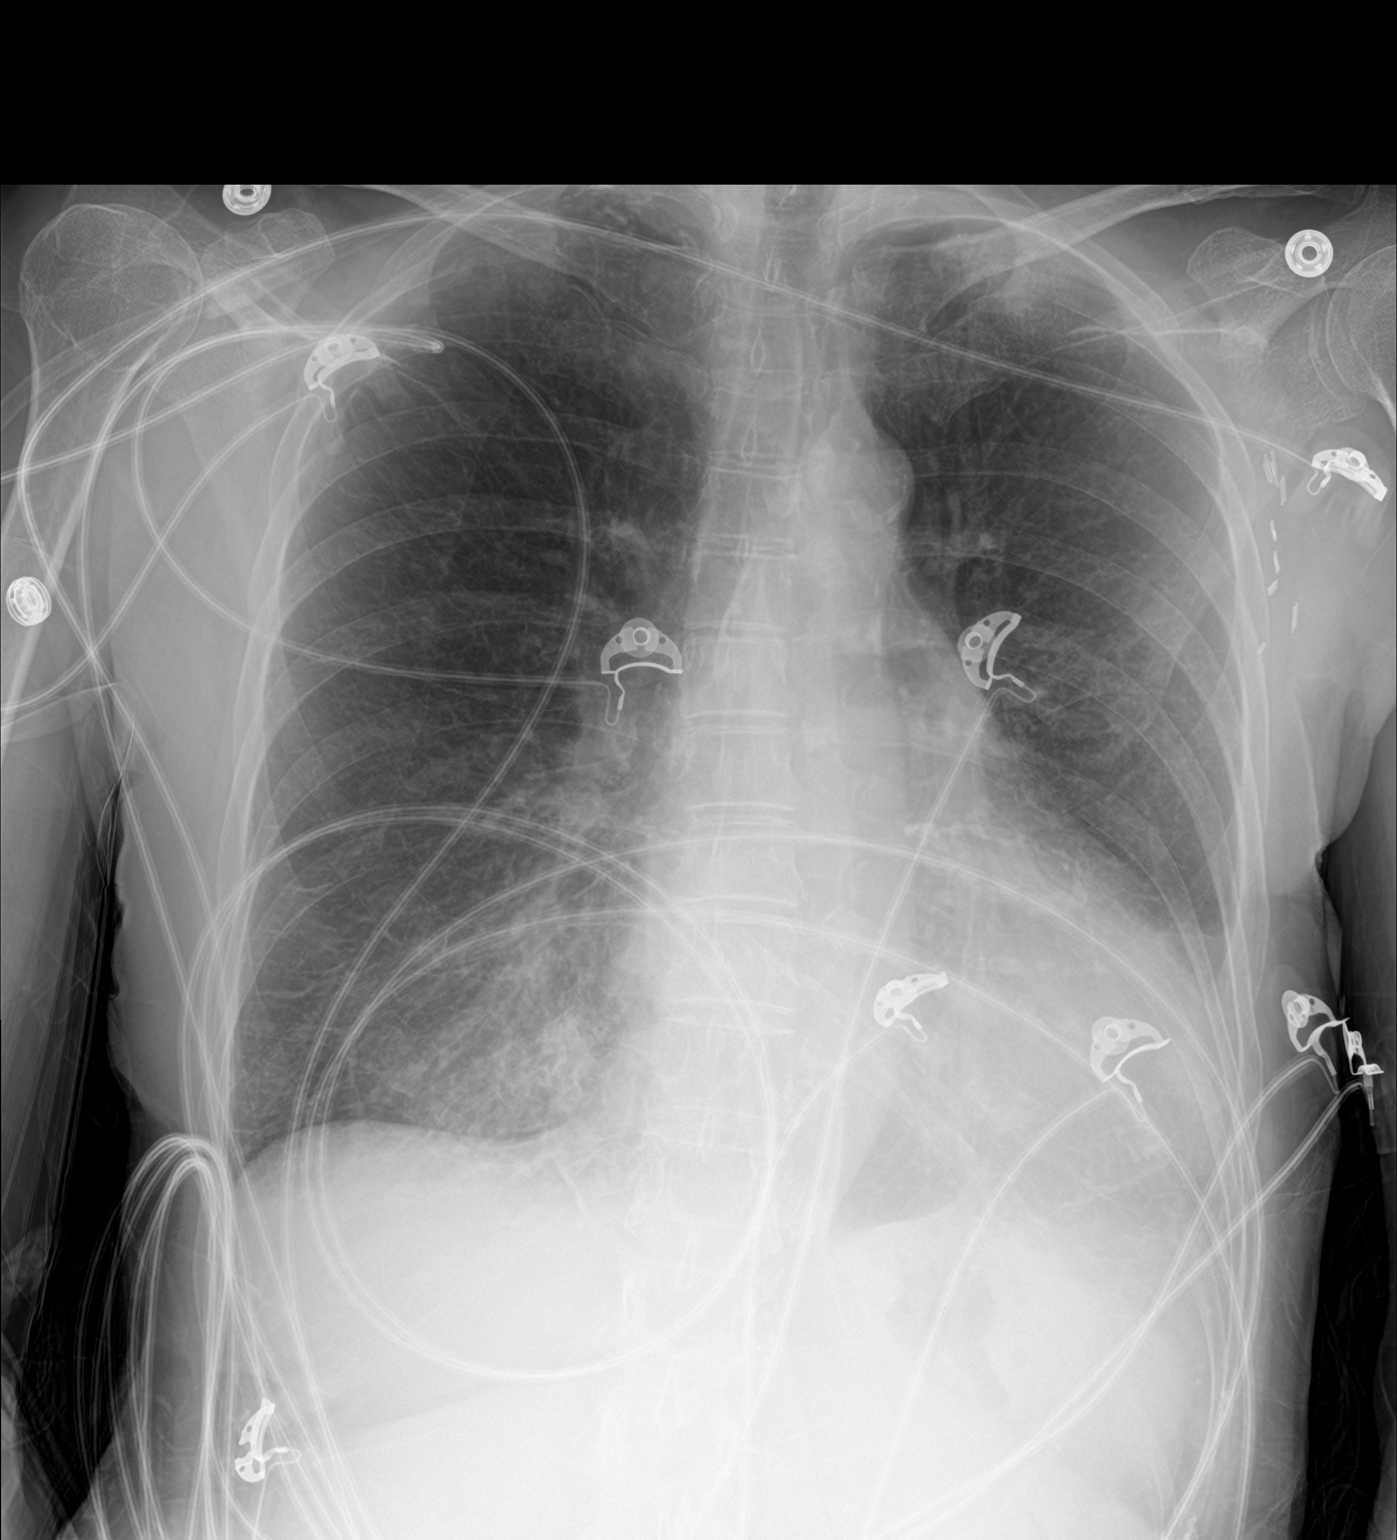

[2 of 2 positions shown; findings below may reference images not displayed]

FINDINGS: There is again pectus excavatum. Mild biapical pleural thickening is
similar to prior. Chronic right greater than left apical pleural
calcifications as better seen on prior CT.

Cardiac silhouette is again mildly to moderately enlarged.
Mediastinal contours are within normal limits with mild
calcification within the aortic arch. New blunting of the posterior
costophrenic angle on lateral view with mild basilar heterogeneous
airspace opacity. No pneumothorax.

Mild multilevel degenerative disc changes of the thoracic spine.
IMPRESSION: Likely new tiny pleural effusions with mild basilar airspace opacity
may represent atelectasis. Cannot exclude underlying pneumonia.
